# Patient Record
Sex: Female | Born: 1965 | Race: White | Hispanic: No | Marital: Married | Smoking: Former smoker
Health system: Southern US, Community
[De-identification: ages and names within clinical notes are randomized; demographics above are authoritative.]

## PROBLEM LIST (undated history)

## (undated) DIAGNOSIS — M1990 Unspecified osteoarthritis, unspecified site: Secondary | ICD-10-CM

## (undated) DIAGNOSIS — Z8679 Personal history of other diseases of the circulatory system: Secondary | ICD-10-CM

## (undated) DIAGNOSIS — Z803 Family history of malignant neoplasm of breast: Secondary | ICD-10-CM

## (undated) DIAGNOSIS — Z9889 Other specified postprocedural states: Secondary | ICD-10-CM

## (undated) DIAGNOSIS — F32A Depression, unspecified: Secondary | ICD-10-CM

## (undated) DIAGNOSIS — F329 Major depressive disorder, single episode, unspecified: Secondary | ICD-10-CM

## (undated) DIAGNOSIS — J45909 Unspecified asthma, uncomplicated: Secondary | ICD-10-CM

## (undated) DIAGNOSIS — A630 Anogenital (venereal) warts: Secondary | ICD-10-CM

## (undated) DIAGNOSIS — Z87891 Personal history of nicotine dependence: Secondary | ICD-10-CM

## (undated) DIAGNOSIS — N879 Dysplasia of cervix uteri, unspecified: Secondary | ICD-10-CM

## (undated) DIAGNOSIS — I10 Essential (primary) hypertension: Secondary | ICD-10-CM

## (undated) DIAGNOSIS — F419 Anxiety disorder, unspecified: Secondary | ICD-10-CM

## (undated) DIAGNOSIS — K219 Gastro-esophageal reflux disease without esophagitis: Secondary | ICD-10-CM

## (undated) DIAGNOSIS — Z923 Personal history of irradiation: Secondary | ICD-10-CM

## (undated) DIAGNOSIS — R112 Nausea with vomiting, unspecified: Secondary | ICD-10-CM

## (undated) HISTORY — DX: Depression, unspecified: F32.A

## (undated) HISTORY — DX: Dysplasia of cervix uteri, unspecified: N87.9

## (undated) HISTORY — DX: Family history of malignant neoplasm of breast: Z80.3

## (undated) HISTORY — DX: Gastro-esophageal reflux disease without esophagitis: K21.9

## (undated) HISTORY — PX: KNEE SURGERY: SHX244

## (undated) HISTORY — DX: Anogenital (venereal) warts: A63.0

## (undated) HISTORY — DX: Major depressive disorder, single episode, unspecified: F32.9

## (undated) HISTORY — PX: TUBAL LIGATION: SHX77

## (undated) HISTORY — DX: Personal history of nicotine dependence: Z87.891

## (undated) HISTORY — PX: JOINT REPLACEMENT: SHX530

---

## 1988-06-18 DIAGNOSIS — N879 Dysplasia of cervix uteri, unspecified: Secondary | ICD-10-CM

## 1988-06-18 HISTORY — DX: Dysplasia of cervix uteri, unspecified: N87.9

## 1988-06-18 HISTORY — PX: CERVIX LESION DESTRUCTION: SHX591

## 1998-10-19 ENCOUNTER — Encounter: Payer: Self-pay | Admitting: Obstetrics and Gynecology

## 1998-10-19 ENCOUNTER — Ambulatory Visit (HOSPITAL_COMMUNITY): Admission: RE | Admit: 1998-10-19 | Discharge: 1998-10-19 | Payer: Self-pay | Admitting: *Deleted

## 1999-02-23 ENCOUNTER — Encounter (HOSPITAL_COMMUNITY): Admission: RE | Admit: 1999-02-23 | Discharge: 1999-03-31 | Payer: Self-pay | Admitting: Obstetrics and Gynecology

## 1999-03-07 ENCOUNTER — Encounter: Payer: Self-pay | Admitting: Obstetrics and Gynecology

## 1999-03-07 ENCOUNTER — Ambulatory Visit (HOSPITAL_COMMUNITY): Admission: RE | Admit: 1999-03-07 | Discharge: 1999-03-07 | Payer: Self-pay | Admitting: Obstetrics and Gynecology

## 1999-03-30 ENCOUNTER — Inpatient Hospital Stay (HOSPITAL_COMMUNITY): Admission: AD | Admit: 1999-03-30 | Discharge: 1999-04-01 | Payer: Self-pay | Admitting: Obstetrics and Gynecology

## 1999-03-30 ENCOUNTER — Encounter (INDEPENDENT_AMBULATORY_CARE_PROVIDER_SITE_OTHER): Payer: Self-pay

## 2000-01-04 ENCOUNTER — Other Ambulatory Visit: Admission: RE | Admit: 2000-01-04 | Discharge: 2000-01-04 | Payer: Self-pay | Admitting: Obstetrics and Gynecology

## 2007-07-07 ENCOUNTER — Ambulatory Visit: Payer: Self-pay | Admitting: Cardiology

## 2007-07-11 ENCOUNTER — Ambulatory Visit: Payer: Self-pay | Admitting: Cardiology

## 2008-06-26 LAB — HM PAP SMEAR: HM Pap smear: NEGATIVE

## 2009-11-18 ENCOUNTER — Encounter: Admission: RE | Admit: 2009-11-18 | Discharge: 2009-11-18 | Payer: Self-pay | Admitting: Family Medicine

## 2010-10-10 DIAGNOSIS — F411 Generalized anxiety disorder: Secondary | ICD-10-CM | POA: Insufficient documentation

## 2010-10-11 DIAGNOSIS — N946 Dysmenorrhea, unspecified: Secondary | ICD-10-CM | POA: Insufficient documentation

## 2010-10-23 DIAGNOSIS — F339 Major depressive disorder, recurrent, unspecified: Secondary | ICD-10-CM | POA: Insufficient documentation

## 2010-10-31 NOTE — Letter (Signed)
July 07, 2007    Tammy R. Collins Scotland, M.D.  [address], [place] [address]   RE:  RUDIE, RIKARD  MRN:  [id number]  /  DOB:  12-19-1965   Dear Dr. Collins Scotland:   Thank you for your referral of Ms. Monia Pouch.  As you know she is a pleasant  45 year old woman with a history of anxiety and gastroesophageal reflux  disease.  She has an intermittent history of palpitations.  She reports  that back in October of 2007 she began to experience episodes of racing  heart beat, as if her heart was jumping out of her chest.  This would  be associated often with a flushed tingling sensation, anxiety and  sometimes dizziness, although she has never had any syncope associated  with it.  Sometimes she will feel a sharp chest pain and burning  feeling in her chest as well.  She was treated for anxiety initially  back in 2007 and continues on Xanax and paroxetine.  She has had  recurrence of symptoms beginning back in October 2008 of similar  character and quality.  Her electrocardiogram today shows sinus rhythm  with nonspecific T-wave changes.  She has an incomplete right bundle  branch block pattern in V1 and V2.  PR interval is normal at 136  milliseconds.  There is no family history of sudden cardiac death or  dysrhythmia.   ALLERGIES:  ASPIRIN AND CODEINE.   PRESENT MEDICATIONS:  1. Xanax 0.25 mg p.o. t.i.d.  2. Paxil 20 mg p.o. daily.  3,  Zegerid 40 mg p.o. daily.   REVIEW OF SYSTEMS:  Discussed above.  Otherwise systems are negative.   Ms. Lasser states that her father developed heart disease in his 47s as  well as diabetes mellitus.  The patient's mother recently underwent  stent placement presumably due to cardiovascular disease at age 65.  She has a brother and a sister, both described as healthy.   SOCIAL HISTORY:  Finds that Ms. Salahuddin is married, has 2 children.  She  smokes up to a half pack per day tobacco and has done so since age 33.  She denies any illicit substance use.   Does drink alcohol occasionally.  She has no regular exercise regimen.  She works in Engineering geologist.   EXAMINATION:  Ms. Terada is comfortable in no acute distress.  She weighs  170 pounds, blood pressure 133/85, heart rate 67.  HEENT:  Conjunctiva, lids normal.  oropharynx is clear.  NECK:  Supple.  No elevated jugular venous pressure, no loud bruits, no  thyromegaly or thyroid tenderness.  LUNGS:  Clear without labored breathing.  CARDIAC:  Exam reveals a regular rate and rhythm, No S3 or S4 gallop, no  loud murmur, no midsystolic click.  ABDOMEN:  Soft, nontender, normoactive bowel sounds.  EXTREMITIES:  Exhibit no pitting edema, distal pulses are 2+.  SKIN:  Warm and dry.  MUSCULOSKELETAL:  No kyphosis noted.  NEUROPSYCHIATRIC:  Patient is alert x3.  Affect is normal.   IMPRESSION AND RECOMMENDATIONS:  Ms. Vandemark is a pleasant 45 year old  woman with intermittent palpitations as outlined above.  The possibility  of paroxysmal supraventricular tachycardia is considered, although I  have no objective evidence of this at the present time.  Her  electrocardiogram shows normal intervals and no obvious evidence of pre-  excitation.  There is an incomplete right bundle branch block pattern  which may be of no major clinical significance.  We discussed using a 30-  day event recorder to try to document if she is in fact having any  rhythm change or not with her associated symptoms.  She reports having  spells approximately once every 1-2 weeks at this time.  I have not  added any new medications at this point and will plan to see her back in  1 month's time to review the monitor.  We can then proceed from there.    Sincerely,      Jonelle Sidle, MD  Electronically Signed    SGM/MedQ  DD: 07/07/2007  DT: 07/07/2007  Job #: [id number]

## 2011-11-08 ENCOUNTER — Ambulatory Visit (INDEPENDENT_AMBULATORY_CARE_PROVIDER_SITE_OTHER): Payer: Private Health Insurance - Indemnity | Admitting: Family Medicine

## 2011-11-08 ENCOUNTER — Ambulatory Visit: Payer: Private Health Insurance - Indemnity

## 2011-11-08 VITALS — BP 118/72 | HR 96 | Temp 98.2°F | Resp 18 | Ht 68.5 in | Wt 201.0 lb

## 2011-11-08 DIAGNOSIS — M25569 Pain in unspecified knee: Secondary | ICD-10-CM

## 2011-11-08 MED ORDER — TRAMADOL HCL 50 MG PO TABS: 50.0000 mg | ORAL_TABLET | Freq: Three times a day (TID) | ORAL | Status: AC | PRN

## 2011-11-08 MED ORDER — MELOXICAM 7.5 MG PO TABS: 7.5000 mg | ORAL_TABLET | Freq: Two times a day (BID) | ORAL | Status: DC

## 2011-11-08 NOTE — Progress Notes (Signed)
  Subjective:    Patient ID: Teresa Dunn, female    DOB: June 15, 1966, 45 y.o.   MRN: [id number]  HPI  Patient presents complaining of (L) knee pain that started early this morning. Very painful to weight bear. No known injury though was dancing last night.  Review of Systems     Objective:   Physical Exam  Constitutional: She appears well-developed.  Cardiovascular: Normal rate, regular rhythm and normal heart sounds.   Pulmonary/Chest: Effort normal and breath sounds normal.  Musculoskeletal:       Left knee: She exhibits no MCL laxity. tenderness found. Medial joint line and MCL tenderness noted. No lateral joint line, no LCL and no patellar tendon tenderness noted.        Assessment & Plan:  (L) knee MCL sprain; question meniscal involvement  See medications on AVS Patient states she has crutches at home; encouraged her to bring them in so we can adjust. Follow up in 10-14 days Work note provided

## 2012-06-26 ENCOUNTER — Encounter: Payer: Self-pay | Admitting: Family Medicine

## 2012-06-26 ENCOUNTER — Ambulatory Visit (INDEPENDENT_AMBULATORY_CARE_PROVIDER_SITE_OTHER): Payer: BC Managed Care – PPO | Admitting: Family Medicine

## 2012-06-26 VITALS — BP 140/90 | HR 72 | Temp 98.4°F | Resp 12 | Wt 209.0 lb

## 2012-06-26 DIAGNOSIS — R519 Headache, unspecified: Secondary | ICD-10-CM

## 2012-06-26 DIAGNOSIS — K219 Gastro-esophageal reflux disease without esophagitis: Secondary | ICD-10-CM | POA: Insufficient documentation

## 2012-06-26 DIAGNOSIS — Z8659 Personal history of other mental and behavioral disorders: Secondary | ICD-10-CM

## 2012-06-26 DIAGNOSIS — R51 Headache: Secondary | ICD-10-CM

## 2012-06-26 MED ORDER — NORTRIPTYLINE HCL 10 MG PO CAPS
10.0000 mg | ORAL_CAPSULE | Freq: Every day | ORAL | Status: DC
Start: 2012-06-26 — End: 2012-08-07

## 2012-06-26 NOTE — Progress Notes (Signed)
  Subjective:    Patient ID: Teresa Dunn, female    DOB: Dec 11, 1965, 47 y.o.   MRN: [id number]  HPI  New patient to establish care. Past medical history reviewed. Past history of GERD, depression, reported genital warts. Major issue is that she's had chronic headaches. She has headaches which appear to be a more of a mixed type. She has called these "migraines" but these are frequently bifrontal and spread occipitally. Occasional left parietal pains. Saw another provider and had MRI scan reportedly 2 months ago normal. Generally has chronic headaches 28 days out of the month. These are relatively constant. Quality is sharp to throbbing at times. Occasional nausea without vomiting. Occasional light sensitivity. Ibuprofen helps. Taking almost daily ibuprofen. Minimal caffeine use.  No clear triggers. Generally poor sleep quality. No other alleviating factors. No other clear exacerbating factors. No associated fever. No night sweats. No focal neurologic symptoms. No visual changes.  Past Medical History  Diagnosis Date  . Cancer 1988    cervical  . Depression   . Chronic headaches   . GERD (gastroesophageal reflux disease)   . Genital warts    Past Surgical History  Procedure Date  . Tubal ligation     reports that she quit smoking about 4 months ago. Her smoking use included Cigarettes. She has a 30 pack-year smoking history. She does not have any smokeless tobacco history on file. Her alcohol and drug histories not on file. family history includes Cancer in her maternal grandmother; Diabetes in her father and mother; Heart disease in her father and mother; Hypertension in her father and mother; Mental illness in her mother; and Stroke in her maternal grandmother. Allergies  Allergen Reactions  . Codeine Nausea And Vomiting  . Asa (Aspirin) Other (See Comments)    wheezing      Review of Systems  Constitutional: Negative for fever, chills, diaphoresis, activity change and unexpected  weight change.  HENT: Negative for congestion and sinus pressure.   Respiratory: Negative for cough and shortness of breath.   Cardiovascular: Negative for chest pain.  Gastrointestinal: Negative for abdominal pain.  Genitourinary: Negative for dysuria.  Neurological: Positive for headaches.  Psychiatric/Behavioral: Negative for confusion.       Objective:   Physical Exam  Constitutional: She is oriented to person, place, and time. She appears well-developed and well-nourished.  HENT:  Right Ear: External ear normal.  Left Ear: External ear normal.  Mouth/Throat: Oropharynx is clear and moist.  Eyes: Pupils are equal, round, and reactive to light.  Neck: Neck supple. No thyromegaly present.  Cardiovascular: Normal rate and regular rhythm.   Pulmonary/Chest: Effort normal and breath sounds normal. No respiratory distress. She has no wheezes. She has no rales.  Neurological: She is alert and oriented to person, place, and time. No cranial nerve deficit.          Assessment & Plan:  Chronic headaches. These appear to be a mixed type. Avoid regular use of over-the-counter analgesics. Continue low caffeine intake. Trial of nortriptyline 10 mg each bedtime and consider increasing to 20 mg after one week if no improvement. Reassess in one month.

## 2012-06-26 NOTE — Patient Instructions (Signed)
Migraine Headache A migraine headache is an intense, throbbing pain on one or both sides of your head. A migraine can last for 30 minutes to several hours. CAUSES  The exact cause of a migraine headache is not always known. However, a migraine may be caused when nerves in the brain become irritated and release chemicals that cause inflammation. This causes pain. SYMPTOMS  Pain on one or both sides of your head.  Pulsating or throbbing pain.  Severe pain that prevents daily activities.  Pain that is aggravated by any physical activity.  Nausea, vomiting, or both.  Dizziness.  Pain with exposure to bright lights, loud noises, or activity.  General sensitivity to bright lights, loud noises, or smells. Before you get a migraine, you may get warning signs that a migraine is coming (aura). An aura may include:  Seeing flashing lights.  Seeing bright spots, halos, or zig-zag lines.  Having tunnel vision or blurred vision.  Having feelings of numbness or tingling.  Having trouble talking.  Having muscle weakness. MIGRAINE TRIGGERS  Alcohol.  Smoking.  Stress.  Menstruation.  Aged cheeses.  Foods or drinks that contain nitrates, glutamate, aspartame, or tyramine.  Lack of sleep.  Chocolate.  Caffeine.  Hunger.  Physical exertion.  Fatigue.  Medicines used to treat chest pain (nitroglycerine), birth control pills, estrogen, and some blood pressure medicines. DIAGNOSIS  A migraine headache is often diagnosed based on:  Symptoms.  Physical examination.  A CT scan or MRI of your head. TREATMENT Medicines may be given for pain and nausea. Medicines can also be given to help prevent recurrent migraines.  HOME CARE INSTRUCTIONS  Only take over-the-counter or prescription medicines for pain or discomfort as directed by your caregiver. The use of long-term narcotics is not recommended.  Lie down in a dark, quiet room when you have a migraine.  Keep a journal  to find out what may trigger your migraine headaches. For example, write down:  What you eat and drink.  How much sleep you get.  Any change to your diet or medicines.  Limit alcohol consumption.  Quit smoking if you smoke.  Get 7 to 9 hours of sleep, or as recommended by your caregiver.  Limit stress.  Keep lights dim if bright lights bother you and make your migraines worse. SEEK IMMEDIATE MEDICAL CARE IF:   Your migraine becomes severe.  You have a fever.  You have a stiff neck.  You have vision loss.  You have muscular weakness or loss of muscle control.  You start losing your balance or have trouble walking.  You feel faint or pass out.  You have severe symptoms that are different from your first symptoms. MAKE SURE YOU:   Understand these instructions.  Will watch your condition.  Will get help right away if you are not doing well or get worse. Document Released: 06/04/2005 Document Revised: 08/27/2011 Document Reviewed: 05/25/2011 [facility] Patient Information 2013 [place], [place].  Try to avoid regular use of Ibuprofen Consider titrate nortriptyline up to 2 capsules at night in 4-5 days if not seeing improvement.

## 2012-07-31 ENCOUNTER — Ambulatory Visit: Payer: BC Managed Care – PPO | Admitting: Family Medicine

## 2012-08-07 ENCOUNTER — Ambulatory Visit (INDEPENDENT_AMBULATORY_CARE_PROVIDER_SITE_OTHER): Payer: BC Managed Care – PPO | Admitting: Family Medicine

## 2012-08-07 ENCOUNTER — Encounter: Payer: Self-pay | Admitting: Family Medicine

## 2012-08-07 VITALS — BP 158/84 | Temp 97.9°F | Wt 209.0 lb

## 2012-08-07 DIAGNOSIS — I10 Essential (primary) hypertension: Secondary | ICD-10-CM

## 2012-08-07 DIAGNOSIS — J309 Allergic rhinitis, unspecified: Secondary | ICD-10-CM

## 2012-08-07 DIAGNOSIS — G4489 Other headache syndrome: Secondary | ICD-10-CM | POA: Insufficient documentation

## 2012-08-07 DIAGNOSIS — J3089 Other allergic rhinitis: Secondary | ICD-10-CM | POA: Insufficient documentation

## 2012-08-07 MED ORDER — NORTRIPTYLINE HCL 10 MG PO CAPS: ORAL_CAPSULE | ORAL | Status: DC

## 2012-08-07 MED ORDER — HYDROCHLOROTHIAZIDE 25 MG PO TABS: ORAL_TABLET | ORAL | Status: DC

## 2012-08-07 MED ORDER — MONTELUKAST SODIUM 10 MG PO TABS: 10.0000 mg | ORAL_TABLET | Freq: Every day | ORAL | Status: DC

## 2012-08-07 NOTE — Progress Notes (Signed)
  Subjective:    Patient ID: Teresa Dunn, female    DOB: January 24, 1966, 47 y.o.   MRN: [id number]  HPI Patient is seen for followup regarding mixed type headache. We started low dose Pamelor 20 mg at night and her headaches have completely resolved with this. She is extremely happy with results. She does have some mild dry mouth otherwise no side effects. Her sleep has improved.  She requests refills of Singulair which she takes for allergies. Allergies have been well controlled  Blood pressure consistently elevated over the past couple months. 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 Usually around 170/90. No chest pains. No dizziness. Never treated for hypertension. No regular nonsteroidal use. No regular alcohol use. Strong family history of hypertension both parents  Past Medical History  Diagnosis Date  . Cancer 1988    cervical  . Depression   . Chronic headaches   . GERD (gastroesophageal reflux disease)   . Genital warts    Past Surgical History  Procedure Laterality Date  . Tubal ligation      reports that she quit smoking about 5 months ago. Her smoking use included Cigarettes. She has a 30 pack-year smoking history. She does not have any smokeless tobacco history on file. Her alcohol and drug histories are not on file. family history includes Cancer in her maternal grandmother; Diabetes in her father and mother; Heart disease in her father and mother; Hypertension in her father and mother; Mental illness in her mother; and Stroke in her maternal grandmother. Allergies  Allergen Reactions  . Codeine Nausea And Vomiting  . Asa (Aspirin) Other (See Comments)    wheezing      Review of Systems  Constitutional: Negative for appetite change, fatigue and unexpected weight change.  Eyes: Negative for visual disturbance.  Respiratory: Negative for cough, chest tightness, shortness of breath and wheezing.   Cardiovascular: Negative for  chest pain, palpitations and leg swelling.  Neurological: Negative for dizziness, seizures, syncope, weakness, light-headedness and headaches.       Objective:   Physical Exam  Constitutional: She appears well-developed and well-nourished.  Neck: Neck supple. No thyromegaly present.  Cardiovascular: Normal rate and regular rhythm.   Pulmonary/Chest: Effort normal and breath sounds normal. No respiratory distress. She has no wheezes. She has no rales.  Musculoskeletal: She exhibits no edema.          Assessment & Plan:  #1 chronic headaches. Mixed type. Greatly improved with Pamelor.  We discussed continuing this for another couple months then consider slow taper #2 hypertension. Currently untreated. Discussed lifestyle management. Start low-dose HCTZ 10/12.5 mg daily and reassess blood pressure one month. #3 perennial allergies. Controlled. Refill Singulair for one year

## 2012-08-07 NOTE — Patient Instructions (Addendum)

## 2012-09-04 ENCOUNTER — Ambulatory Visit: Payer: BC Managed Care – PPO | Admitting: Family Medicine

## 2012-09-09 ENCOUNTER — Encounter: Payer: Self-pay | Admitting: Family Medicine

## 2012-09-09 ENCOUNTER — Ambulatory Visit (INDEPENDENT_AMBULATORY_CARE_PROVIDER_SITE_OTHER): Payer: BC Managed Care – PPO | Admitting: Family Medicine

## 2012-09-09 VITALS — BP 122/78 | Temp 98.0°F | Wt 208.0 lb

## 2012-09-09 DIAGNOSIS — I10 Essential (primary) hypertension: Secondary | ICD-10-CM

## 2012-09-09 DIAGNOSIS — Z8659 Personal history of other mental and behavioral disorders: Secondary | ICD-10-CM

## 2012-09-09 LAB — BASIC METABOLIC PANEL
Calcium: 9.4 mg/dL (ref 8.4–10.5)
Creatinine, Ser: 0.6 mg/dL (ref 0.4–1.2)
GFR: 106.45 mL/min (ref >60.00)
Sodium: 137 mEq/L (ref 135–145)

## 2012-09-09 MED ORDER — DULOXETINE HCL 60 MG PO CPEP: 60.0000 mg | ORAL_CAPSULE | Freq: Every day | ORAL | Status: DC

## 2012-09-09 MED ORDER — DULOXETINE HCL 30 MG PO CPEP: 30.0000 mg | ORAL_CAPSULE | Freq: Every day | ORAL | Status: DC

## 2012-09-09 MED ORDER — HYDROCHLOROTHIAZIDE 25 MG PO TABS: ORAL_TABLET | ORAL | Status: DC

## 2012-09-09 NOTE — Progress Notes (Signed)
  Subjective:    Patient ID: Teresa Dunn, female    DOB: 05-23-1966, 47 y.o.   MRN: [id number]  HPI Followup hypertension. Added HCTZ 12.5 mg daily. Blood pressure improved but still some elevations and she titrated this to 25 mg daily Blood pressures have been very well controlled over the past couple of weeks. No side effects. No further headaches.  Prior history of anxiety and depression. She's had some mild recurrent symptoms recently. Responded very well to Cymbalta previously and is requesting going back on this.  Past Medical History  Diagnosis Date  . Cancer 1988    cervical  . Depression   . Chronic headaches   . GERD (gastroesophageal reflux disease)   . Genital warts    Past Surgical History  Procedure Laterality Date  . Tubal ligation      reports that she quit smoking about 6 months ago. Her smoking use included Cigarettes. She has a 30 pack-year smoking history. She does not have any smokeless tobacco history on file. Her alcohol and drug histories are not on file. family history includes Cancer in her maternal grandmother; Diabetes in her father and mother; Heart disease in her father and mother; Hypertension in her father and mother; Mental illness in her mother; and Stroke in her maternal grandmother. Allergies  Allergen Reactions  . Codeine Nausea And Vomiting  . Asa (Aspirin) Other (See Comments)    wheezing      Review of Systems  Constitutional: Negative for fatigue.  Eyes: Negative for visual disturbance.  Respiratory: Negative for cough, chest tightness, shortness of breath and wheezing.   Cardiovascular: Negative for chest pain, palpitations and leg swelling.  Neurological: Negative for dizziness, seizures, syncope, weakness, light-headedness and headaches.       Objective:   Physical Exam  Constitutional: She is oriented to person, place, and time. She appears well-developed and well-nourished.  Neck: Neck supple. No thyromegaly present.   Cardiovascular: Normal rate and regular rhythm.   Pulmonary/Chest: Effort normal and breath sounds normal. No respiratory distress. She has no wheezes. She has no rales.  Musculoskeletal: She exhibits no edema.  Neurological: She is alert and oriented to person, place, and time.  Psychiatric: She has a normal mood and affect. Her behavior is normal. Judgment normal.          Assessment & Plan:  #1 hypertension. Improved. Check basic metabolic panel. High potassium diet. #2 history of recurrent depression. Patient requests going back on Cymbalta. Start 30 mg daily for 2 weeks then titrate to 60 mg daily.

## 2012-09-09 NOTE — Patient Instructions (Signed)
Foods Rich in Potassium Food / Potassium (mg)  Apricots, dried,  cup / 378 mg   Apricots, raw, 1 cup halves / 401 mg   Avocado,  / 487 mg   Banana, 1 large / 487 mg   Beef, lean, round, 3 oz / 202 mg   Cantaloupe, 1 cup cubes / 427 mg   Dates, medjool, 5 whole / 835 mg   Ham, cured, 3 oz / 212 mg   Lentils, dried,  cup / 458 mg   Lima beans, frozen,  cup / 258 mg   Orange, 1 large / 333 mg   Orange juice, 1 cup / 443 mg   Peaches, dried,  cup / 398 mg   Peas, split, cooked,  cup / 355 mg   Potato, boiled, 1 medium / 515 mg   Prunes, dried, uncooked,  cup / 318 mg   Raisins,  cup / 309 mg   Salmon, pink, raw, 3 oz / 275 mg   Sardines, canned , 3 oz / 338 mg   Tomato, raw, 1 medium / 292 mg   Tomato juice, 6 oz / 417 mg   Turkey, 3 oz / 349 mg  Document Released: 06/04/2005 Document Revised: 02/14/2011 Document Reviewed: 10/18/2008 [employer] Patient Information 2012 [employer]. 

## 2012-09-10 NOTE — Progress Notes (Signed)
Quick Note:  Pt informed on personally identified VM ______ 

## 2013-02-15 ENCOUNTER — Other Ambulatory Visit: Payer: Self-pay | Admitting: Family Medicine

## 2014-01-28 ENCOUNTER — Other Ambulatory Visit: Payer: Self-pay | Admitting: Family Medicine

## 2014-01-28 DIAGNOSIS — R109 Unspecified abdominal pain: Secondary | ICD-10-CM

## 2014-01-29 ENCOUNTER — Ambulatory Visit
Admission: RE | Admit: 2014-01-29 | Discharge: 2014-01-29 | Disposition: A | Discharge status: Home / Self Care | Payer: 59 | Source: Ambulatory Visit | Attending: Family Medicine | Admitting: Family Medicine

## 2014-01-29 DIAGNOSIS — R109 Unspecified abdominal pain: Secondary | ICD-10-CM

## 2014-09-24 ENCOUNTER — Ambulatory Visit: Payer: Self-pay | Admitting: Orthopedic Surgery

## 2014-11-14 ENCOUNTER — Ambulatory Visit: Payer: Self-pay | Admitting: Orthopedic Surgery

## 2014-11-14 NOTE — H&P (Signed)
Teresa Dunn is an 49 y.o. female.   Chief Complaint: L shoulder pain HPI: The patient is a 49 year old female who presents today for follow up of their shoulder. The patient is being followed for their left shoulder pain. They are 7 year(s) out from when symptoms began. Symptoms reported today include: pain and popping. and report their pain level to be moderate to severe. Current treatment includes: activity modification and NSAIDs (Ibuprofen). The following medication has been used for pain control: Ultram (prn). The patient presents today following MRI. Note for "Shoulder complaints": Taylormarie follows up for her L shoulder to review MRI. She reports ongoing pain left shoulder, no better since her last visit. She experiences the most pain with overhead activity, abduction, and internal rotation. She does report some pain in the [facility] joint as well as subacromial and into the deltoid (worst in the deltoid region). She is taking ultram as needed for pain. She had a steroid injection done by her PCP at the beginning of March with zero relief. Last steroid injection in late March at our office with no significant long term relief.   Past Medical History  Diagnosis Date  . Cancer 1988    cervical  . Depression   . Chronic headaches   . GERD (gastroesophageal reflux disease)   . Genital warts     Past Surgical History  Procedure Laterality Date  . Tubal ligation      Family History  Problem Relation Age of Onset  . Heart disease Mother   . Hypertension Mother   . Mental illness Mother   . Diabetes Mother   . Heart disease Father   . Hypertension Father   . Diabetes Father   . Cancer Maternal Grandmother   . Stroke Maternal Grandmother    Social History:  reports that she quit smoking about 2 years ago. Her smoking use included Cigarettes. She has a 30 pack-year smoking history. She does not have any smokeless tobacco history on file. Her alcohol and drug histories are not on  file.  Allergies:  Allergies  Allergen Reactions  . Codeine Nausea And Vomiting  . Asa [Aspirin] Other (See Comments)    wheezing     (Not in a hospital admission)  No results found for this or any previous visit (from the past 48 hour(s)). No results found.  Review of Systems  Constitutional: Negative.   HENT: Negative.   Eyes: Negative.   Respiratory: Negative.   Cardiovascular: Negative.   Gastrointestinal: Negative.   Genitourinary: Negative.   Musculoskeletal: Positive for joint pain.  Skin: Negative.   Neurological: Negative.     There were no vitals taken for this visit. Physical Exam  Constitutional: She is oriented to person, place, and time. She appears well-developed and well-nourished.  HENT:  Head: Normocephalic and atraumatic.  Eyes: Pupils are equal, round, and reactive to light.  Neck: Normal range of motion. Neck supple.  Cardiovascular: Normal rate and regular rhythm.   Respiratory: Effort normal.  GI: Soft.  Musculoskeletal:  Left Upper Extremity: Left Shoulder: Inspection and Palpation - Tenderness - AC joint tender to palpation(mild) and subacromial space tender to palpation, no tenderness to palpation of the Loda joint, no tenderness to palpation of the clavicle, no tenderness to palpation of the trapezius. Swelling - none. Tissue tension/texture is - soft. Sensation - intact to light touch. Skin - Color - no ecchymosis, no erythema. Left Shoulder - ROM - Full shoulder ROM in all planes of  motion. Strength and Tone - Abduction - painful. Internal Rotation - 5/5. External Rotation - 4+/5 and painful. Left Shoulder - Instability - sulcus sign negative. Impingement - impingement sign positive and secondary impingement sign positive. Deformities/Malalignments/Discrepancies - no deformities noted. Special Testing - AC crossover adduction test negative, Speed's test negative.   Neurological: She is alert and oriented to person, place, and time. She has normal  reflexes.    Prior xrays reviewed, type 1 acromion, AC arthrosis.  MRI images and report reviewed today by Dr. Tonita Cong with rotator cuff tendinosis without tear. Moderate AC arthrosis  Assessment/Plan L shoulder impingement, AC arthrosis  Pt with ongoing L shoulder pain refractory to injections, medications, activity modifications, relative rest, ongoing impingement symptoms interfering with activity. Discussed other options, have decided to proceed with L shoulder arthroscopy, SAD, labral debridement, mini-open DCR. Dr. Tonita Cong discussed the procedure itself with pt as well as risks, complications and alternatives including but not limited to DVT, PE, infx, bleeding, failure of procedure, need for secondary procedure, anesthesia risk, even death. Discussed post-op protocols and recovery.  Plan L shoulder arthroscopy, SAD, labral debridement, mini-open DCR  BISSELL, JACLYN M. PA-C for Dr. Tonita Cong 11/14/2014, 11:29 AM

## 2014-11-16 ENCOUNTER — Encounter (HOSPITAL_COMMUNITY): Payer: Self-pay

## 2014-11-16 ENCOUNTER — Encounter (HOSPITAL_COMMUNITY)
Admission: RE | Admit: 2014-11-16 | Discharge: 2014-11-16 | Disposition: A | Discharge status: Home / Self Care | Payer: Managed Care, Other (non HMO) | Source: Ambulatory Visit | Attending: Specialist | Admitting: Specialist

## 2014-11-16 DIAGNOSIS — S43402A Unspecified sprain of left shoulder joint, initial encounter: Secondary | ICD-10-CM | POA: Diagnosis not present

## 2014-11-16 DIAGNOSIS — F329 Major depressive disorder, single episode, unspecified: Secondary | ICD-10-CM | POA: Diagnosis not present

## 2014-11-16 DIAGNOSIS — K219 Gastro-esophageal reflux disease without esophagitis: Secondary | ICD-10-CM | POA: Diagnosis not present

## 2014-11-16 DIAGNOSIS — Z87891 Personal history of nicotine dependence: Secondary | ICD-10-CM | POA: Diagnosis not present

## 2014-11-16 DIAGNOSIS — M19012 Primary osteoarthritis, left shoulder: Secondary | ICD-10-CM | POA: Diagnosis not present

## 2014-11-16 DIAGNOSIS — M7542 Impingement syndrome of left shoulder: Secondary | ICD-10-CM | POA: Diagnosis present

## 2014-11-16 DIAGNOSIS — Z9851 Tubal ligation status: Secondary | ICD-10-CM | POA: Diagnosis not present

## 2014-11-16 DIAGNOSIS — Z886 Allergy status to analgesic agent status: Secondary | ICD-10-CM | POA: Diagnosis not present

## 2014-11-16 HISTORY — DX: Essential (primary) hypertension: I10

## 2014-11-16 HISTORY — DX: Unspecified asthma, uncomplicated: J45.909

## 2014-11-16 HISTORY — DX: Unspecified osteoarthritis, unspecified site: M19.90

## 2014-11-16 HISTORY — DX: Anxiety disorder, unspecified: F41.9

## 2014-11-16 LAB — BASIC METABOLIC PANEL
ANION GAP: 9 (ref 5–15)
BUN: 23 mg/dL — ABNORMAL HIGH (ref 6–20)
CALCIUM: 9.3 mg/dL (ref 8.9–10.3)
CO2: 24 mmol/L (ref 22–32)
Chloride: 106 mmol/L (ref 101–111)
Creatinine, Ser: 0.73 mg/dL (ref 0.44–1.00)
GFR calc Af Amer: >60 mL/min (ref >60)
GFR calc non Af Amer: >60 mL/min (ref >60)
Glucose, Bld: 103 mg/dL — ABNORMAL HIGH (ref 65–99)
Potassium: 4.1 mmol/L (ref 3.5–5.1)
SODIUM: 139 mmol/L (ref 135–145)

## 2014-11-16 LAB — CBC
HCT: 38 % (ref 36.0–46.0)
Hemoglobin: 12.5 g/dL (ref 12.0–15.0)
MCH: 30.7 pg (ref 26.0–34.0)
MCHC: 32.9 g/dL (ref 30.0–36.0)
MCV: 93.4 fL (ref 78.0–100.0)
PLATELETS: 304 10*3/uL (ref 150–400)
RBC: 4.07 MIL/uL (ref 3.87–5.11)
RDW: 12.9 % (ref 11.5–15.5)
WBC: 6.1 10*3/uL (ref 4.0–10.5)

## 2014-11-16 LAB — HCG, SERUM, QUALITATIVE: Preg, Serum: NEGATIVE

## 2014-11-16 NOTE — Patient Instructions (Addendum)
Teresa Dunn  11/16/2014   Your procedure is scheduled on:  Thursday November 18, 2014  Report to [facility] Main  Entrance and follow signs to               [place] arrive at [place] AM.  Call this number if you have problems the morning of surgery [phone]   Remember: ONLY 1 PERSON MAY GO WITH YOU TO SHORT STAY TO GET  READY MORNING OF [place].  Do not eat food or drink liquids :After Midnight.     Take these medicines the morning of surgery with A SIP OF WATER: Dexlansoprazole (Dexilant); Nasacort if needed (bring with you day of surgery); Symbicort Inhaler (bring with you day of surgery); Albuterol Inhaler (Ventolin) if needed (bring with you day of surgery)                                You may not have any metal on your body including hair pins and              piercings  Do not wear jewelry, make-up, lotions, powders or perfumes, deodorant             Do not wear nail polish.  Do not shave  48 hours prior to surgery.              Do not bring valuables to the hospital. [place].  Contacts, dentures or bridgework may not be worn into surgery.  Leave suitcase in the car. After surgery it may be brought to your room.     _____________________________________________________________________             [facility] - Preparing for Surgery Before surgery, you can play an important role.  Because skin is not sterile, your skin needs to be as free of germs as possible.  You can reduce the number of germs on your skin by washing with CHG (chlorahexidine gluconate) soap before surgery.  CHG is an antiseptic cleaner which kills germs and bonds with the skin to continue killing germs even after washing. Please DO NOT use if you have an allergy to CHG or antibacterial soaps.  If your skin becomes reddened/irritated stop using the CHG and inform your nurse when you arrive at Short Stay. Do not shave  (including legs and underarms) for at least 48 hours prior to the first CHG shower.  You may shave your face/neck. Please follow these instructions carefully:  1.  Shower with CHG Soap the night before surgery and the  morning of Surgery.  2.  If you choose to wash your hair, wash your hair first as usual with your  normal  shampoo.  3.  After you shampoo, rinse your hair and body thoroughly to remove the  shampoo.                           4.  Use CHG as you would any other liquid soap.  You can apply chg directly  to the skin and wash                       Gently with a scrungie  or clean washcloth.  5.  Apply the CHG Soap to your body ONLY FROM THE NECK DOWN.   Do not use on face/ open                           Wound or open sores. Avoid contact with eyes, ears mouth and genitals (private parts).                       Wash face,  Genitals (private parts) with your normal soap.             6.  Wash thoroughly, paying special attention to the area where your surgery  will be performed.  7.  Thoroughly rinse your body with warm water from the neck down.  8.  DO NOT shower/wash with your normal soap after using and rinsing off  the CHG Soap.                9.  Pat yourself dry with a clean towel.            10.  Wear clean pajamas.            11.  Place clean sheets on your bed the night of your first shower and do not  sleep with pets. Day of Surgery : Do not apply any lotions/deodorants the morning of surgery.  Please wear clean clothes to the hospital/surgery center.  FAILURE TO FOLLOW THESE INSTRUCTIONS MAY RESULT IN THE CANCELLATION OF YOUR SURGERY PATIENT SIGNATURE_________________________________  NURSE SIGNATURE__________________________________  ________________________________________________________________________   Adam Phenix  An incentive spirometer is a tool that can help keep your lungs clear and active. This tool measures how well you are filling your lungs with  each breath. Taking long deep breaths may help reverse or decrease the chance of developing breathing (pulmonary) problems (especially infection) following:  A long period of time when you are unable to move or be active. BEFORE THE PROCEDURE   If the spirometer includes an indicator to show your best effort, your nurse or respiratory therapist will set it to a desired goal.  If possible, sit up straight or lean slightly forward. Try not to slouch.  Hold the incentive spirometer in an upright position. INSTRUCTIONS FOR USE  1. Sit on the edge of your bed if possible, or sit up as far as you can in bed or on a chair. 2. Hold the incentive spirometer in an upright position. 3. Breathe out normally. 4. Place the mouthpiece in your mouth and seal your lips tightly around it. 5. Breathe in slowly and as deeply as possible, raising the piston or the ball toward the top of the column. 6. Hold your breath for 3-5 seconds or for as long as possible. Allow the piston or ball to fall to the bottom of the column. 7. Remove the mouthpiece from your mouth and breathe out normally. 8. Rest for a few seconds and repeat Steps 1 through 7 at least 10 times every 1-2 hours when you are awake. Take your time and take a few normal breaths between deep breaths. 9. The spirometer may include an indicator to show your best effort. Use the indicator as a goal to work toward during each repetition. 10. After each set of 10 deep breaths, practice coughing to be sure your lungs are clear. If you have an incision (the cut made at the time of surgery), support your incision when  coughing by placing a pillow or rolled up towels firmly against it. Once you are able to get out of bed, walk around indoors and cough well. You may stop using the incentive spirometer when instructed by your caregiver.  RISKS AND COMPLICATIONS  Take your time so you do not get dizzy or light-headed.  If you are in pain, you may need to take or  ask for pain medication before doing incentive spirometry. It is harder to take a deep breath if you are having pain. AFTER USE  Rest and breathe slowly and easily.  It can be helpful to keep track of a log of your progress. Your caregiver can provide you with a simple table to help with this. If you are using the spirometer at home, follow these instructions: [place] IF:   You are having difficultly using the spirometer.  You have trouble using the spirometer as often as instructed.  Your pain medication is not giving enough relief while using the spirometer.  You develop fever of 100.5 F (38.1 C) or higher. SEEK IMMEDIATE MEDICAL CARE IF:   You cough up bloody sputum that had not been present before.  You develop fever of 102 F (38.9 C) or greater.  You develop worsening pain at or near the incision site. MAKE SURE YOU:   Understand these instructions.  Will watch your condition.  Will get help right away if you are not doing well or get worse. Document Released: 10/15/2006 Document Revised: 08/27/2011 Document Reviewed: 12/16/2006 [facility] Patient Information 2014 [place], [place].   ________________________________________________________________________

## 2014-11-17 ENCOUNTER — Encounter (HOSPITAL_COMMUNITY): Payer: Self-pay | Admitting: Anesthesiology

## 2014-11-17 NOTE — Progress Notes (Signed)
BMP results per PAT visit 11/16/2014 in epic sent to Dr Tonita Cong

## 2014-11-17 NOTE — Anesthesia Preprocedure Evaluation (Signed)
Anesthesia Evaluation  Patient identified by MRN, date of birth, ID band Patient awake    Reviewed: Allergy & Precautions, NPO status , Patient's Chart, lab work & pertinent test results  Airway Mallampati: II  TM Distance: >3 FB Neck ROM: Full    Dental no notable dental hx.    Pulmonary shortness of breath, asthma , pneumonia -, resolved, former smoker,  breath sounds clear to auscultation  Pulmonary exam normal       Cardiovascular hypertension, Pt. on medications + angina Normal cardiovascular exam+ dysrhythmias Rhythm:Regular Rate:Normal     Neuro/Psych  Headaches, PSYCHIATRIC DISORDERS Anxiety Depression    GI/Hepatic Neg liver ROS, GERD-  Medicated,  Endo/Other  negative endocrine ROS  Renal/GU negative Renal ROS  negative genitourinary   Musculoskeletal  (+) Arthritis -,   Abdominal (+) + obese,   Peds negative pediatric ROS (+)  Hematology negative hematology ROS (+)   Anesthesia Other Findings   Reproductive/Obstetrics negative OB ROS                             Anesthesia Physical Anesthesia Plan  ASA: II  Anesthesia Plan: General   Post-op Pain Management:    Induction: Intravenous  Airway Management Planned: Oral ETT  Additional Equipment:   Intra-op Plan:   Post-operative Plan: Extubation in OR  Informed Consent: I have reviewed the patients History and Physical, chart, labs and discussed the procedure including the risks, benefits and alternatives for the proposed anesthesia with the patient or authorized representative who has indicated his/her understanding and acceptance.   Dental advisory given  Plan Discussed with: CRNA  Anesthesia Plan Comments:         Anesthesia Quick Evaluation

## 2014-11-18 ENCOUNTER — Inpatient Hospital Stay (HOSPITAL_COMMUNITY): Payer: Managed Care, Other (non HMO) | Admitting: Anesthesiology

## 2014-11-18 ENCOUNTER — Ambulatory Visit (HOSPITAL_COMMUNITY)
Admission: RE | Admit: 2014-11-18 | Discharge: 2014-11-18 | Disposition: A | Discharge status: Home / Self Care | Payer: Managed Care, Other (non HMO) | Source: Ambulatory Visit | Attending: Specialist | Admitting: Specialist

## 2014-11-18 ENCOUNTER — Encounter (HOSPITAL_COMMUNITY)
Admission: RE | Disposition: A | Discharge status: Home / Self Care | Payer: Self-pay | Source: Ambulatory Visit | Attending: Specialist

## 2014-11-18 ENCOUNTER — Encounter (HOSPITAL_COMMUNITY): Payer: Self-pay | Admitting: *Deleted

## 2014-11-18 DIAGNOSIS — Z87891 Personal history of nicotine dependence: Secondary | ICD-10-CM | POA: Insufficient documentation

## 2014-11-18 DIAGNOSIS — M19012 Primary osteoarthritis, left shoulder: Secondary | ICD-10-CM | POA: Insufficient documentation

## 2014-11-18 DIAGNOSIS — M7542 Impingement syndrome of left shoulder: Secondary | ICD-10-CM | POA: Insufficient documentation

## 2014-11-18 DIAGNOSIS — Z886 Allergy status to analgesic agent status: Secondary | ICD-10-CM | POA: Insufficient documentation

## 2014-11-18 DIAGNOSIS — K219 Gastro-esophageal reflux disease without esophagitis: Secondary | ICD-10-CM | POA: Insufficient documentation

## 2014-11-18 DIAGNOSIS — Z9851 Tubal ligation status: Secondary | ICD-10-CM | POA: Insufficient documentation

## 2014-11-18 DIAGNOSIS — F329 Major depressive disorder, single episode, unspecified: Secondary | ICD-10-CM | POA: Insufficient documentation

## 2014-11-18 DIAGNOSIS — S43402A Unspecified sprain of left shoulder joint, initial encounter: Secondary | ICD-10-CM | POA: Insufficient documentation

## 2014-11-18 HISTORY — PX: SHOULDER ARTHROSCOPY WITH SUBACROMIAL DECOMPRESSION: SHX5684

## 2014-11-18 SURGERY — SHOULDER ARTHROSCOPY WITH SUBACROMIAL DECOMPRESSION
Anesthesia: General | Site: Shoulder | Laterality: Left

## 2014-11-18 MED ORDER — GLYCOPYRROLATE 0.2 MG/ML IJ SOLN
INTRAMUSCULAR | Status: AC
  Filled 2014-11-18: qty 3

## 2014-11-18 MED ORDER — PHENYLEPHRINE 40 MCG/ML (10ML) SYRINGE FOR IV PUSH (FOR BLOOD PRESSURE SUPPORT)
PREFILLED_SYRINGE | INTRAVENOUS | Status: AC
  Filled 2014-11-18: qty 10

## 2014-11-18 MED ORDER — SODIUM CHLORIDE 0.9 % IJ SOLN
INTRAMUSCULAR | Status: AC
  Filled 2014-11-18: qty 10

## 2014-11-18 MED ORDER — CEPHALEXIN 500 MG PO CAPS: 500.0000 mg | ORAL_CAPSULE | Freq: Four times a day (QID) | ORAL | Status: DC

## 2014-11-18 MED ORDER — SUFENTANIL CITRATE 50 MCG/ML IV SOLN
INTRAVENOUS | Status: DC | PRN
  Administered 2014-11-18: 10 ug via INTRAVENOUS
  Administered 2014-11-18: 20 ug via INTRAVENOUS
  Administered 2014-11-18: 10 ug via INTRAVENOUS

## 2014-11-18 MED ORDER — PROPOFOL 10 MG/ML IV BOLUS
INTRAVENOUS | Status: AC
  Filled 2014-11-18: qty 20

## 2014-11-18 MED ORDER — HYDROMORPHONE HCL 1 MG/ML IJ SOLN
INTRAMUSCULAR | Status: AC
  Filled 2014-11-18: qty 1

## 2014-11-18 MED ORDER — ACETAMINOPHEN 10 MG/ML IV SOLN
1000.0000 mg | Freq: Once | INTRAVENOUS | Status: AC
  Administered 2014-11-18: 1000 mg via INTRAVENOUS
  Filled 2014-11-18: qty 100

## 2014-11-18 MED ORDER — HYDROMORPHONE HCL 1 MG/ML IJ SOLN
0.2500 mg | INTRAMUSCULAR | Status: DC | PRN
  Administered 2014-11-18 (×2): 0.5 mg via INTRAVENOUS

## 2014-11-18 MED ORDER — GLYCOPYRROLATE 0.2 MG/ML IJ SOLN
INTRAMUSCULAR | Status: DC | PRN
Start: 2014-11-18 — End: 2014-11-18
  Administered 2014-11-18: .6 mg via INTRAVENOUS

## 2014-11-18 MED ORDER — HYDROMORPHONE HCL 1 MG/ML IJ SOLN
INTRAMUSCULAR | Status: DC | PRN
  Administered 2014-11-18 (×5): .4 mg via INTRAVENOUS

## 2014-11-18 MED ORDER — LACTATED RINGERS IR SOLN
Status: DC | PRN
  Administered 2014-11-18: 3000 mL

## 2014-11-18 MED ORDER — ONDANSETRON HCL 4 MG/2ML IJ SOLN
INTRAMUSCULAR | Status: AC
  Filled 2014-11-18: qty 2

## 2014-11-18 MED ORDER — CEFAZOLIN SODIUM-DEXTROSE 2-3 GM-% IV SOLR
INTRAVENOUS | Status: AC
  Filled 2014-11-18: qty 50

## 2014-11-18 MED ORDER — OXYCODONE HCL 5 MG PO TABS
5.0000 mg | ORAL_TABLET | ORAL | Status: DC | PRN
  Administered 2014-11-18: 5 mg via ORAL
  Filled 2014-11-18: qty 1

## 2014-11-18 MED ORDER — METHOCARBAMOL 500 MG PO TABS: 500.0000 mg | ORAL_TABLET | Freq: Three times a day (TID) | ORAL | Status: DC | PRN

## 2014-11-18 MED ORDER — OXYCODONE-ACETAMINOPHEN 5-325 MG PO TABS: 1.0000 | ORAL_TABLET | ORAL | Status: DC | PRN

## 2014-11-18 MED ORDER — DEXAMETHASONE SODIUM PHOSPHATE 10 MG/ML IJ SOLN
INTRAMUSCULAR | Status: DC | PRN
  Administered 2014-11-18: 10 mg via INTRAVENOUS

## 2014-11-18 MED ORDER — EPHEDRINE SULFATE 50 MG/ML IJ SOLN
INTRAMUSCULAR | Status: AC
  Filled 2014-11-18: qty 1

## 2014-11-18 MED ORDER — DOCUSATE SODIUM 100 MG PO CAPS: 100.0000 mg | ORAL_CAPSULE | Freq: Two times a day (BID) | ORAL | Status: DC | PRN

## 2014-11-18 MED ORDER — SUCCINYLCHOLINE CHLORIDE 20 MG/ML IJ SOLN
INTRAMUSCULAR | Status: DC | PRN
  Administered 2014-11-18: 100 mg via INTRAVENOUS

## 2014-11-18 MED ORDER — HYDROMORPHONE HCL 2 MG/ML IJ SOLN
INTRAMUSCULAR | Status: AC
  Filled 2014-11-18: qty 1

## 2014-11-18 MED ORDER — PROPOFOL 10 MG/ML IV BOLUS
INTRAVENOUS | Status: DC | PRN
  Administered 2014-11-18: 200 mg via INTRAVENOUS

## 2014-11-18 MED ORDER — LIDOCAINE HCL (CARDIAC) 20 MG/ML IV SOLN
INTRAVENOUS | Status: DC | PRN
  Administered 2014-11-18: 100 mg via INTRAVENOUS

## 2014-11-18 MED ORDER — SODIUM CHLORIDE 0.9 % IR SOLN
Status: DC | PRN
  Administered 2014-11-18: 500 mL

## 2014-11-18 MED ORDER — DEXAMETHASONE SODIUM PHOSPHATE 10 MG/ML IJ SOLN
INTRAMUSCULAR | Status: AC
  Filled 2014-11-18: qty 1

## 2014-11-18 MED ORDER — BUPIVACAINE-EPINEPHRINE (PF) 0.5% -1:200000 IJ SOLN
INTRAMUSCULAR | Status: AC
  Filled 2014-11-18: qty 30

## 2014-11-18 MED ORDER — METHOCARBAMOL 1000 MG/10ML IJ SOLN
500.0000 mg | Freq: Once | INTRAVENOUS | Status: AC
  Administered 2014-11-18: 500 mg via INTRAVENOUS
  Filled 2014-11-18: qty 5

## 2014-11-18 MED ORDER — NEOSTIGMINE METHYLSULFATE 10 MG/10ML IV SOLN
INTRAVENOUS | Status: DC | PRN
  Administered 2014-11-18: 4 mg via INTRAVENOUS

## 2014-11-18 MED ORDER — SODIUM CHLORIDE 0.9 % IR SOLN
Status: AC
  Filled 2014-11-18: qty 1

## 2014-11-18 MED ORDER — CISATRACURIUM BESYLATE 20 MG/10ML IV SOLN
INTRAVENOUS | Status: AC
  Filled 2014-11-18: qty 10

## 2014-11-18 MED ORDER — NEOSTIGMINE METHYLSULFATE 10 MG/10ML IV SOLN
INTRAVENOUS | Status: AC
  Filled 2014-11-18: qty 1

## 2014-11-18 MED ORDER — LACTATED RINGERS IV SOLN
INTRAVENOUS | Status: DC
  Administered 2014-11-18 (×2): via INTRAVENOUS

## 2014-11-18 MED ORDER — PHENYLEPHRINE HCL 10 MG/ML IJ SOLN
INTRAMUSCULAR | Status: DC | PRN
  Administered 2014-11-18 (×3): 80 ug via INTRAVENOUS
  Administered 2014-11-18: 40 ug via INTRAVENOUS

## 2014-11-18 MED ORDER — MIDAZOLAM HCL 2 MG/2ML IJ SOLN
INTRAMUSCULAR | Status: AC
  Filled 2014-11-18: qty 2

## 2014-11-18 MED ORDER — EPINEPHRINE HCL 1 MG/ML IJ SOLN
INTRAMUSCULAR | Status: AC
  Filled 2014-11-18: qty 4

## 2014-11-18 MED ORDER — EPINEPHRINE HCL 1 MG/ML IJ SOLN
INTRAMUSCULAR | Status: DC | PRN
  Administered 2014-11-18: 1 mg

## 2014-11-18 MED ORDER — CEFAZOLIN SODIUM-DEXTROSE 2-3 GM-% IV SOLR
2.0000 g | INTRAVENOUS | Status: AC
  Administered 2014-11-18: 2 g via INTRAVENOUS

## 2014-11-18 MED ORDER — LIDOCAINE HCL (CARDIAC) 20 MG/ML IV SOLN
INTRAVENOUS | Status: AC
  Filled 2014-11-18: qty 5

## 2014-11-18 MED ORDER — SUFENTANIL CITRATE 50 MCG/ML IV SOLN
INTRAVENOUS | Status: AC
  Filled 2014-11-18: qty 1

## 2014-11-18 MED ORDER — BUPIVACAINE-EPINEPHRINE 0.5% -1:200000 IJ SOLN
INTRAMUSCULAR | Status: DC | PRN
  Administered 2014-11-18: 5 mL
  Administered 2014-11-18: 14 mL

## 2014-11-18 MED ORDER — EPHEDRINE SULFATE 50 MG/ML IJ SOLN
INTRAMUSCULAR | Status: DC | PRN
  Administered 2014-11-18 (×3): 10 mg via INTRAVENOUS

## 2014-11-18 MED ORDER — CISATRACURIUM BESYLATE (PF) 10 MG/5ML IV SOLN
INTRAVENOUS | Status: DC | PRN
  Administered 2014-11-18: 6 mg via INTRAVENOUS

## 2014-11-18 MED ORDER — MIDAZOLAM HCL 5 MG/5ML IJ SOLN
INTRAMUSCULAR | Status: DC | PRN
  Administered 2014-11-18 (×2): 1 mg via INTRAVENOUS

## 2014-11-18 MED ORDER — ONDANSETRON HCL 4 MG/2ML IJ SOLN
INTRAMUSCULAR | Status: DC | PRN
  Administered 2014-11-18: 4 mg via INTRAVENOUS

## 2014-11-18 SURGICAL SUPPLY — 36 items
BLADE 4.2CUDA (BLADE) ×2 IMPLANT
BLADE SURG SZ11 CARB STEEL (BLADE) ×2 IMPLANT
BOOTIES KNEE HIGH SLOAN (MISCELLANEOUS) ×4 IMPLANT
BUR OVAL 4.0 (BURR) IMPLANT
CANNULA ACUFO 5X76 (CANNULA) ×2 IMPLANT
CLOTH 2% CHLOROHEXIDINE 3PK (PERSONAL CARE ITEMS) ×2 IMPLANT
DRAPE STERI 35X30 U-POUCH (DRAPES) ×2 IMPLANT
DRAPE U-SHAPE 47X51 STRL (DRAPES) ×2 IMPLANT
DRSG AQUACEL AG ADV 3.5X 4 (GAUZE/BANDAGES/DRESSINGS) ×2 IMPLANT
DRSG AQUACEL AG ADV 3.5X 6 (GAUZE/BANDAGES/DRESSINGS) IMPLANT
DRSG PAD ABDOMINAL 8X10 ST (GAUZE/BANDAGES/DRESSINGS) IMPLANT
DURAPREP 26ML APPLICATOR (WOUND CARE) ×2 IMPLANT
GAUZE SPONGE 4X4 12PLY STRL (GAUZE/BANDAGES/DRESSINGS) IMPLANT
GLOVE BIOGEL PI IND STRL 7.5 (GLOVE) ×1 IMPLANT
GLOVE BIOGEL PI IND STRL 8 (GLOVE) IMPLANT
GLOVE BIOGEL PI INDICATOR 7.5 (GLOVE) ×1
GLOVE BIOGEL PI INDICATOR 8 (GLOVE)
GLOVE SURG SS PI 7.5 STRL IVOR (GLOVE) ×2 IMPLANT
GLOVE SURG SS PI 8.0 STRL IVOR (GLOVE) ×2 IMPLANT
GOWN STRL REUS W/TWL XL LVL3 (GOWN DISPOSABLE) ×4 IMPLANT
KIT BASIN OR (CUSTOM PROCEDURE TRAY) ×2 IMPLANT
KIT POSITION SHOULDER SCHLEI (MISCELLANEOUS) ×2 IMPLANT
MANIFOLD NEPTUNE II (INSTRUMENTS) ×2 IMPLANT
NEEDLE SPNL 18GX3.5 QUINCKE PK (NEEDLE) ×2 IMPLANT
PACK SHOULDER (CUSTOM PROCEDURE TRAY) ×2 IMPLANT
POSITIONER SURGICAL ARM (MISCELLANEOUS) ×2 IMPLANT
SET ARTHROSCOPY TUBING (MISCELLANEOUS) ×1
SET ARTHROSCOPY TUBING LN (MISCELLANEOUS) ×1 IMPLANT
SLING ARM IMMOBILIZER LRG (SOFTGOODS) ×2 IMPLANT
SLING ARM LRG ADULT FOAM STRAP (SOFTGOODS) ×2 IMPLANT
SLING ARM MED ADULT FOAM STRAP (SOFTGOODS) IMPLANT
STRIP CLOSURE SKIN 1/2X4 (GAUZE/BANDAGES/DRESSINGS) ×2 IMPLANT
SUT ETHILON 4 0 PS 2 18 (SUTURE) ×2 IMPLANT
TOWEL OR 17X26 10 PK STRL BLUE (TOWEL DISPOSABLE) ×2 IMPLANT
TUBING CONNECTING 10 (TUBING) ×2 IMPLANT
WAND 90 DEG TURBOVAC W/CORD (SURGICAL WAND) IMPLANT

## 2014-11-18 NOTE — Interval H&P Note (Signed)
History and Physical Interval Note:  11/18/2014 7:25 AM  Teresa Dunn [name]  has presented today for surgery, with the diagnosis of IMPINGEMENT SYNDROME ON LEFT  The various methods of treatment have been discussed with the patient and family. After consideration of risks, benefits and other options for treatment, the patient has consented to  Procedure(s): LEFT SHOULDER ARTHROSCOPY WITH SUBACROMIAL DECOMPRESSION/DEBRIDEMENT LABREAL/MINI DISTAL CLAVICLE RESECTION (Left) as a surgical intervention .  The patient's history has been reviewed, patient examined, no change in status, stable for surgery.  I have reviewed the patient's chart and labs.  Questions were answered to the patient's satisfaction.     Shail Urbas C

## 2014-11-18 NOTE — Transfer of Care (Signed)
Immediate Anesthesia Transfer of Care Note  Patient: Teresa Dunn  Procedure(s) Performed: Procedure(s): LEFT SHOULDER ARTHROSCOPY WITH SUBACROMIAL DECOMPRESSION/DEBRIDEMENT LABREAL/MINI DISTAL CLAVICLE RESECTION (Left)  Patient Location: PACU  Anesthesia Type:General  Level of Consciousness: awake, alert  and oriented  Airway & Oxygen Therapy: Patient Spontanous Breathing and Patient connected to nasal cannula oxygen  Post-op Assessment: Report given to RN and Post -op Vital signs reviewed and stable  Post vital signs: Reviewed and stable  Last Vitals:  Filed Vitals:   11/18/14 0528  BP: 128/81  Pulse: 66  Temp: 36.6 C  Resp: 18    Complications: No apparent anesthesia complications

## 2014-11-18 NOTE — Discharge Instructions (Signed)
Aquacel dressing may remain in place until follow up. May shower with aquacel dressing in place. If the dressing becomes saturated or peels off, you may remove it and place a new dressing with gauze and tape which should be kept clean and dry and changed daily. Use sling at times except when exercising or showering No driving while wearing sling No lifting Pendulum and motion exercises as instructed. Ok to move wrist, elbow, and hand. See Dr. Tonita Cong in 10-14 days.  Take one aspirin per day with a meal if not on a blood thinner or allergic to aspirin.         Sling Use After Injury or Surgery You have been put in a sling today because of an injury or following surgery. If you have a tendon or bone injury it may take up to 6 weeks to heal. Use the sling as directed until your caregiver says it is no longer needed. The sling protects and keeps you from using the injured part. Hanging your arm in a sling will give rest and support to the injured part. This also helps with comfort and healing. Slings are used for injuries made worse or more painful by movement. Examples include:  Broken arms.  Broken collarbones.  Shoulder injuries.  Following surgery. The sling should fit comfortably, with your elbow at one end of the sling and your hand at the other end. Your elbow is bent 90 degrees lying across your waist and rests in the sling with your thumb pointing up. Make sure that the hand of the injured arm does not droop down. That could stretch some nerves in the wrist. Your hand should be slightly higher than your elbow. You may also pad the sling behind your neck with some cloth or foam rubber.  A swathe may also be used if it is necessary to keep you from lifting your injured arm. A swathe is a wrap or ace bandage that goes around your chest over your injured arm.  To take the weight off your neck, some slings have a strap that goes around your neck and down your back. One strap is connected to  the closed elbow side of the sling with the other end of the strap attached to the wrist side. With a sling like this, your injured shoulder, arm, wrist, or hand is in the sling, the weight is more on your shoulder and back. This is different from the illustration where the sling is supported only by the neck.  In an emergency, a sling can be as simple as a belt or towel tied around your neck to hold your forearm.  HOME CARE INSTRUCTIONS   Do not use your shoulder until instructed to by your caregiver.  If you have been prescribed physical therapy, keep appointments as directed.  For the first couple days following your injury and during times when you are sore, you may use ice on the injured area for 15-20 minutes 03-04 times per day while awake. Put the ice in a plastic bag and place a towel between the bag of ice and your skin. This will help keep the swelling down.  If there is numbness in the fifth finger and ring fingers you may need to pad the elbow to relieve pressure on the ulnar nerve (the crazy bone).  Keep your arm on your chest when lying down.  If a plaster splint was applied, wear the splint until you are seen for a follow-up examination. Rest it on  nothing harder than a pillow the first 24 hours. Do not get it wet. You may take it off to take a shower or bath unless instructed otherwise by your caregiver.  You may have been given an elastic bandage to use with the plaster splint or alone. The splint is too tight if you have numbness, tingling, or if your hand becomes cold and blue. Adjust or reapply the bandage to make it comfortable.  Only take over-the-counter or prescription medicines for pain, discomfort, or fever as directed by your caregiver.  If range of motion exercises are permitted by your caregiver, do not go over the limits suggested. If you have increased pain from doing gentle exercises, stop the exercises until you see your caregiver again.  The length of time  needed for healing depends on what your injury or surgery was. SEEK IMMEDIATE MEDICAL CARE IF:   You have an increase in bruising, swelling or pain in the area of your injury or surgery.  You notice a blue color of or coldness in your fingers.  Pain relief is not obtained with medications or any of your problems are getting worse. Document Released: 01/17/2004 Document Revised: 05/21/2012 Document Reviewed: 04/19/2007 [facility] Patient Information 2015 [place], [place]. This information is not intended to replace advice given to you by your health care provider. Make sure you discuss any questions you have with your health care provider.       General Anesthesia, Care After Refer to this sheet in the next few weeks. These instructions provide you with information on caring for yourself after your procedure. Your health care provider may also give you more specific instructions. Your treatment has been planned according to current medical practices, but problems sometimes occur. Call your health care provider if you have any problems or questions after your procedure. WHAT TO EXPECT AFTER THE PROCEDURE After the procedure, it is typical to experience:  Sleepiness.  Nausea and vomiting. HOME CARE INSTRUCTIONS  For the first 24 hours after general anesthesia:  Have a responsible person with you.  Do not drive a car. If you are alone, do not take public transportation.  Do not drink alcohol.  Do not take medicine that has not been prescribed by your health care provider.  Do not sign important papers or make important decisions.  You may resume a normal diet and activities as directed by your health care provider.  Change bandages (dressings) as directed.  If you have questions or problems that seem related to general anesthesia, call the hospital and ask for the anesthetist or anesthesiologist on call. SEEK MEDICAL CARE IF:  You have nausea and vomiting that continue the day  after anesthesia.  You develop a rash. SEEK IMMEDIATE MEDICAL CARE IF:   You have difficulty breathing.  You have chest pain.  You have any allergic problems. Document Released: 09/10/2000 Document Revised: 06/09/2013 Document Reviewed: 12/18/2012 [facility] Patient Information 2015 [place], [place]. This information is not intended to replace advice given to you by your health care provider. Make sure you discuss any questions you have with your health care provider.

## 2014-11-18 NOTE — Anesthesia Postprocedure Evaluation (Signed)
  Anesthesia Post-op Note  Patient: Teresa Dunn  Procedure(s) Performed: Procedure(s) (LRB): LEFT SHOULDER ARTHROSCOPY WITH SUBACROMIAL DECOMPRESSION/DEBRIDEMENT LABREAL/MINI DISTAL CLAVICLE RESECTION (Left)  Patient Location: PACU  Anesthesia Type: General  Level of Consciousness: awake and alert   Airway and Oxygen Therapy: Patient Spontanous Breathing  Post-op Pain: mild  Post-op Assessment: Post-op Vital signs reviewed, Patient's Cardiovascular Status Stable, Respiratory Function Stable, Patent Airway and No signs of Nausea or vomiting  Last Vitals:  Filed Vitals:   11/18/14 1155  BP: 129/65  Pulse: 66  Temp: 36.9 C  Resp: 16    Post-op Vital Signs: stable   Complications: No apparent anesthesia complications

## 2014-11-18 NOTE — Anesthesia Procedure Notes (Signed)
Procedure Name: Intubation Date/Time: 11/18/2014 7:40 AM Performed by: Danley Danker L Patient Re-evaluated:Patient Re-evaluated prior to inductionOxygen Delivery Method: Circle system utilized Preoxygenation: Pre-oxygenation with 100% oxygen Intubation Type: IV induction Ventilation: Mask ventilation without difficulty and Oral airway inserted - appropriate to patient size Laryngoscope Size: Sabra Heck and 2 Grade View: Grade I Tube type: Oral Tube size: 7.5 mm Number of attempts: 1 Airway Equipment and Method: Stylet Placement Confirmation: ETT inserted through vocal cords under direct vision,  breath sounds checked- equal and bilateral and positive ETCO2 Secured at: 21 cm Tube secured with: Tape Dental Injury: Teeth and Oropharynx as per pre-operative assessment

## 2014-11-18 NOTE — Brief Op Note (Signed)
11/18/2014  9:07 AM  PATIENT:  Teresa Dunn  49 y.o. female  PRE-OPERATIVE DIAGNOSIS:  IMPINGEMENT SYNDROME ON LEFT  POST-OPERATIVE DIAGNOSIS:  IMPINGEMENT SYNDROME ON LEFT  PROCEDURE:  Procedure(s): LEFT SHOULDER ARTHROSCOPY WITH SUBACROMIAL DECOMPRESSION/DEBRIDEMENT LABREAL/MINI DISTAL CLAVICLE RESECTION (Left)  SURGEON:  Surgeon(s) and Role:    * Susa Day, MD - Primary  PHYSICIAN ASSISTANT:   ASSISTANTS: [place]   ANESTHESIA:   general  EBL:  Total I/O In: 1000 [I.V.:1000] Out: -   BLOOD ADMINISTERED:none  DRAINS: none   LOCAL MEDICATIONS USED:  MARCAINE     SPECIMEN:  No Specimen  DISPOSITION OF SPECIMEN:  N/A  COUNTS:  YES  TOURNIQUET:  * No tourniquets in log *  DICTATION: .Other Dictation: Dictation Number [id number]  PLAN OF CARE: dc to home  PATIENT DISPOSITION:  PACU - hemodynamically stable.   Delay start of Pharmacological VTE agent (>24hrs) due to surgical blood loss or risk of bleeding: no

## 2014-11-18 NOTE — H&P (View-Only) (Signed)
Teresa Dunn is an 49 y.o. female.   Chief Complaint: L shoulder pain HPI: The patient is a 49 year old female who presents today for follow up of their shoulder. The patient is being followed for their left shoulder pain. They are 7 year(s) out from when symptoms began. Symptoms reported today include: pain and popping. and report their pain level to be moderate to severe. Current treatment includes: activity modification and NSAIDs (Ibuprofen). The following medication has been used for pain control: Ultram (prn). The patient presents today following MRI. Note for "Shoulder complaints": Teresa Dunn follows up for her L shoulder to review MRI. She reports ongoing pain left shoulder, no better since her last visit. She experiences the most pain with overhead activity, abduction, and internal rotation. She does report some pain in the [facility] joint as well as subacromial and into the deltoid (worst in the deltoid region). She is taking ultram as needed for pain. She had a steroid injection done by her PCP at the beginning of March with zero relief. Last steroid injection in late March at our office with no significant long term relief.   Past Medical History  Diagnosis Date  . Cancer 1988    cervical  . Depression   . Chronic headaches   . GERD (gastroesophageal reflux disease)   . Genital warts     Past Surgical History  Procedure Laterality Date  . Tubal ligation      Family History  Problem Relation Age of Onset  . Heart disease Mother   . Hypertension Mother   . Mental illness Mother   . Diabetes Mother   . Heart disease Father   . Hypertension Father   . Diabetes Father   . Cancer Maternal Grandmother   . Stroke Maternal Grandmother    Social History:  reports that she quit smoking about 2 years ago. Her smoking use included Cigarettes. She has a 30 pack-year smoking history. She does not have any smokeless tobacco history on file. Her alcohol and drug histories are not on  file.  Allergies:  Allergies  Allergen Reactions  . Codeine Nausea And Vomiting  . Asa [Aspirin] Other (See Comments)    wheezing     (Not in a hospital admission)  No results found for this or any previous visit (from the past 48 hour(s)). No results found.  Review of Systems  Constitutional: Negative.   HENT: Negative.   Eyes: Negative.   Respiratory: Negative.   Cardiovascular: Negative.   Gastrointestinal: Negative.   Genitourinary: Negative.   Musculoskeletal: Positive for joint pain.  Skin: Negative.   Neurological: Negative.     There were no vitals taken for this visit. Physical Exam  Constitutional: She is oriented to person, place, and time. She appears well-developed and well-nourished.  HENT:  Head: Normocephalic and atraumatic.  Eyes: Pupils are equal, round, and reactive to light.  Neck: Normal range of motion. Neck supple.  Cardiovascular: Normal rate and regular rhythm.   Respiratory: Effort normal.  GI: Soft.  Musculoskeletal:  Left Upper Extremity: Left Shoulder: Inspection and Palpation - Tenderness - AC joint tender to palpation(mild) and subacromial space tender to palpation, no tenderness to palpation of the [facility] joint, no tenderness to palpation of the clavicle, no tenderness to palpation of the trapezius. Swelling - none. Tissue tension/texture is - soft. Sensation - intact to light touch. Skin - Color - no ecchymosis, no erythema. Left Shoulder - ROM - Full shoulder ROM in all planes of  motion. Strength and Tone - Abduction - painful. Internal Rotation - 5/5. External Rotation - 4+/5 and painful. Left Shoulder - Instability - sulcus sign negative. Impingement - impingement sign positive and secondary impingement sign positive. Deformities/Malalignments/Discrepancies - no deformities noted. Special Testing - AC crossover adduction test negative, Speed's test negative.   Neurological: She is alert and oriented to person, place, and time. She has normal  reflexes.    Prior xrays reviewed, type 1 acromion, AC arthrosis.  MRI images and report reviewed today by Dr. Tonita Cong with rotator cuff tendinosis without tear. Moderate AC arthrosis  Assessment/Plan L shoulder impingement, AC arthrosis  Pt with ongoing L shoulder pain refractory to injections, medications, activity modifications, relative rest, ongoing impingement symptoms interfering with activity. Discussed other options, have decided to proceed with L shoulder arthroscopy, SAD, labral debridement, mini-open DCR. Dr. Tonita Cong discussed the procedure itself with pt as well as risks, complications and alternatives including but not limited to DVT, PE, infx, bleeding, failure of procedure, need for secondary procedure, anesthesia risk, even death. Discussed post-op protocols and recovery.  Plan L shoulder arthroscopy, SAD, labral debridement, mini-open DCR  Katalena Malveaux M. PA-C for Dr. Tonita Cong 11/14/2014, 11:29 AM

## 2014-11-19 ENCOUNTER — Encounter (HOSPITAL_COMMUNITY): Payer: Self-pay | Admitting: Specialist

## 2014-11-19 NOTE — Op Note (Signed)
NAMECHARDONNAY, HOLZMANN                 ACCOUNT NO.:  [id number]  MEDICAL RECORD NO.:  [id number]  LOCATION:  [facility]                         FACILITY:  [facility]  PHYSICIAN:  Susa Day, M.D.    DATE OF BIRTH:  1966-05-31  DATE OF PROCEDURE:  11/18/2014 DATE OF DISCHARGE:  11/18/2014                              OPERATIVE REPORT   PREOPERATIVE DIAGNOSIS:  Impingement syndrome, labral tear, AC arthrosis, posttraumatic left shoulder.  POSTOPERATIVE DIAGNOSIS:  Impingement syndrome, labral tear, AC arthrosis, posttraumatic left shoulder.  PROCEDURE PERFORMED: 1. Exam under anesthesia. 2. Left shoulder arthroscopy with debridement of superior labrum. 3. Subacromial decompression and bursectomy. 4. Mini-open distal clavicle resection.  ANESTHESIA:  General.  ASSISTANT:  Cleophas Dunker, PA.  HISTORY:  A 49 year old with persistent pain despite injections, activity modification, therapy.  MRI indicating AC arthrosis, labral tear, but no rotator cuff tear.  She was indicated for shoulder arthroscopy, debridement of the labrum, decompression of subacromial area.  She had partial relief from temporary injections in the subacromial and the [facility] joint.  Discussed bursectomy, acromioplasty, and mini-distal clavicle resection.  She had an elevated BMI with an ample subcutaneous adipose tissue.  Risks and benefits discussed including bleeding, infection, suboptimal range of motion, DVT, PE, anesthetic complications, persistent symptoms, etc.  TECHNIQUE:  The patient in supine beach-chair position after induction of adequate anesthesia, 2 g Kefzol, the shoulder was examined under anesthesia.  She had full forward flexion, abduction, internal and external rotation.  Surgical marker was then utilized delineate the acromion, AC joint, coracoid.  Again, it was prepped and draped in usual sterile fashion.  Arthroscopic portion was performed first.  A standard posterolateral portal was utilized  through the skin only, with the arm in the 70-30 position.  We then advanced the arthroscopic camera in the glenohumeral joint penetrating atraumatically.  Irrigant was utilized in the affected joint, this was 65 mmHg.  Under direct visualization, anterior portal was fashioned with #11 blade after localization with 18- gauge needle.  Just beneath the biceps tendon, we advanced the cannula. Inspection of the glenohumeral joint revealed normal glenoid, humeral head, biceps tendon insertion.  She had a Buford-type complex anteriorly near its insertion with a small flap, approximately at the 11 o'clock position.  This was shaved and debrided to a stable base with a 3.5 shaver.  Gweneth Dimitri was otherwise unremarkable.  No rotator cuff tear. Therefore, redirected the arthroscopic camera in the subacromial space triangulating with a lateral portal fashion with #11 blade anterolaterally.  Exuberant hypertrophic bursa was noted in the subacromial space.  I introduced a 3.5 shaver and performed a thorough bursectomy.  The rotator cuff was without tear.  There was a hypertrophic CA ligament anteriorly with anterior impingement. Introduced an ArthroWand and morselized the CA ligament attachment. This freed up the subacromial space which was somewhat tight.  Shaver performed a small abrasion arthroplasty of the anterolateral aspect of the acromion.  Copiously lavaged the joint.  Again, inspection revealed no rotator cuff tear.  Full bursectomy performed.  Next, we removed the arthroscopic equipment.  We did localize the [facility] joint medially.  With an 18-gauge needle, we removed the arthroscopic camera, closed the  portals with 4-0 nylon simple sutures, then converted to a mini-open.  We made a small incision in Langer's lines over the [facility] joint from anterior to posterior.  Subcutaneous tissue was dissected.  Electrocautery was utilized to achieve hemostasis.  We divided deltotrapezial fascia and the capsule  over the joint, skeletonized the distal clavicle with an AO elevator, protected it anteriorly and posteriorly and the rotator cuff with Baby Homans.  Severe arthrosis of the [facility] joint was noted with hypertrophic spurring.  We used an oscillating saw to remove the distal 1.5 cm of the clavicle protecting the rotator cuff.  We then removed osteophytic spurs projecting inferiorly with a 3-mm Kerrison protecting again the rotator cuff tendon.  The clavicle was stable.  We did not violate the acromioclavicular joint or the coracoclavicular ligaments for this.  Bone wax after irrigation was placed on the distal clavicle, then inspected, no rotator cuff tear.  I closed the capsule with 0 Vicryl, deltotrapezial fascia with 2-0, the subcu with 2-0, and skin with subcuticular Prolene.  Sterile dressing applied.  Placed in a sling, extubated without difficulty, and transported to the recovery room in satisfactory condition.  A 0.25% Marcaine with epinephrine was infiltrated into subacromial space.  Transported to the recovery in satisfactory condition.  The patient tolerated the procedure well.  Minimal blood loss.  Assistant, Cleophas Dunker, PA, was used throughout the case to provide traction to the upper extremity, to gain access to the glenohumeral joint, monitoring the inflow and the outflow of the arthroscopic equipment, closure, and positioning.     Susa Day, M.D.     Geralynn Rile  D:  11/18/2014  T:  11/18/2014  Job:  [id number]

## 2016-05-25 ENCOUNTER — Emergency Department (HOSPITAL_COMMUNITY): Payer: Managed Care, Other (non HMO)

## 2016-05-25 ENCOUNTER — Encounter (HOSPITAL_COMMUNITY): Payer: Self-pay | Admitting: Emergency Medicine

## 2016-05-25 ENCOUNTER — Emergency Department (HOSPITAL_COMMUNITY)
Admission: EM | Admit: 2016-05-25 | Discharge: 2016-05-25 | Disposition: A | Discharge status: Home / Self Care | Payer: Managed Care, Other (non HMO) | Attending: Emergency Medicine | Admitting: Emergency Medicine

## 2016-05-25 DIAGNOSIS — Z79899 Other long term (current) drug therapy: Secondary | ICD-10-CM | POA: Diagnosis not present

## 2016-05-25 DIAGNOSIS — R42 Dizziness and giddiness: Secondary | ICD-10-CM

## 2016-05-25 DIAGNOSIS — R079 Chest pain, unspecified: Secondary | ICD-10-CM | POA: Diagnosis not present

## 2016-05-25 DIAGNOSIS — J45909 Unspecified asthma, uncomplicated: Secondary | ICD-10-CM | POA: Insufficient documentation

## 2016-05-25 DIAGNOSIS — R911 Solitary pulmonary nodule: Secondary | ICD-10-CM | POA: Diagnosis not present

## 2016-05-25 DIAGNOSIS — Z8541 Personal history of malignant neoplasm of cervix uteri: Secondary | ICD-10-CM | POA: Diagnosis not present

## 2016-05-25 DIAGNOSIS — I10 Essential (primary) hypertension: Secondary | ICD-10-CM | POA: Insufficient documentation

## 2016-05-25 DIAGNOSIS — Z87891 Personal history of nicotine dependence: Secondary | ICD-10-CM | POA: Diagnosis not present

## 2016-05-25 LAB — URINALYSIS, ROUTINE W REFLEX MICROSCOPIC
BILIRUBIN URINE: NEGATIVE
GLUCOSE, UA: NEGATIVE mg/dL
Hgb urine dipstick: NEGATIVE
KETONES UR: NEGATIVE mg/dL
NITRITE: NEGATIVE
PH: 6 (ref 5.0–8.0)
Protein, ur: NEGATIVE mg/dL
Specific Gravity, Urine: 1.006 (ref 1.005–1.030)

## 2016-05-25 LAB — BASIC METABOLIC PANEL
Anion gap: 9 (ref 5–15)
BUN: 13 mg/dL (ref 6–20)
CHLORIDE: 104 mmol/L (ref 101–111)
CO2: 26 mmol/L (ref 22–32)
Calcium: 9.7 mg/dL (ref 8.9–10.3)
Creatinine, Ser: 0.67 mg/dL (ref 0.44–1.00)
GFR calc non Af Amer: >60 mL/min (ref >60)
GLUCOSE: 118 mg/dL — AB (ref 65–99)
Potassium: 4.2 mmol/L (ref 3.5–5.1)
Sodium: 139 mmol/L (ref 135–145)

## 2016-05-25 LAB — CBC WITH DIFFERENTIAL/PLATELET
BASOS PCT: 0 %
Basophils Absolute: 0 10*3/uL (ref 0.0–0.1)
Eosinophils Absolute: 0.1 10*3/uL (ref 0.0–0.7)
Eosinophils Relative: 1 %
HCT: 38.3 % (ref 36.0–46.0)
HEMOGLOBIN: 12.8 g/dL (ref 12.0–15.0)
LYMPHS ABS: 1.4 10*3/uL (ref 0.7–4.0)
LYMPHS PCT: 28 %
MCH: 29.8 pg (ref 26.0–34.0)
MCHC: 33.4 g/dL (ref 30.0–36.0)
MCV: 89.3 fL (ref 78.0–100.0)
Monocytes Absolute: 0.3 10*3/uL (ref 0.1–1.0)
Monocytes Relative: 6 %
NEUTROS ABS: 3.2 10*3/uL (ref 1.7–7.7)
NEUTROS PCT: 65 %
Platelets: 341 10*3/uL (ref 150–400)
RBC: 4.29 MIL/uL (ref 3.87–5.11)
RDW: 13 % (ref 11.5–15.5)
WBC: 4.9 10*3/uL (ref 4.0–10.5)

## 2016-05-25 LAB — I-STAT TROPONIN, ED: TROPONIN I, POC: 0 ng/mL (ref 0.00–0.08)

## 2016-05-25 LAB — D-DIMER, QUANTITATIVE: D-Dimer, Quant: 0.67 ug/mL-FEU — ABNORMAL HIGH (ref 0.00–0.50)

## 2016-05-25 LAB — I-STAT BETA HCG BLOOD, ED (MC, WL, AP ONLY)

## 2016-05-25 MED ORDER — IOPAMIDOL (ISOVUE-370) INJECTION 76%
INTRAVENOUS | Status: AC
  Administered 2016-05-25: 100 mL
  Filled 2016-05-25: qty 100

## 2016-05-25 MED ORDER — SODIUM CHLORIDE 0.9 % IV BOLUS (SEPSIS)
1000.0000 mL | Freq: Once | INTRAVENOUS | Status: AC
  Administered 2016-05-25: 1000 mL via INTRAVENOUS

## 2016-05-25 NOTE — ED Notes (Signed)
Called X-ray to have them come get patient. They stated that she is next in line

## 2016-05-25 NOTE — ED Triage Notes (Signed)
Dizzy bp up having back pain and cp and nausea x 4 days

## 2016-05-25 NOTE — ED Provider Notes (Signed)
[facility] DEPT Provider Note   CSN: [id number] Arrival date & time: 05/25/16  1014     History   Chief Complaint Chief Complaint  Patient presents with  . Chest Pain  . Dizziness    HPI Teresa Dunn is a 50 y.o. female.  Patient with past medical history of asthma and anxiety presents to the emergency department with chief complaint of intermittent chest pain times past 4 days. She also reports associated shortness breath, and lightheadedness. She states the symptoms have been intermittent. She states that the symptoms are worse when she stands up.  She denies any history of heart problems, PE, or DVT. She denies any fevers chills. He denies any recent travel, recent surgery, or immobilization.   The history is provided by the patient. No language interpreter was used.    Past Medical History:  Diagnosis Date  . Anginal pain ([place])    occas CPs pt relates to anxiety   . Anxiety   . Arthritis   . Asthma   . Bronchitis    hx of   . Cancer ([place]) 1988   cervical  . Chronic headaches   . Depression   . Dysrhythmia    occas flutters pt relates to anxiety   . Genital warts   . GERD (gastroesophageal reflux disease)   . Hypertension   . Pneumonia    hx of   . Shortness of breath dyspnea    getting too hot; taking aspirin;perfumes    Patient Active Problem List   Diagnosis Date Noted  . Impingement syndrome of left shoulder 11/18/2014  . Perennial allergic rhinitis 08/07/2012  . 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 Unspecified essential hypertension 08/07/2012  . GERD (gastroesophageal reflux disease) 06/26/2012  . History of depression 06/26/2012    Past Surgical History:  Procedure Laterality Date  . SHOULDER ARTHROSCOPY WITH SUBACROMIAL DECOMPRESSION Left 11/18/2014   Procedure: LEFT SHOULDER ARTHROSCOPY WITH SUBACROMIAL DECOMPRESSION/DEBRIDEMENT LABREAL/MINI DISTAL CLAVICLE RESECTION;  Surgeon: Susa Day, MD;  Location: [facility];  Service:  Orthopedics;  Laterality: Left;  . TUBAL LIGATION      OB History    No data available       Home Medications    Prior to Admission medications   Medication Sig Start Date End Date Taking? Authorizing Provider  albuterol (PROVENTIL HFA;VENTOLIN HFA) 108 (90 BASE) MCG/ACT inhaler Inhale 1-2 puffs into the lungs every 4 (four) hours as needed for wheezing or shortness of breath.    Historical Provider, MD  budesonide-formoterol (SYMBICORT) 160-4.5 MCG/ACT inhaler Inhale 2 puffs into the lungs every morning.    Historical Provider, MD  cephALEXin (KEFLEX) 500 MG capsule Take 1 capsule (500 mg total) by mouth 4 (four) times daily. 11/18/14   Cecilie Kicks, PA-C  dexlansoprazole (DEXILANT) 60 MG capsule Take 60 mg by mouth every morning.    Historical Provider, MD  docusate sodium (COLACE) 100 MG capsule Take 1 capsule (100 mg total) by mouth 2 (two) times daily as needed for mild constipation. 11/18/14   Susa Day, MD  lisinopril (PRINIVIL,ZESTRIL) 30 MG tablet Take 30 mg by mouth at bedtime.    Historical Provider, MD  loratadine (CLARITIN) 10 MG tablet Take 10 mg by mouth at bedtime.    Historical Provider, MD  methocarbamol (ROBAXIN) 500 MG tablet Take 1 tablet (500 mg total) by mouth every 8 (eight) hours as needed for muscle spasms. 11/18/14   Susa Day, MD  montelukast (SINGULAIR) 10 MG tablet Take 1  tablet (10 mg total) by mouth at bedtime. 08/07/12   Eulas Post, MD  oxyCODONE-acetaminophen (PERCOCET) 5-325 MG per tablet Take 1 tablet by mouth every 4 (four) hours as needed. 11/18/14   Susa Day, MD  Triamcinolone Acetonide (NASACORT ALLERGY 24HR NA) Place 1 spray into the nose daily as needed (congestion/allergies.).    Historical Provider, MD    Family History Family History  Problem Relation Age of Onset  . Heart disease Mother   . Hypertension Mother   . Mental illness Mother   . Diabetes Mother   . Heart disease Father   . Hypertension Father   . Diabetes  Father   . Cancer Maternal Grandmother   . Stroke Maternal Grandmother     Social History Social History  Substance Use Topics  . Smoking status: Former Smoker    Packs/day: 1.00    Years: 30.00    Types: Cigarettes    Quit date: 04/16/2014  . Smokeless tobacco: Never Used  . Alcohol use Yes     Comment: socially      Allergies   Codeine and Asa [aspirin]   Review of Systems Review of Systems  Respiratory: Positive for shortness of breath.   Cardiovascular: Positive for chest pain.  Neurological: Positive for light-headedness.  All other systems reviewed and are negative.    Physical Exam Updated Vital Signs BP 177/76 (BP Location: Left Arm)   Pulse (!) 59   Temp 98.6 F (37 C) (Oral)   Ht 5\' 7"  (1.702 m)   Wt 99.8 kg   SpO2 100%   BMI 34.46 kg/m   Physical Exam  Constitutional: She is oriented to person, place, and time. She appears well-developed and well-nourished.  HENT:  Head: Normocephalic and atraumatic.  Eyes: Conjunctivae and EOM are normal. Pupils are equal, round, and reactive to light.  Neck: Normal range of motion. Neck supple.  Cardiovascular: Normal rate and regular rhythm.  Exam reveals no gallop and no friction rub.   No murmur heard. Pulmonary/Chest: Effort normal and breath sounds normal. No respiratory distress. She has no wheezes. She has no rales. She exhibits no tenderness.  CTAB  Abdominal: Soft. Bowel sounds are normal. She exhibits no distension and no mass. There is no tenderness. There is no rebound and no guarding.  Musculoskeletal: Normal range of motion. She exhibits no edema or tenderness.  Neurological: She is alert and oriented to person, place, and time.  Skin: Skin is warm and dry.  Psychiatric: She has a normal mood and affect. Her behavior is normal. Judgment and thought content normal.  Nursing note and vitals reviewed.    ED Treatments / Results  Labs (all labs ordered are listed, but only abnormal results are  displayed) Labs Reviewed  BASIC METABOLIC PANEL - Abnormal; Notable for the following:       Result Value   Glucose, Bld 118 (*)    All other components within normal limits  D-DIMER, QUANTITATIVE (NOT AT [facility]) - Abnormal; Notable for the following:    D-Dimer, Quant 0.67 (*)    All other components within normal limits  URINALYSIS, ROUTINE W REFLEX MICROSCOPIC - Abnormal; Notable for the following:    Color, Urine STRAW (*)    Leukocytes, UA SMALL (*)    Bacteria, UA RARE (*)    Squamous Epithelial / LPF 0-5 (*)    All other components within normal limits  CBC WITH DIFFERENTIAL/PLATELET  I-STAT TROPOININ, ED  I-STAT BETA HCG BLOOD, ED ([facility],  [facility], [facility] ONLY)    EKG  EKG Interpretation None       Radiology No results found.  Procedures Procedures (including critical care time)  Medications Ordered in ED Medications  sodium chloride 0.9 % bolus 1,000 mL (not administered)     Initial Impression / Assessment and Plan / ED Course  I have reviewed the triage vital signs and the nursing notes.  Pertinent labs & imaging results that were available during my care of the patient were reviewed by me and considered in my medical decision making (see chart for details).  Clinical Course     Patient with chest pain, shortness breath, and dizziness tends past 4 days. Symptoms of intermittent. Labs, chest x-ray, orthostatic vital signs, and reassess.  D-dimer was elevated, CT scan of the chest was negative for pulmonary embolus, there was an incidental pulmonary nodule found. I discussed this finding with the patient, and have encouraged patient follow-up with her primary care doctor. She understands and agrees this plan.  Troponin is negative. Symptoms have been ongoing for the past 4 days, doubt ACS.  Will recommend primary care follow-up. Patient's symptoms may be menopause related.  Final Clinical Impressions(s) / ED Diagnoses   Final diagnoses:  Lightheadedness  Chest  pain, unspecified type  Pulmonary nodule    New Prescriptions New Prescriptions   No medications on file     Montine Circle, PA-C 05/25/16 [place], MD 05/31/16 1610

## 2016-05-25 NOTE — Discharge Instructions (Signed)
There was no emergent cause of your symptoms found today.  It is recommended that you follow-up with your primary care doctor.  It is possible that your symptoms could be related to starting menopause, please discuss this with your doctor.  Additionally, on the CT scan today there was a small pulmonary nodule that was identified.  This will need to be reassessed in the future.  Your doctor can help you schedule this test.

## 2016-11-30 DIAGNOSIS — S83242A Other tear of medial meniscus, current injury, left knee, initial encounter: Secondary | ICD-10-CM

## 2016-11-30 DIAGNOSIS — M722 Plantar fascial fibromatosis: Secondary | ICD-10-CM | POA: Insufficient documentation

## 2016-11-30 HISTORY — DX: Other tear of medial meniscus, current injury, left knee, initial encounter: S83.242A

## 2017-04-18 DIAGNOSIS — M94262 Chondromalacia, left knee: Secondary | ICD-10-CM

## 2017-04-18 HISTORY — DX: Chondromalacia, left knee: M94.262

## 2017-10-28 ENCOUNTER — Ambulatory Visit: Payer: 59 | Admitting: Family Medicine

## 2017-11-05 ENCOUNTER — Encounter: Payer: Self-pay | Admitting: *Deleted

## 2018-02-25 ENCOUNTER — Encounter: Payer: Self-pay | Admitting: Family Medicine

## 2018-02-25 ENCOUNTER — Other Ambulatory Visit: Payer: Self-pay

## 2018-02-25 ENCOUNTER — Ambulatory Visit: Payer: BC Managed Care – PPO | Admitting: Family Medicine

## 2018-02-25 VITALS — BP 132/82 | HR 69 | Temp 97.9°F | Ht 67.0 in | Wt 236.2 lb

## 2018-02-25 DIAGNOSIS — D171 Benign lipomatous neoplasm of skin and subcutaneous tissue of trunk: Secondary | ICD-10-CM | POA: Insufficient documentation

## 2018-02-25 DIAGNOSIS — Z1212 Encounter for screening for malignant neoplasm of rectum: Secondary | ICD-10-CM

## 2018-02-25 DIAGNOSIS — J4540 Moderate persistent asthma, uncomplicated: Secondary | ICD-10-CM | POA: Diagnosis not present

## 2018-02-25 DIAGNOSIS — I10 Essential (primary) hypertension: Secondary | ICD-10-CM | POA: Insufficient documentation

## 2018-02-25 DIAGNOSIS — F411 Generalized anxiety disorder: Secondary | ICD-10-CM

## 2018-02-25 DIAGNOSIS — Z1211 Encounter for screening for malignant neoplasm of colon: Secondary | ICD-10-CM | POA: Diagnosis not present

## 2018-02-25 DIAGNOSIS — Z1231 Encounter for screening mammogram for malignant neoplasm of breast: Secondary | ICD-10-CM | POA: Diagnosis not present

## 2018-02-25 DIAGNOSIS — Z23 Encounter for immunization: Secondary | ICD-10-CM | POA: Diagnosis not present

## 2018-02-25 DIAGNOSIS — Z87891 Personal history of nicotine dependence: Secondary | ICD-10-CM | POA: Insufficient documentation

## 2018-02-25 DIAGNOSIS — Z1239 Encounter for other screening for malignant neoplasm of breast: Secondary | ICD-10-CM

## 2018-02-25 HISTORY — DX: Personal history of nicotine dependence: Z87.891

## 2018-02-25 MED ORDER — OMEPRAZOLE 40 MG PO CPDR: 40.0000 mg | DELAYED_RELEASE_CAPSULE | Freq: Two times a day (BID) | ORAL | 3 refills | Status: DC

## 2018-02-25 MED ORDER — DULOXETINE HCL 30 MG PO CPEP: 30.0000 mg | ORAL_CAPSULE | Freq: Every day | ORAL | 3 refills | Status: DC

## 2018-02-25 NOTE — Progress Notes (Signed)
Subjective  CC:  Chief Complaint  Patient presents with  . Establish Care    Transfer from Dr. Ernie Hew, last Physical July 2018, requests flu vaccine   . Abdominal Pain    patient states that she has some knots in her feeling in her stomach     HPI: Teresa Dunn is a 52 y.o. female who presents to [facility] today to establish care with me as a new patient.   She has the following concerns or needs:  52 year old new patient to me, former patient of Dr. Ernie Hew, here to establish care.  She is behind on annual physicals and cancer screenings.  She is now ready to have things done as recommended.  However, here today because she has some tenderness on her mid abdomen.  She feels several knots that at times are tender.  No abdominal pain internally, change in bowel habits, melena, nausea, vomiting.  Reviewed her past medical history in detail.  Well-controlled hypertension, well-controlled asthma on Symbicort.  Chronic anxiety disorder taking Cymbalta for the last 10 years.  Was on high-dose, up to 120 mg daily.  Now weaned down to 60 mg daily.  Over the last 10 years she has gained a significant amount of late.  She does admit to being hot all of the time.  Still having menstrual cycles although they are becoming slightly irregular.  Her mood is well controlled.  She has been seeing orthopedics and sports medicine for chronic knee pain.  I reviewed those notes  GERD on chronic PPI  Health maintenance: Due for Pap smear, mammogram, colorectal cancer screening, lab work, influenza vaccination.  We discussed obesity and she would like to work on losing weight in the near future.  She reports her diet is good, eats most of her meals at home.  Exercise is limited  Assessment  1. Essential hypertension   2. Moderate persistent asthma without complication   3. Breast cancer screening   4. Screening for colorectal cancer   5. Lipoma of abdominal wall   6.  Generalized anxiety disorder   7. Need for immunization against influenza      Plan   Hypertension follow-up: This medical condition is well controlled. There are no signs of complications, medication side effects, or red flags. Patient is instructed to continue the current treatment plan without change in therapies or medications.  Asthma: This medical condition is well controlled. There are no signs of complications, medication side effects, or red flags. Patient is instructed to continue the current treatment plan without change in therapies or medications.  Health maintenance: Patient will return for physical exam with fasting lab work.  Order mammogram and colonoscopy referral today.  Educated on screening test.  Updated flu shot today discussed working on weight loss  General anxiety disorder is well controlled on Cymbalta, however weight gain and heat intolerance may be side effects.  Will wean down to 30 mg daily, recheck in 3 to 4 weeks at her physical, and work on changing to a different antianxiety medicine.  Patient understands and agrees with care plan  Follow up:  Return in about 4 weeks (around 03/25/2018) for well child check up. Orders Placed This Encounter  Procedures  . MM DIGITAL SCREENING BILATERAL  . Flu Vaccine QUAD 36+ mos IM  . Ambulatory referral to Gastroenterology   Meds ordered this encounter  Medications  . DULoxetine (CYMBALTA) 30 MG capsule    Sig: Take 1 capsule (30  mg total) by mouth daily.    Dispense:  30 capsule    Refill:  3  . omeprazole (PRILOSEC) 40 MG capsule    Sig: Take 1 capsule (40 mg total) by mouth 2 (two) times daily.    Dispense:  180 capsule    Refill:  3     No flowsheet data found.  We updated and reviewed the patient's past history in detail and it is documented below.  Patient Active Problem List   Diagnosis Date Noted  . Essential hypertension 02/25/2018  . Moderate persistent asthma 02/25/2018  . Lipoma of abdominal wall  02/25/2018  . Former heavy tobacco smoker 02/25/2018    1.5 ppd x 30 years. Quit 2016   . 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 Plantar fasciitis of left foot 11/30/2016  . Tear of medial meniscus of left knee 11/30/2016  . Perennial allergic rhinitis 08/07/2012  . 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 GERD (gastroesophageal reflux disease) 06/26/2012  . Major depressive disorder, recurrent episode ([place]) 10/23/2010    Overview:  Chronic Major Depression  10/1 IMO update   . Generalized anxiety disorder 10/10/2010    Overview:  Generalized Anxiety Disorder    Health Maintenance  Topic Date Due  . HIV Screening  04/05/1982  . PAP SMEAR  06/27/2011  . MAMMOGRAM  04/05/2017  . COLONOSCOPY  04/05/2017  . TETANUS/TDAP  06/26/2021  . INFLUENZA VACCINE  Completed   Immunization History  Administered Date(s) Administered  . Influenza Split 03/26/2012  . Influenza,inj,Quad PF,6+ Mos 02/25/2018  . Influenza,trivalent, recombinat, inj, PF 03/29/2011  . Pneumococcal Conjugate-13 06/27/2007  . Tdap 06/27/2011   Current Meds  Medication Sig  . albuterol (PROVENTIL HFA;VENTOLIN HFA) 108 (90 BASE) MCG/ACT inhaler Inhale 1-2 puffs into the lungs every 4 (four) hours as needed for wheezing or shortness of breath.  . budesonide-formoterol (SYMBICORT) 160-4.5 MCG/ACT inhaler Inhale 2 puffs into the lungs every morning.  . clobetasol cream (TEMOVATE) 9.83 % Apply 1 application topically every morning.  . docusate sodium (COLACE) 100 MG capsule Take 1 capsule (100 mg total) by mouth 2 (two) times daily as needed for mild constipation.  Marland Kitchen lisinopril (PRINIVIL,ZESTRIL) 30 MG tablet Take 30 mg by mouth at bedtime.  Marland Kitchen loratadine (CLARITIN) 10 MG tablet Take 10 mg by mouth daily as needed for allergies.   . montelukast (SINGULAIR) 10 MG tablet Take 1 tablet (10 mg total) by mouth at bedtime.  Marland Kitchen omeprazole (PRILOSEC) 40 MG capsule Take 1 capsule (40 mg total) by mouth 2 (two) times daily.   . Triamcinolone Acetonide (NASACORT ALLERGY 24HR NA) Place 1 spray into the nose daily as needed (congestion/allergies.).  . [DISCONTINUED] cephALEXin (KEFLEX) 500 MG capsule Take 1 capsule (500 mg total) by mouth 4 (four) times daily.  . [DISCONTINUED] DULoxetine (CYMBALTA) 60 MG capsule Take 60 mg by mouth daily.  . [DISCONTINUED] methocarbamol (ROBAXIN) 500 MG tablet Take 1 tablet (500 mg total) by mouth every 8 (eight) hours as needed for muscle spasms.  . [DISCONTINUED] omeprazole (PRILOSEC) 40 MG capsule Take 40 mg by mouth 2 (two) times daily.  . [DISCONTINUED] oxyCODONE-acetaminophen (PERCOCET) 5-325 MG per tablet Take 1 tablet by mouth every 4 (four) hours as needed.    Allergies: Patient is allergic to codeine and asa [aspirin]. Past Medical History Patient  has a past medical history of Anxiety, Arthritis, Asthma, Depression, Former heavy tobacco smoker (02/25/2018), Genital warts, GERD (gastroesophageal reflux disease), and Hypertension. Past Surgical History Patient  has  a past surgical history that includes Tubal ligation; Shoulder arthroscopy with subacromial decompression (Left, 11/18/2014); and Knee surgery. Family History: Patient family history includes ADD / ADHD in her son; Alcohol abuse in her mother; Asthma in her father; Bipolar disorder in her mother; Breast cancer in her maternal grandmother and paternal grandmother; Diabetes in her father and mother; Healthy in her brother and son; Heart disease in her father and mother; Hypertension in her father and mother; Lung cancer in her mother; Stroke in her maternal grandmother. Social History:  Patient  reports that she quit smoking about 3 years ago. Her smoking use included cigarettes. She has a 45.00 pack-year smoking history. She has never used smokeless tobacco. She reports that she drinks alcohol. She reports that she does not use drugs.  Review of Systems: Constitutional: negative for fever or malaise Ophthalmic:  negative for photophobia, double vision or loss of vision Cardiovascular: negative for chest pain, dyspnea on exertion, or new LE swelling Respiratory: negative for SOB or persistent cough Gastrointestinal: negative for abdominal pain, change in bowel habits or melena Genitourinary: negative for dysuria or gross hematuria Musculoskeletal: negative for new gait disturbance or muscular weakness Integumentary: negative for new or persistent rashes Neurological: negative for TIA or stroke symptoms Psychiatric: negative for SI or delusions Allergic/Immunologic: negative for hives  Patient Care Team    Relationship Specialty Notifications Start End  Leamon Arnt, MD PCP - General Family Medicine  02/25/18     Objective  Vitals: BP 132/82   Pulse 69   Temp 97.9 F (36.6 C)   Ht 5\' 7"  (1.702 m)   Wt 236 lb 3.2 oz (107.1 kg)   LMP 12/16/2017   SpO2 98%   BMI 36.99 kg/m  General:  Well developed, well nourished, no acute distress  Psych:  Alert and oriented,normal mood and affect HEENT:  Normocephalic, atraumatic, non-icteric sclera, PERRL, oropharynx is without mass or exudate, supple neck without adenopathy, mass or thyromegaly Cardiovascular:  RRR without gallop, rub or murmur, nondisplaced PMI Respiratory:  Good breath sounds bilaterally, CTAB with normal respiratory effort Gastrointestinal: normal bowel sounds, soft, non-tender, no noted internal masses. No HSM, possible small palpable lipoma superior to umbilicus MSK: no deformities, contusions. Joints are without erythema or swelling Skin:  Warm, no rashes or suspicious lesions noted Neurologic:    Mental status is normal.  Normal gait   Commons side effects, risks, benefits, and alternatives for medications and treatment plan prescribed today were discussed, and the patient expressed understanding of the given instructions. Patient is instructed to call or message via MyChart if he/she has any questions or concerns regarding our  treatment plan. No barriers to understanding were identified. We discussed Red Flag symptoms and signs in detail. Patient expressed understanding regarding what to do in case of urgent or emergency type symptoms.   Medication list was reconciled, printed and provided to the patient in AVS. Patient instructions and summary information was reviewed with the patient as documented in the AVS. This note was prepared with assistance of Dragon voice recognition software. Occasional wrong-word or sound-a-like substitutions may have occurred due to the inherent limitations of voice recognition software

## 2018-02-25 NOTE — Patient Instructions (Signed)
Please return in 4 weeks for your annual complete physical; please come fasting.  We will call you with information regarding your referral appointment. Mammogram and [facility] for colonoscopy.  If you do not hear from [place] within the next 2 weeks, please let me know. It can take 1-2 weeks to get appointments set up with the specialists.   We are decreasing your cymbalta dose and will try to change it to help with side effects.   It was a pleasure meeting you today! Thank you for choosing [place] to meet your healthcare needs! I truly look forward to working with you. If you have any questions or concerns, please send me a message via Mychart or call the office at [phone].

## 2018-03-10 ENCOUNTER — Telehealth: Payer: Self-pay | Admitting: Family Medicine

## 2018-03-10 NOTE — Telephone Encounter (Signed)
Refill of albuterol by historical provider  LOV 02/25/18 Dr[facility] [phone] - , [place] - [address] AT [address]        [phone] (Phone)

## 2018-03-10 NOTE — Telephone Encounter (Signed)
Copied from [place] [id number]. Topic: General - Other >> Mar 10, 2018  4:16 PM Janace Aris A wrote: Medication: albuterol (PROVENTIL HFA;VENTOLIN HFA) 108 (90 BASE) MCG/ACT inhaler   Has the patient contacted their pharmacy?Yes, Order has expired. need a new RX sent to Pharmacy.   Preferred Pharmacy (with phone number or street name): [facility] [place], [place] AT [address]  [phone] (Phone) [phone] (Fax)

## 2018-03-11 MED ORDER — ALBUTEROL SULFATE HFA 108 (90 BASE) MCG/ACT IN AERS: 1.0000 | INHALATION_SPRAY | RESPIRATORY_TRACT | 2 refills | Status: DC | PRN

## 2018-03-11 NOTE — Telephone Encounter (Signed)
Received and reviewed medication refill request.  Request is appropriate and was approved.  Please see medication orders for details.  

## 2018-03-26 ENCOUNTER — Ambulatory Visit: Payer: BC Managed Care – PPO

## 2018-03-27 ENCOUNTER — Other Ambulatory Visit (HOSPITAL_COMMUNITY)
Admission: RE | Admit: 2018-03-27 | Discharge: 2018-03-27 | Disposition: A | Discharge status: Home / Self Care | Payer: BC Managed Care – PPO | Source: Ambulatory Visit | Attending: Family Medicine | Admitting: Family Medicine

## 2018-03-27 ENCOUNTER — Encounter: Payer: Self-pay | Admitting: Family Medicine

## 2018-03-27 ENCOUNTER — Other Ambulatory Visit: Payer: Self-pay

## 2018-03-27 ENCOUNTER — Ambulatory Visit (INDEPENDENT_AMBULATORY_CARE_PROVIDER_SITE_OTHER): Payer: BC Managed Care – PPO | Admitting: Family Medicine

## 2018-03-27 VITALS — BP 130/76 | HR 68 | Temp 97.9°F | Ht 67.0 in | Wt 230.8 lb

## 2018-03-27 DIAGNOSIS — Z0000 Encounter for general adult medical examination without abnormal findings: Secondary | ICD-10-CM | POA: Diagnosis not present

## 2018-03-27 DIAGNOSIS — E661 Drug-induced obesity: Secondary | ICD-10-CM | POA: Diagnosis not present

## 2018-03-27 DIAGNOSIS — I10 Essential (primary) hypertension: Secondary | ICD-10-CM

## 2018-03-27 DIAGNOSIS — F411 Generalized anxiety disorder: Secondary | ICD-10-CM | POA: Diagnosis not present

## 2018-03-27 DIAGNOSIS — Z124 Encounter for screening for malignant neoplasm of cervix: Secondary | ICD-10-CM

## 2018-03-27 DIAGNOSIS — E669 Obesity, unspecified: Secondary | ICD-10-CM | POA: Insufficient documentation

## 2018-03-27 DIAGNOSIS — Z6835 Body mass index (BMI) 35.0-35.9, adult: Secondary | ICD-10-CM

## 2018-03-27 LAB — CBC WITH DIFFERENTIAL/PLATELET
BASOS ABS: 0 10*3/uL (ref 0.0–0.1)
Basophils Relative: 0.7 % (ref 0.0–3.0)
EOS ABS: 0.1 10*3/uL (ref 0.0–0.7)
Eosinophils Relative: 2.6 % (ref 0.0–5.0)
HEMATOCRIT: 38.1 % (ref 36.0–46.0)
Hemoglobin: 12.6 g/dL (ref 12.0–15.0)
LYMPHS ABS: 1.2 10*3/uL (ref 0.7–4.0)
LYMPHS PCT: 31.5 % (ref 12.0–46.0)
MCHC: 33 g/dL (ref 30.0–36.0)
MCV: 90.2 fl (ref 78.0–100.0)
Monocytes Absolute: 0.3 10*3/uL (ref 0.1–1.0)
Monocytes Relative: 8.5 % (ref 3.0–12.0)
NEUTROS PCT: 56.7 % (ref 43.0–77.0)
Neutro Abs: 2.1 10*3/uL (ref 1.4–7.7)
Platelets: 284 10*3/uL (ref 150.0–400.0)
RBC: 4.22 Mil/uL (ref 3.87–5.11)
RDW: 14.1 % (ref 11.5–15.5)
WBC: 3.7 10*3/uL — ABNORMAL LOW (ref 4.0–10.5)

## 2018-03-27 LAB — COMPREHENSIVE METABOLIC PANEL
ALT: 18 U/L (ref 0–35)
AST: 17 U/L (ref 0–37)
Albumin: 4.3 g/dL (ref 3.5–5.2)
Alkaline Phosphatase: 71 U/L (ref 39–117)
BUN: 12 mg/dL (ref 6–23)
CO2: 30 mEq/L (ref 19–32)
CREATININE: 0.57 mg/dL (ref 0.40–1.20)
Calcium: 9.6 mg/dL (ref 8.4–10.5)
Chloride: 101 mEq/L (ref 96–112)
GFR: 118.86 mL/min (ref >60.00)
GLUCOSE: 110 mg/dL — AB (ref 70–99)
Potassium: 4.6 mEq/L (ref 3.5–5.1)
Sodium: 137 mEq/L (ref 135–145)
Total Bilirubin: 0.7 mg/dL (ref 0.2–1.2)
Total Protein: 6.9 g/dL (ref 6.0–8.3)

## 2018-03-27 LAB — LIPID PANEL
CHOL/HDL RATIO: 2
Cholesterol: 178 mg/dL (ref 0–200)
HDL: 79.9 mg/dL (ref >39.00)
LDL Cholesterol: 87 mg/dL (ref 0–99)
NONHDL: 98.28
Triglycerides: 57 mg/dL (ref 0.0–149.0)
VLDL: 11.4 mg/dL (ref 0.0–40.0)

## 2018-03-27 NOTE — Progress Notes (Signed)
Please add on a1c, dx: hyperglycemia Thanks, Dr. Birtie Fellman '

## 2018-03-27 NOTE — Progress Notes (Signed)
Subjective  Chief Complaint  Patient presents with  . Annual Exam    doing well, no complaints. Pap today. Mammogram is scheduled for November     HPI: Teresa Dunn is a 52 y.o. female who presents to [place] [facility] today for a Female Wellness Visit. She also has the concerns and/or needs as listed above in the chief complaint. These will be addressed in addition to the Health Maintenance Visit.   Wellness Visit: annual visit with health maintenance review and exam with Pap   Health maintenance: Schedule mammogram and has colonoscopy referral appointment scheduled.  Due today for Pap smear.  Complete lab work.  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  Very stressed but a positive stress overall.  Has had some GERD related symptoms due to stress and food and mild weight loss, trying to eat better.  Immunizations are up-to-date. Chronic disease f/u and/or acute problem visit: (deemed necessary to be done in addition to the wellness visit):  Chronic medical problems: Recent appointment showed good control.  Hypertension: Due for lab testing today.  No chest pain  General anxiety disorder: Decrease Cymbalta from 60 to 30 mg daily.  Had some slight increase in anxiety at first but now managing well.  Ultimately, would like to change to different medication to avoid side effects including hot flashes and weight gain.  Had history of occasional panic attack, none recently.  Assessment  1. Annual physical exam   2. Generalized anxiety disorder   3. Class 2 drug-induced obesity with serious comorbidity and body mass index (BMI) of 35.0 to 35.9 in adult   4. Cervical cancer screening   5. Essential hypertension      Plan  Female Wellness Visit:  Age appropriate Health Maintenance and Prevention measures were discussed with patient. Included topics are cancer screening recommendations, ways to keep healthy (see AVS) including  dietary and exercise recommendations, regular eye and dental care, use of seat belts, and avoidance of moderate alcohol use and tobacco use.  Pap smear and high-risk HPV typing done today.  Mammogram and colonoscopy to be done in the near future.  BMI: discussed patient's BMI and encouraged positive lifestyle modifications to help get to or maintain a target BMI.  Continue to work on improving diet for weight loss  HM needs and immunizations were addressed and ordered. See below for orders. See HM and immunization section for updates.  Routine labs and screening tests ordered including cmp, cbc and lipids where appropriate.  Discussed recommendations regarding Vit D and calcium supplementation (see AVS)  Chronic disease management visit and/or acute problem visit:  Hypertension is well controlled check lab work today including lipids  General anxiety disorder: Continue Cymbalta 30 for the next 4 to 6 weeks.  Return for recheck.  At that time further wean and trial of Zoloft to be initiated.  Defer change at this point due to work-related stressors. Follow up: Return in about 6 weeks (around 05/08/2018) for mood follow up.  Orders Placed This Encounter  Procedures  . CBC with Differential/Platelet  . Comprehensive metabolic panel  . Lipid panel  . HIV Antibody (routine testing w rflx)   No orders of the defined types were placed in this encounter.     Lifestyle: Body mass index is 36.15 kg/m. Wt Readings from Last 3 Encounters:  03/27/18 230 lb 12.8 oz (104.7 kg)  02/25/18 236 lb 3.2 oz (107.1 kg)  05/25/16 220 lb (99.8 kg)   Diet: general Exercise: rarely,   Patient Active Problem List   Diagnosis Date Noted  . Class 2 drug-induced obesity with serious comorbidity and body mass index (BMI) of 35.0 to 35.9 in adult 03/27/2018  . Essential hypertension 02/25/2018  . Moderate persistent asthma 02/25/2018  . Lipoma of abdominal wall 02/25/2018  . Former heavy tobacco smoker  02/25/2018    1.5 ppd x 30 years. Quit 2016   . 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 Plantar fasciitis of left foot 11/30/2016  . Tear of medial meniscus of left knee 11/30/2016  . Perennial allergic rhinitis 08/07/2012  . 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 GERD (gastroesophageal reflux disease) 06/26/2012  . Major depressive disorder, recurrent episode ([place]) 10/23/2010    Overview:  Chronic Major Depression  10/1 IMO update   . Generalized anxiety disorder 10/10/2010    cymbalta - weight gain and hot flushes wellbutrin failed    Health Maintenance  Topic Date Due  . HIV Screening  04/05/1982  . MAMMOGRAM  11/19/2010  . PAP SMEAR  06/27/2011  . COLONOSCOPY  04/05/2017  . TETANUS/TDAP  06/26/2021  . INFLUENZA VACCINE  Completed   Immunization History  Administered Date(s) Administered  . Influenza Split 03/26/2012  . Influenza,inj,Quad PF,6+ Mos 02/25/2018  . Influenza,trivalent, recombinat, inj, PF 03/29/2011  . Pneumococcal Conjugate-13 06/27/2007  . Tdap 06/27/2011   We updated and reviewed the patient's past history in detail and it is documented below. Allergies: Patient is allergic to codeine and asa [aspirin]. Past Medical History Patient  has a past medical history of Anxiety, Arthritis, Asthma, Cervical dysplasia (1990), Depression, Former heavy tobacco smoker (02/25/2018), Genital warts, GERD (gastroesophageal reflux disease), and Hypertension. Past Surgical History Patient  has a past surgical history that includes Tubal ligation; Shoulder arthroscopy with subacromial decompression (Left, 11/18/2014); Knee surgery; and Cervix lesion destruction (1990). Family History: Patient family history includes ADD / ADHD in her son; Alcohol abuse in her mother; Asthma in her father; Bipolar disorder in her mother; Breast cancer in her maternal grandmother and paternal grandmother; Diabetes in her father and mother; Healthy in her brother and son; Heart disease  in her father and mother; Hypertension in her father and mother; Lung cancer in her mother; Stroke in her maternal grandmother. Social History:  Patient  reports that she quit smoking about 3 years ago. Her smoking use included cigarettes. She has a 45.00 pack-year smoking history. She has never used smokeless tobacco. She reports that she drinks alcohol. She reports that she does not use drugs.  Review of Systems: Constitutional: negative for fever or malaise Ophthalmic: negative for photophobia, double vision or loss of vision Cardiovascular: negative for chest pain, dyspnea on exertion, or new LE swelling Respiratory: negative for SOB or persistent cough Gastrointestinal: negative for abdominal pain, change in bowel habits or melena Genitourinary: negative for dysuria or gross hematuria, no abnormal uterine bleeding or disharge Musculoskeletal: negative for new gait disturbance or muscular weakness Integumentary: negative for new or persistent rashes, no breast lumps Neurological: negative for TIA or stroke symptoms Psychiatric: negative for SI or delusions Allergic/Immunologic: negative for hives  Patient Care Team    Relationship Specialty Notifications Start End  Leamon Arnt, MD PCP - General Family Medicine  02/25/18     Objective  Vitals: BP 130/76   Pulse 68   Temp 97.9 F (36.6 C)   Ht 5\' 7"  (1.702 m)   Wt 230 lb 12.8 oz (  104.7 kg)   SpO2 98%   BMI 36.15 kg/m  General:  Well developed, well nourished, no acute distress  Psych:  Alert and orientedx3,normal mood and affect HEENT:  Normocephalic, atraumatic, non-icteric sclera, PERRL, oropharynx is clear without mass or exudate, supple neck without adenopathy, mass or thyromegaly Cardiovascular:  Normal S1, S2, RRR without gallop, rub or murmur, nondisplaced PMI Respiratory:  Good breath sounds bilaterally, CTAB with normal respiratory effort Gastrointestinal: normal bowel sounds, soft, non-tender, no noted masses. No  HSM MSK: no deformities, contusions. Joints are without erythema or swelling. Spine and CVA region are nontender Skin:  Warm, no rashes or suspicious lesions noted Neurologic:    Mental status is normal. CN 2-11 are normal. Gross motor and sensory exams are normal. Normal gait. No tremor Breast Exam: No mass, skin retraction or nipple discharge is appreciated in either breast. No axillary adenopathy. Fibrocystic changes are not noted Pelvic Exam: Normal external genitalia, no vulvar or vaginal lesions present. Clear cervix w/o CMT. Bimanual exam reveals a nontender fundus w/o masses, nl size. No adnexal masses present. No inguinal adenopathy. A PAP smear was performed.    Commons side effects, risks, benefits, and alternatives for medications and treatment plan prescribed today were discussed, and the patient expressed understanding of the given instructions. Patient is instructed to call or message via MyChart if he/she has any questions or concerns regarding our treatment plan. No barriers to understanding were identified. We discussed Red Flag symptoms and signs in detail. Patient expressed understanding regarding what to do in case of urgent or emergency type symptoms.   Medication list was reconciled, printed and provided to the patient in AVS. Patient instructions and summary information was reviewed with the patient as documented in the AVS. This note was prepared with assistance of Dragon voice recognition software. Occasional wrong-word or sound-a-like substitutions may have occurred due to the inherent limitations of voice recognition software

## 2018-03-27 NOTE — Patient Instructions (Signed)
Please return in 6 weeks for mood recheck.  Sooner if anxiety is worsening on lower dose of cymbalta.  I will release your lab results to you on your MyChart account with further instructions. Please reply with any questions. Please sign up for Mychart.   If you have any questions or concerns, please don't hesitate to send me a message via MyChart or call the office at [phone]. Thank you for visiting with [place] today! It's our pleasure caring for you.  Please do these things to maintain good health!   Exercise at least 30-45 minutes a day,  4-5 days a week.   Eat a low-fat diet with lots of fruits and vegetables, up to 7-9 servings per day.  Drink plenty of water daily. Try to drink 8 8oz glasses per day.  Seatbelts can save your life. Always wear your seatbelt.  Place Smoke Detectors on every level of your home and check batteries every year.  Schedule an appointment with an eye doctor for an eye exam every 1-2 years  Safe sex - use condoms to protect yourself from STDs if you could be exposed to these types of infections. Use birth control if you do not want to become pregnant and are sexually active.  Avoid heavy alcohol use. If you drink, keep it to less than 2 drinks/day and not every day.  [place].  Choose someone you trust that could speak for you if you became unable to speak for yourself.  Depression is common in our stressful world.If you're feeling down or losing interest in things you normally enjoy, please come in for a visit.  If anyone is threatening or hurting you, please get help. Physical or Emotional Violence is never OK.

## 2018-03-28 ENCOUNTER — Other Ambulatory Visit (INDEPENDENT_AMBULATORY_CARE_PROVIDER_SITE_OTHER): Payer: BC Managed Care – PPO

## 2018-03-28 DIAGNOSIS — R7309 Other abnormal glucose: Secondary | ICD-10-CM | POA: Diagnosis not present

## 2018-03-28 LAB — CYTOLOGY - PAP
DIAGNOSIS: NEGATIVE
HPV: NOT DETECTED

## 2018-03-28 LAB — HIV ANTIBODY (ROUTINE TESTING W REFLEX): HIV: NONREACTIVE

## 2018-03-28 LAB — HEMOGLOBIN A1C: HEMOGLOBIN A1C: 5.6 % (ref 4.6–6.5)

## 2018-03-28 NOTE — Progress Notes (Signed)
Please call patient: I have reviewed his/her lab results. Lab tests are all normal. Pap is normal. Sugar test is just above normal so work on decreasing sugars and sweets in diet. Work on weight loss.

## 2018-04-10 ENCOUNTER — Encounter: Payer: Self-pay | Admitting: Emergency Medicine

## 2018-04-28 ENCOUNTER — Encounter: Payer: Self-pay | Admitting: Family Medicine

## 2018-05-08 ENCOUNTER — Ambulatory Visit: Payer: BC Managed Care – PPO | Admitting: Family Medicine

## 2018-05-14 ENCOUNTER — Other Ambulatory Visit: Payer: Self-pay | Admitting: Family Medicine

## 2018-05-14 NOTE — Telephone Encounter (Signed)
Copied from [place] [id number]. Topic: Quick Communication - Rx Refill/Question >> May 14, 2018 10:50 AM Burchel, Abbi R wrote: Medication:  lisinopril (PRINIVIL,ZESTRIL) 30 MG tablet   Preferred Pharmacy: [facility] [place], [place] [address] AT [address] [place] [address] Phone: [phone] Fax: [phone]  Pt is requesting 4-5 doses of this medication to get her through to her appt on 05/20/18 bc she is completely out, and cannot see Dr Jonni Sanger until the 3rd.  Pt was advised that RX refills may take up to 3 business days. We ask that you follow-up with your pharmacy.

## 2018-05-16 NOTE — Telephone Encounter (Signed)
Pt calling to check status. Please advise  °

## 2018-05-16 NOTE — Telephone Encounter (Signed)
Requested medication (s) are due for refill today: yes  Requested medication (s) are on the active medication list: yes  Last refill:  Last filled by historical provider  Future visit scheduled: yes  Notes to clinic:  Unable to refill per protocol due to medication being previously filled by historical provider    Requested Prescriptions  Pending Prescriptions Disp Refills   lisinopril (PRINIVIL,ZESTRIL) 30 MG tablet      Sig: Take 1 tablet (30 mg total) by mouth at bedtime.     Cardiovascular:  ACE Inhibitors Passed - 05/16/2018 10:42 AM      Passed - Cr in normal range and within 180 days    Creatinine, Ser  Date Value Ref Range Status  03/27/2018 0.57 0.40 - 1.20 mg/dL Final         Passed - K in normal range and within 180 days    Potassium  Date Value Ref Range Status  03/27/2018 4.6 3.5 - 5.1 mEq/L Final         Passed - Patient is not pregnant      Passed - Last BP in normal range    BP Readings from Last 1 Encounters:  03/27/18 130/76         Passed - Valid encounter within last 6 months    Recent Outpatient Visits          1 month ago Annual physical exam   [facility] [place], Karie Fetch, MD   2 months ago Essential hypertension   [facility] [place], MD   5 years ago Essential hypertension, benign   [facility] [place], MD   5 years ago Unspecified essential hypertension   [facility], Alinda Sierras, MD   5 years ago Mixed headache   Therapist, [facility] at [facility], Alinda Sierras, MD      Future Appointments            In 4 days Leamon Arnt, MD [facility] [place], [facility]

## 2018-05-19 ENCOUNTER — Ambulatory Visit: Payer: BC Managed Care – PPO | Admitting: Family Medicine

## 2018-05-19 ENCOUNTER — Other Ambulatory Visit: Payer: Self-pay

## 2018-05-19 ENCOUNTER — Encounter: Payer: Self-pay | Admitting: Family Medicine

## 2018-05-19 VITALS — BP 130/88 | HR 75 | Temp 97.8°F | Ht 67.0 in | Wt 229.4 lb

## 2018-05-19 DIAGNOSIS — I10 Essential (primary) hypertension: Secondary | ICD-10-CM

## 2018-05-19 DIAGNOSIS — R7301 Impaired fasting glucose: Secondary | ICD-10-CM | POA: Diagnosis not present

## 2018-05-19 DIAGNOSIS — F331 Major depressive disorder, recurrent, moderate: Secondary | ICD-10-CM

## 2018-05-19 DIAGNOSIS — F411 Generalized anxiety disorder: Secondary | ICD-10-CM | POA: Diagnosis not present

## 2018-05-19 MED ORDER — LISINOPRIL 30 MG PO TABS: 30.0000 mg | ORAL_TABLET | Freq: Every day | ORAL | 3 refills | Status: DC

## 2018-05-19 MED ORDER — MONTELUKAST SODIUM 10 MG PO TABS: 10.0000 mg | ORAL_TABLET | Freq: Every day | ORAL | 11 refills | Status: DC

## 2018-05-19 MED ORDER — DULOXETINE HCL 30 MG PO CPEP: 30.0000 mg | ORAL_CAPSULE | Freq: Every day | ORAL | 0 refills | Status: DC

## 2018-05-19 MED ORDER — SERTRALINE HCL 25 MG PO TABS: ORAL_TABLET | ORAL | 1 refills | Status: DC

## 2018-05-19 NOTE — Patient Instructions (Addendum)
Please return in 6-8 weeks for mood follow up.   Please call to set up a mammogram and a colonoscopy appointment.   If you have any questions or concerns, please don't hesitate to send me a message via MyChart or call the office at [phone]. Thank you for visiting with [place] today! It's our pleasure caring for you.  Wean cymbalta as follows: Take 30mg  every other day for a week, Then decrease to M,W,F for a week, Then start the zoloft daily along with cymbalta on T, and Sat. conitinue the daily zoloft and stop the cymbalta.

## 2018-05-19 NOTE — Progress Notes (Signed)
Subjective  CC:  Chief Complaint  Patient presents with  . Anxiety    doing okay, having some dizziness today but she has been out of her blood pressure medication for 2 days.     HPI: Teresa Dunn is a 52 y.o. female who presents to the office today to address the problems listed above in the chief complaint, mood problems. Here for f/u: wanting to wean off cymbalta and change to zoloft for better anxiety/depression control and less side effects. Feeling hot and flushed today. Mild increase in anxiety sxs since decreasing cymbalta dose but manageable.   Reviewed fasting glucose of 110, a1c 5.6. Reviewed diet.   HTN is controlled but out of medications.   Assessment  1. Generalized anxiety disorder   2. Moderate episode of recurrent major depressive disorder ([place])   3. Essential hypertension   4. IFG (impaired fasting glucose)      Plan   Depression/anxiety:  Continue wean; see avs. Change to zoloft. F/u 6 - 8 weeks.   HTN is controlled. Refilled meds  IFG: discussed decreasing white starches and processed foods. Increase veggies (she loves them), work on weight loss.   Follow up: Return in about 6 weeks (around 06/30/2018) for mood follow up.  No orders of the defined types were placed in this encounter.  Meds ordered this encounter  Medications  . lisinopril (PRINIVIL,ZESTRIL) 30 MG tablet    Sig: Take 1 tablet (30 mg total) by mouth at bedtime.    Dispense:  90 tablet    Refill:  3  . montelukast (SINGULAIR) 10 MG tablet    Sig: Take 1 tablet (10 mg total) by mouth at bedtime.    Dispense:  30 tablet    Refill:  11  . DULoxetine (CYMBALTA) 30 MG capsule    Sig: Take 1 capsule (30 mg total) by mouth daily.    Dispense:  30 capsule    Refill:  0  . sertraline (ZOLOFT) 25 MG tablet    Sig: Take 1 tablet (25 mg total) by mouth daily for 14 days, THEN 2 tablets (50 mg total) daily for 14 days.    Dispense:  42 tablet    Refill:  1      I reviewed the patients  updated PMH, FH, and SocHx.    Patient Active Problem List   Diagnosis Date Noted  . IFG (impaired fasting glucose) 05/19/2018  . Class 2 drug-induced obesity with serious comorbidity and body mass index (BMI) of 35.0 to 35.9 in adult 03/27/2018  . Essential hypertension 02/25/2018  . Moderate persistent asthma 02/25/2018  . Lipoma of abdominal wall 02/25/2018  . Former heavy tobacco smoker 02/25/2018  . 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 Plantar fasciitis of left foot 11/30/2016  . Tear of medial meniscus of left knee 11/30/2016  . Perennial allergic rhinitis 08/07/2012  . 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 GERD (gastroesophageal reflux disease) 06/26/2012  . Major depressive disorder, recurrent episode ([place]) 10/23/2010  . Generalized anxiety disorder 10/10/2010   Current Meds  Medication Sig  . albuterol (PROVENTIL HFA;VENTOLIN HFA) 108 (90 Base) MCG/ACT inhaler Inhale 1-2 puffs into the lungs every 4 (four) hours as needed for wheezing or shortness of breath.  . budesonide-formoterol (SYMBICORT) 160-4.5 MCG/ACT inhaler Inhale 2 puffs into the lungs every morning.  . clobetasol cream (TEMOVATE) 6.60 % Apply 1 application topically every morning.  . DULoxetine (CYMBALTA) 30 MG capsule Take 1 capsule (30 mg total) by mouth  daily.  . loratadine (CLARITIN) 10 MG tablet Take 10 mg by mouth daily as needed for allergies.   . montelukast (SINGULAIR) 10 MG tablet Take 1 tablet (10 mg total) by mouth at bedtime.  Marland Kitchen omeprazole (PRILOSEC) 40 MG capsule Take 1 capsule (40 mg total) by mouth 2 (two) times daily.  . Triamcinolone Acetonide (NASACORT ALLERGY 24HR NA) Place 1 spray into the nose daily as needed (congestion/allergies.).  . [DISCONTINUED] DULoxetine (CYMBALTA) 30 MG capsule Take 1 capsule (30 mg total) by mouth daily.  . [DISCONTINUED] lisinopril (PRINIVIL,ZESTRIL) 30 MG tablet Take 30 mg by mouth at bedtime.  . [DISCONTINUED] montelukast (SINGULAIR) 10 MG tablet Take  1 tablet (10 mg total) by mouth at bedtime.    Allergies: Patient is allergic to codeine and asa [aspirin]. Family history:  Patient family history includes ADD / ADHD in her son; Alcohol abuse in her mother; Asthma in her father; Bipolar disorder in her mother; Breast cancer in her maternal grandmother and paternal grandmother; Diabetes in her father and mother; Healthy in her brother and son; Heart disease in her father and mother; Hypertension in her father and mother; Lung cancer in her mother; Stroke in her maternal grandmother. Social History   Socioeconomic History  . Marital status: Married    Spouse name: Jeneen Rinks  . Number of children: 2  . Years of education: Not on file  . Highest education level: Not on file  Occupational History  . Occupation: Medical laboratory scientific officer HS    Employer: [employer]  . Occupation: Museum/gallery curator: [place]  Social Needs  . Financial resource strain: Not on file  . Food insecurity:    Worry: Not on file    Inability: Not on file  . Transportation needs:    Medical: Not on file    Non-medical: Not on file  Tobacco Use  . Smoking status: Former Smoker    Packs/day: 1.50    Years: 30.00    Pack years: 45.00    Types: Cigarettes    Last attempt to quit: 04/16/2014    Years since quitting: 4.0  . Smokeless tobacco: Never Used  Substance and Sexual Activity  . Alcohol use: Yes    Comment: socially   . Drug use: No    Comment: used marijuana in past as a teenager  . Sexual activity: Yes    Birth control/protection: Surgical  Lifestyle  . Physical activity:    Days per week: Not on file    Minutes per session: Not on file  . Stress: Not on file  Relationships  . Social connections:    Talks on phone: Not on file    Gets together: Not on file    Attends religious service: Not on file    Active member of club or organization: Not on file    Attends meetings of clubs or  organizations: Not on file    Relationship status: Not on file  Other Topics Concern  . Not on file  Social History Narrative  . Not on file     Review of Systems: Constitutional: Negative for fever malaise or anorexia Cardiovascular: negative for chest pain Respiratory: negative for SOB or persistent cough Gastrointestinal: negative for abdominal pain  Objective  Vitals: BP 130/88   Pulse 75   Temp 97.8 F (36.6 C)   Ht 5\' 7"  (1.702 m)   Wt 229 lb 6.4 oz (104.1 kg)   SpO2 98%  BMI 35.93 kg/m  General: no acute distress, well appearing, no apparent distress, well groomed Psych:  Alert and oriented x 3,anxious. normal affect Cardiovascular:  RRR without murmur or gallop. no peripheral edema Respiratory:  Good breath sounds bilaterally, CTAB with normal respiratory effort Skin:  Warm, no rashes    Commons side effects, risks, benefits, and alternatives for medications and treatment plan prescribed today were discussed, and the patient expressed understanding of the given instructions. Patient is instructed to call or message via MyChart if he/she has any questions or concerns regarding our treatment plan. No barriers to understanding were identified. We discussed Red Flag symptoms and signs in detail. Patient expressed understanding regarding what to do in case of urgent or emergency type symptoms.   Medication list was reconciled, printed and provided to the patient in AVS. Patient instructions and summary information was reviewed with the patient as documented in the AVS. This note was prepared with assistance of Dragon voice recognition software. Occasional wrong-word or sound-a-like substitutions may have occurred due to the inherent limitations of voice recognition software

## 2018-05-20 ENCOUNTER — Ambulatory Visit: Payer: BC Managed Care – PPO | Admitting: Family Medicine

## 2018-06-15 ENCOUNTER — Other Ambulatory Visit: Payer: Self-pay | Admitting: Family Medicine

## 2018-06-16 NOTE — Telephone Encounter (Signed)
Last OV: 05/19/18 Last Fill: 05/19/18 #30 no refills

## 2018-06-24 ENCOUNTER — Other Ambulatory Visit: Payer: Self-pay | Admitting: Family Medicine

## 2018-06-24 MED ORDER — BUDESONIDE-FORMOTEROL FUMARATE 160-4.5 MCG/ACT IN AERO: 2.0000 | INHALATION_SPRAY | Freq: Every morning | RESPIRATORY_TRACT | 1 refills | Status: DC

## 2018-06-24 NOTE — Telephone Encounter (Signed)
Requested medication (s) are due for refill today - historical medication  Requested medication (s) are on the active medication list - yes  Future visit scheduled -yes  Last refill: listed as historical  Notes to clinic: Patient is requesting a historical medication- listed as last filled 3 years ago- sent for provider review  Requested Prescriptions  Pending Prescriptions Disp Refills   budesonide-formoterol (SYMBICORT) 160-4.5 MCG/ACT inhaler 1 Inhaler     Sig: Inhale 2 puffs into the lungs every morning.     Pulmonology:  Combination Products Passed - 06/24/2018 10:50 AM      Passed - Valid encounter within last 12 months    Recent Outpatient Visits          1 month ago Generalized anxiety disorder   [facility] [place], Karie Fetch, MD   2 months ago Annual physical exam   [facility], Karie Fetch, MD   3 months ago Essential hypertension   [facility] [place], MD   5 years ago Essential hypertension, benign   Therapist, music at [facility], Alinda Sierras, MD   5 years ago Unspecified essential hypertension   Therapist, music at [facility], Alinda Sierras, MD      Future Appointments            In 1 week Leamon Arnt, MD [facility] [place], [facility]            Requested Prescriptions  Pending Prescriptions Disp Refills   budesonide-formoterol (SYMBICORT) 160-4.5 MCG/ACT inhaler 1 Inhaler     Sig: Inhale 2 puffs into the lungs every morning.     Pulmonology:  Combination Products Passed - 06/24/2018 10:50 AM      Passed - Valid encounter within last 12 months    Recent Outpatient Visits          1 month ago Generalized anxiety disorder   [facility] [place], Karie Fetch, MD   2 months ago Annual physical exam   [facility] [place], MD   3 months ago Essential hypertension   [facility] [place], MD   5 years ago Essential hypertension, benign   Therapist, music at [place], MD   5 years ago Unspecified essential hypertension   Therapist, music at [facility], Alinda Sierras, MD      Future Appointments            In 1 week Leamon Arnt, MD [facility] [place], [facility]

## 2018-06-24 NOTE — Telephone Encounter (Signed)
Copied from [place] [id number]. Topic: Quick Communication - Rx Refill/Question >> Jun 24, 2018  9:38 AM Rayann Heman wrote: Medication: budesonide-formoterol [facility]) 160-4.5 MCG/ACT inhaler [[id number]]   Has the patient contacted their pharmacy? yes Preferred Pharmacy (with phone number or street name): [facility] [place], [address] AT [address] [place] [phone] (Phone) [phone] (Fax)   Agent: Please be advised that RX refills may take up to 3 business days. We ask that you follow-up with your pharmacy.

## 2018-07-07 ENCOUNTER — Other Ambulatory Visit: Payer: Self-pay

## 2018-07-07 ENCOUNTER — Ambulatory Visit: Payer: BC Managed Care – PPO | Admitting: Family Medicine

## 2018-07-07 ENCOUNTER — Encounter: Payer: Self-pay | Admitting: Family Medicine

## 2018-07-07 VITALS — BP 126/62 | HR 71 | Temp 98.5°F | Resp 16 | Ht 67.0 in | Wt 230.0 lb

## 2018-07-07 DIAGNOSIS — F331 Major depressive disorder, recurrent, moderate: Secondary | ICD-10-CM

## 2018-07-07 DIAGNOSIS — F411 Generalized anxiety disorder: Secondary | ICD-10-CM | POA: Diagnosis not present

## 2018-07-07 MED ORDER — SERTRALINE HCL 50 MG PO TABS: 50.0000 mg | ORAL_TABLET | Freq: Every day | ORAL | 3 refills | Status: DC

## 2018-07-07 NOTE — Progress Notes (Signed)
Subjective  CC:  Chief Complaint  Patient presents with  . Anxiety    States that since switching meds, she has a headache. Medication seems to be helping    HPI: Teresa Dunn is a 53 y.o. female who presents to the office today to address the problems listed above in the chief complaint, mood problems.  Here for follow-up, now on Zoloft titrated up to 50 mg daily.  Her mood and anxiety levels are improved.  Less irritability.  No symptoms of depression.  No longer with hot flushes.  However, having increased headaches.  She has a long history of headaches.  The quality and nature of her headaches are similar to the when she is had in the past.  Usually starting at night.  No migrainous symptoms.  No neurologic deficits.  Improved with Tylenol and/or Advil.  She has seen a headache specialist in the past.  Normal CT of the brain in the past.. Depression screen [facility] 2/9 07/07/2018  Decreased Interest 0  Down, Depressed, Hopeless 0  PHQ - 2 Score 0   GAD 7 : Generalized Anxiety Score 07/07/2018  Nervous, Anxious, on Edge 0  Control/stop worrying 1  Worry too much - different things 1  Trouble relaxing 1  Restless 0  Easily annoyed or irritable 0  Afraid - awful might happen 0  Total GAD 7 Score 3  Anxiety Difficulty Not difficult at all    Assessment  1. Generalized anxiety disorder   2. Moderate episode of recurrent major depressive disorder ([facility])      Plan   Anxiety impression: Now well controlled.  Recommend changing Zoloft to nighttime.  Monitoring headaches.  Working on IT trainer.  Return if worsen.  Recheck 3 months.   Follow up: Return in about 3 months (around 10/06/2018) for mood follow up.  No orders of the defined types were placed in this encounter.  Meds ordered this encounter  Medications  . sertraline (ZOLOFT) 50 MG tablet    Sig: Take 1 tablet (50 mg total) by mouth at bedtime.    Dispense:  90 tablet    Refill:  3      I reviewed the  patients updated PMH, FH, and SocHx.    Patient Active Problem List   Diagnosis Date Noted  . IFG (impaired fasting glucose) 05/19/2018  . Class 2 drug-induced obesity with serious comorbidity and body mass index (BMI) of 35.0 to 35.9 in adult 03/27/2018  . Essential hypertension 02/25/2018  . Moderate persistent asthma 02/25/2018  . Lipoma of abdominal wall 02/25/2018  . Former heavy tobacco smoker 02/25/2018  . 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 Plantar fasciitis of left foot 11/30/2016  . Tear of medial meniscus of left knee 11/30/2016  . Perennial allergic rhinitis 08/07/2012  . 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 GERD (gastroesophageal reflux disease) 06/26/2012  . Major depressive disorder, recurrent episode ([place]) 10/23/2010  . Generalized anxiety disorder 10/10/2010   Current Meds  Medication Sig  . albuterol (PROVENTIL HFA;VENTOLIN HFA) 108 (90 Base) MCG/ACT inhaler Inhale 1-2 puffs into the lungs every 4 (four) hours as needed for wheezing or shortness of breath.  . budesonide-formoterol (SYMBICORT) 160-4.5 MCG/ACT inhaler Inhale 2 puffs into the lungs every morning.  . clobetasol cream (TEMOVATE) 7.49 % Apply 1 application topically every morning.  Marland Kitchen lisinopril (PRINIVIL,ZESTRIL) 30 MG tablet Take 1 tablet (30 mg total) by mouth at bedtime.  Marland Kitchen loratadine (CLARITIN) 10 MG tablet Take 10 mg by  mouth daily as needed for allergies.   . montelukast (SINGULAIR) 10 MG tablet Take 1 tablet (10 mg total) by mouth at bedtime.  Marland Kitchen omeprazole (PRILOSEC) 40 MG capsule Take 1 capsule (40 mg total) by mouth 2 (two) times daily.  . Triamcinolone Acetonide (NASACORT ALLERGY 24HR NA) Place 1 spray into the nose daily as needed (congestion/allergies.).    Allergies: Patient is allergic to codeine and asa [aspirin]. Family history:  Patient family history includes ADD / ADHD in her son; Alcohol abuse in her mother; Asthma in her father; Bipolar disorder in her mother; Breast  cancer in her maternal grandmother and paternal grandmother; Diabetes in her father and mother; Healthy in her brother and son; Heart disease in her father and mother; Hypertension in her father and mother; Lung cancer in her mother; Stroke in her maternal grandmother. Social History   Socioeconomic History  . Marital status: Married    Spouse name: Jeneen Rinks  . Number of children: 2  . Years of education: Not on file  . Highest education level: Not on file  Occupational History  . Occupation: Medical laboratory scientific officer HS    Employer: [employer]  . Occupation: Museum/gallery curator: [place]  Social Needs  . Financial resource strain: Not on file  . Food insecurity:    Worry: Not on file    Inability: Not on file  . Transportation needs:    Medical: Not on file    Non-medical: Not on file  Tobacco Use  . Smoking status: Former Smoker    Packs/day: 1.50    Years: 30.00    Pack years: 45.00    Types: Cigarettes    Last attempt to quit: 04/16/2014    Years since quitting: 4.2  . Smokeless tobacco: Never Used  Substance and Sexual Activity  . Alcohol use: Yes    Comment: socially   . Drug use: No    Comment: used marijuana in past as a teenager  . Sexual activity: Yes    Birth control/protection: Surgical  Lifestyle  . Physical activity:    Days per week: Not on file    Minutes per session: Not on file  . Stress: Not on file  Relationships  . Social connections:    Talks on phone: Not on file    Gets together: Not on file    Attends religious service: Not on file    Active member of club or organization: Not on file    Attends meetings of clubs or organizations: Not on file    Relationship status: Not on file  Other Topics Concern  . Not on file  Social History Narrative  . Not on file     Review of Systems: Constitutional: Negative for fever malaise or anorexia Cardiovascular: negative for chest pain Respiratory:  negative for SOB or persistent cough Gastrointestinal: negative for abdominal pain  Objective  Vitals: BP 126/62   Pulse 71   Temp 98.5 F (36.9 C) (Oral)   Resp 16   Ht 5\' 7"  (1.702 m)   Wt 230 lb (104.3 kg)   LMP 06/17/2018   SpO2 98%   BMI 36.02 kg/m  General: no acute distress, well appearing, no apparent distress, well groomed Psych:  Alert and oriented x 3,normal mood, behavior, speech, dress, and thought processes.    Commons side effects, risks, benefits, and alternatives for medications and treatment plan prescribed today were discussed, and the patient expressed understanding of the  given instructions. Patient is instructed to call or message via MyChart if he/she has any questions or concerns regarding our treatment plan. No barriers to understanding were identified. We discussed Red Flag symptoms and signs in detail. Patient expressed understanding regarding what to do in case of urgent or emergency type symptoms.   Medication list was reconciled, printed and provided to the patient in AVS. Patient instructions and summary information was reviewed with the patient as documented in the AVS. This note was prepared with assistance of Dragon voice recognition software. Occasional wrong-word or sound-a-like substitutions may have occurred due to the inherent limitations of voice recognition software

## 2018-07-07 NOTE — Patient Instructions (Signed)
Please return in 3 months for To recheck your mood.   Change the zoloft to bedtime. Take with a tylenol or advil if needed.  Start your day with 5-10 minutes of deep breathing exercises.  The Calm app on your phone can assist you with thi.   If you have any questions or concerns, please don't hesitate to send me a message via MyChart or call the office at [phone]. Thank you for visiting with [place] today! It's our pleasure caring for you.  Please call GI to set up your appointment to discuss colonoscopy. [facility]. You should have received a letter with their information on it.   Please set up and get your mammogram as well.

## 2018-08-20 ENCOUNTER — Other Ambulatory Visit: Payer: Self-pay | Admitting: *Deleted

## 2018-09-16 ENCOUNTER — Encounter: Payer: Self-pay | Admitting: Family Medicine

## 2018-09-16 ENCOUNTER — Other Ambulatory Visit: Payer: Self-pay

## 2018-09-16 ENCOUNTER — Ambulatory Visit (INDEPENDENT_AMBULATORY_CARE_PROVIDER_SITE_OTHER): Payer: BC Managed Care – PPO | Admitting: Family Medicine

## 2018-09-16 VITALS — BP 131/72

## 2018-09-16 DIAGNOSIS — R7301 Impaired fasting glucose: Secondary | ICD-10-CM

## 2018-09-16 DIAGNOSIS — F331 Major depressive disorder, recurrent, moderate: Secondary | ICD-10-CM

## 2018-09-16 DIAGNOSIS — F411 Generalized anxiety disorder: Secondary | ICD-10-CM

## 2018-09-16 DIAGNOSIS — J3089 Other allergic rhinitis: Secondary | ICD-10-CM | POA: Diagnosis not present

## 2018-09-16 DIAGNOSIS — J4540 Moderate persistent asthma, uncomplicated: Secondary | ICD-10-CM

## 2018-09-16 DIAGNOSIS — I10 Essential (primary) hypertension: Secondary | ICD-10-CM

## 2018-09-16 MED ORDER — TRIAMCINOLONE ACETONIDE 55 MCG/ACT NA AERO: 1.0000 | INHALATION_SPRAY | Freq: Every day | NASAL | 11 refills | Status: DC | PRN

## 2018-09-16 NOTE — Progress Notes (Signed)
I have discussed the procedure for the virtual visit with the patient who has given consent to proceed with assessment and treatment.   Tiara S Simmons, CMA     

## 2018-09-16 NOTE — Progress Notes (Signed)
Virtual Visit via Video Note  Subjective  CC:  Chief Complaint  Patient presents with  . Follow-up    She reports that other than the Pandemic she is doing well with no complaints. Zoloft seems to be working well.  . Anxiety    GAD 7 score today 3  . Depression    PHQ 9 score today 0    I connected with Kandis Nab on 09/16/18 at  1:00 PM EDT by a video enabled telemedicine application and verified that I am speaking with the correct person using two identifiers. Location patient: Home Location provider: [employer], Office Persons participating in the virtual visit: Jamiee, [place], MD Lilli Light, [place] discussed the limitations of evaluation and management by telemedicine and the availability of in person appointments. The patient expressed understanding and agreed to proceed.  HPI:   GAD/Depression. Current symptoms include none. She is feeling well. We had weaned off cymbalta which she had been on for years due to weight gain and hot flushes and switched to zoloft. On zoloft now for 3-4 months and feels like her normal self again. See below. At first, zoloft was giving mild headaches and fatigue. Now talking it in the evenings; no longer with SEs.  She denies current suicidal or homicidal plan or intent.  Depression screen [facility] 2/9 09/16/2018 07/07/2018  Decreased Interest 0 0  Down, Depressed, Hopeless 0 0  PHQ - 2 Score 0 0  Altered sleeping 0 -  Tired, decreased energy 0 -  Change in appetite 0 -  Feeling bad or failure about yourself  0 -  Trouble concentrating 0 -  Moving slowly or fidgety/restless 0 -  Suicidal thoughts 0 -  PHQ-9 Score 0 -  Difficult doing work/chores Not difficult at all -   GAD 7 : Generalized Anxiety Score 09/16/2018 07/07/2018  Nervous, Anxious, on Edge 1 0  Control/stop worrying 0 1  Worry too much - different things 1 1  Trouble relaxing 0 1  Restless 0 0  Easily annoyed or irritable 0 0  Afraid -  awful might happen 1 0  Total GAD 7 Score 3 3  Anxiety Difficulty Not difficult at all Not difficult at all    HTN: home readings remain normal on ace inhibitor. Labs from October reviewed.   IFG: eating well. Drinking plenty of water. No sugar drinks. a1c was 5.6  ashtma and allergy: mildly active now due to spring: needs to restart claritin, nasal spray and symbicort. Using albuterol every other day once. No sxs of infection. + allergy sxs.   Assessment  1. Generalized anxiety disorder   2. IFG (impaired fasting glucose)   3. Moderate episode of recurrent major depressive disorder ([place])   4. Perennial allergic rhinitis   5. Moderate persistent asthma without complication   6. Essential hypertension      Plan   Depression/GAD:  Well controlled. Continue zoloft.   IFG: will recheck in October. Discussed healthy diet changes.   HTN: well controlled.   Ashtma: mildly active due to allergies and non-compliance with maintenance inhaler. Education given. Restart symbicort bid. Add claritin and nasacort. Recheck in office if need for rescue inhaler does not decrease. Discussed goals of therapy.   HM: colonoscopy is schedule for July (summer time) and needs to reschedule mammogram.   I discussed the assessment and treatment plan with the patient. The patient was provided an opportunity to ask questions  and all were answered. The patient agreed with the plan and demonstrated an understanding of the instructions.   The patient was advised to call back or seek an in-person evaluation if the symptoms worsen or if the condition fails to improve as anticipated. Follow up: October for CPE  Visit date not found  Meds ordered this encounter  Medications  . triamcinolone (NASACORT ALLERGY 24HR) 55 MCG/ACT AERO nasal inhaler    Sig: Place 1 spray into the nose daily as needed (congestion/allergies.).    Dispense:  1 Inhaler    Refill:  11      I reviewed the patients updated PMH, FH, and  SocHx.    Patient Active Problem List   Diagnosis Date Noted  . IFG (impaired fasting glucose) 05/19/2018    Priority: High  . Class 2 drug-induced obesity with serious comorbidity and body mass index (BMI) of 35.0 to 35.9 in adult 03/27/2018    Priority: High  . Essential hypertension 02/25/2018    Priority: High  . Former heavy tobacco smoker 02/25/2018    Priority: High  . Generalized anxiety disorder 10/10/2010    Priority: High  . Moderate persistent asthma 02/25/2018    Priority: Medium  . Chondromalacia, left knee 04/18/2017    Priority: Medium  . Chronic mixed headache syndrome 08/07/2012    Priority: Medium  . GERD (gastroesophageal reflux disease) 06/26/2012    Priority: Medium  . Major depressive disorder, recurrent episode ([place]) 10/23/2010    Priority: Medium  . Lipoma of abdominal wall 02/25/2018    Priority: Low  . Plantar fasciitis of left foot 11/30/2016    Priority: Low  . Tear of medial meniscus of left knee 11/30/2016    Priority: Low  . Perennial allergic rhinitis 08/07/2012    Priority: Low   Current Meds  Medication Sig  . albuterol (PROVENTIL HFA;VENTOLIN HFA) 108 (90 Base) MCG/ACT inhaler Inhale 1-2 puffs into the lungs every 4 (four) hours as needed for wheezing or shortness of breath.  . budesonide-formoterol (SYMBICORT) 160-4.5 MCG/ACT inhaler Inhale 2 puffs into the lungs every morning.  . clobetasol cream (TEMOVATE) 1.61 % Apply 1 application topically every morning.  Marland Kitchen lisinopril (PRINIVIL,ZESTRIL) 30 MG tablet Take 1 tablet (30 mg total) by mouth at bedtime.  Marland Kitchen loratadine (CLARITIN) 10 MG tablet Take 10 mg by mouth daily as needed for allergies.   . montelukast (SINGULAIR) 10 MG tablet Take 1 tablet (10 mg total) by mouth at bedtime.  Marland Kitchen omeprazole (PRILOSEC) 40 MG capsule Take 1 capsule (40 mg total) by mouth 2 (two) times daily.  . sertraline (ZOLOFT) 50 MG tablet Take 1 tablet (50 mg total) by mouth at bedtime.  . [DISCONTINUED]  Triamcinolone Acetonide (NASACORT ALLERGY 24HR NA) Place 1 spray into the nose daily as needed (congestion/allergies.).    Allergies: Patient is allergic to codeine and asa [aspirin]. Family History: Patient family history includes ADD / ADHD in her son; Alcohol abuse in her mother; Asthma in her father; Bipolar disorder in her mother; Breast cancer in her maternal grandmother and paternal grandmother; Diabetes in her father and mother; Healthy in her brother and son; Heart disease in her father and mother; Hypertension in her father and mother; Lung cancer in her mother; Stroke in her maternal grandmother. Social History:  Patient  reports that she quit smoking about 4 years ago. Her smoking use included cigarettes. She has a 45.00 pack-year smoking history. She has never used smokeless tobacco. She reports current alcohol use. She  reports that she does not use drugs.  OBJECTIVE/OBSERVATIONS: General: no acute distress, well appearing, no apparent distress, well groomed Psych:  Alert and oriented x 3,normal mood, behavior, speech, dress, and thought processes.  Normal respiration.  Leamon Arnt, MD

## 2018-09-16 NOTE — Patient Instructions (Signed)
Please return in October 2020 for our complete physical.   Will need to schedule mammogram once covid-19 restrictions are lifted.   If you have any questions or concerns, please don't hesitate to send me a message via MyChart or call the office at [phone]. Thank you for visiting with [place] today! It's our pleasure caring for you.

## 2018-09-22 ENCOUNTER — Ambulatory Visit: Payer: Self-pay | Admitting: *Deleted

## 2018-09-22 NOTE — Telephone Encounter (Signed)
FYI

## 2018-09-22 NOTE — Telephone Encounter (Signed)
Pt reports cough, non-productive, SOB with exertion only. States afebrile. H/O asthma, seasonal allergies. States "Had windows open all day yesterday." Reports used inhaler at 0600 and 1015, minimal relief. Voice hoarse, "From coughing, tickle in throat makes me cough." Reports wheezing at times. Speech non-halting during call. No travel, no known exposure. Pt directed to [facility]. [place] called practice and spoke with Levada Dy to alert of disposition. Pt has mask available, will call ahead; states "They know me there, I go there when my asthma acts up."  CB if needed: [phone]  Email verified   Reason for Disposition . MILD difficulty breathing (e.g., minimal/no SOB at rest, SOB with walking, pulse <100)  Answer Assessment - Initial Assessment Questions 1. COVID-19 DIAGNOSIS: "Who made your Coronavirus (COVID-19) diagnosis?" "Was it confirmed by a positive lab test?" If not diagnosed by a HCP, ask "Are there lots of cases (community spread) where you live?" (See public health department website, if unsure)   * MAJOR community spread: high number of cases; numbers of cases are increasing; many people hospitalized.   * MINOR community spread: low number of cases; not increasing; few or no people hospitalized     N/A 2. ONSET: "When did the COVID-19 symptoms start?"      This am 3. WORST SYMPTOM: "What is your worst symptom?" (e.g., cough, fever, shortness of breath, muscle aches)    SOB with exertion only 4. COUGH: "How bad is the cough?"       Intermittent, spells, tickle in throat 5. FEVER: "Do you have a fever?" If so, ask: "What is your temperature, how was it measured, and when did it start?"     none 6. RESPIRATORY STATUS: "Describe your breathing?" (e.g., shortness of breath, wheezing, unable to speak)      Wheezing at times 7. BETTER-SAME-WORSE: "Are you getting better, staying the same or getting worse compared to yesterday?"  If getting worse, ask, "In what way?"     Onset this am 8. HIGH  RISK DISEASE: "Do you have any chronic medical problems?" (e.g., asthma, heart or lung disease, weak immune system, etc.)     asthma 10. OTHER SYMPTOMS: "Do you have any other symptoms?"  (e.g., runny nose, headache, sore throat, loss of smell)       Voice hoarse  Protocols used: CORONAVIRUS (COVID-19) DIAGNOSED OR SUSPECTED-A-AH

## 2018-09-29 ENCOUNTER — Ambulatory Visit: Payer: BC Managed Care – PPO | Admitting: Family Medicine

## 2018-10-07 ENCOUNTER — Other Ambulatory Visit: Payer: Self-pay | Admitting: Family Medicine

## 2018-10-23 ENCOUNTER — Other Ambulatory Visit: Payer: Self-pay

## 2018-10-23 ENCOUNTER — Ambulatory Visit: Payer: Self-pay

## 2018-10-23 ENCOUNTER — Other Ambulatory Visit: Payer: Self-pay | Admitting: Family Medicine

## 2018-10-23 DIAGNOSIS — M25562 Pain in left knee: Secondary | ICD-10-CM

## 2018-11-21 ENCOUNTER — Other Ambulatory Visit: Payer: Self-pay | Admitting: Family Medicine

## 2019-02-10 ENCOUNTER — Ambulatory Visit: Payer: Self-pay

## 2019-02-10 NOTE — Telephone Encounter (Signed)
Pt called stating that for about 3 weeks she has had sharp stabbing pain to her rt lower quadrant of her abdomin.  She states that it is sharp when she stands up and then will start to burn. She has no other symptom.  She denies diarrhea vomiting,constipation and urinary symptoms. She does lift heavy boxes at work approximately 50lbs. Care advice read to patient she verbalized understanding. Call transferred to office for scheduling.  Reason for Disposition . [1] MODERATE pain (e.g., interferes with normal activities) AND [2] pain comes and goes (cramps) AND [3] present > 24 hours  (Exception: pain with Vomiting or Diarrhea - see that Guideline)  Answer Assessment - Initial Assessment Questions 1. LOCATION: "Where does it hurt?"      Lower in the abdomine 2. RADIATION: "Does the pain shoot anywhere else?" (e.g., chest, back)    no 3. ONSET: "When did the pain begin?" (e.g., minutes, hours or days ago)      3weeks 4. SUDDEN: "Gradual or sudden onset?"    gradual 5. PATTERN "Does the pain come and go, or is it constant?"    - If constant: "Is it getting better, staying the same, or worsening?"      (Note: Constant means the pain never goes away completely; most serious pain is constant and it progresses)     - If intermittent: "How long does it last?" "Do you have pain now?"     (Note: Intermittent means the pain goes away completely between bouts)    Comes and goes 6. SEVERITY: "How bad is the pain?"  (e.g., Scale 1-10; mild, moderate, or severe)   - MILD (1-3): doesn't interfere with normal activities, abdomen soft and not tender to touch    - MODERATE (4-7): interferes with normal activities or awakens from sleep, tender to touch    - SEVERE (8-10): excruciating pain, doubled over, unable to do any normal activities     5 7. RECURRENT SYMPTOM: "Have you ever had this type of abdominal pain before?" If so, ask: "When was the last time?" and "What happened that time?"     no 8. CAUSE: "What  do you think is causing the abdominal pain?"    Unsure but does lift a lot 9. RELIEVING/AGGRAVATING FACTORS: "What makes it better or worse?" (e.g., movement, antacids, bowel movement)   Standing makes it hurt 10. OTHER SYMPTOMS: "Has there been any vomiting, diarrhea, constipation, or urine problems?"       no 11. PREGNANCY: "Is there any chance you are pregnant?" "When was your last menstrual period?"       No intermittent July 1  Protocols used: ABDOMINAL PAIN - [facility]

## 2019-02-10 NOTE — Telephone Encounter (Signed)
Noted  

## 2019-02-11 ENCOUNTER — Encounter: Payer: Self-pay | Admitting: Family Medicine

## 2019-02-11 ENCOUNTER — Ambulatory Visit: Payer: BC Managed Care – PPO | Admitting: Family Medicine

## 2019-02-11 ENCOUNTER — Other Ambulatory Visit: Payer: Self-pay

## 2019-02-11 VITALS — BP 134/66 | HR 80 | Temp 97.9°F | Resp 16 | Ht 67.0 in | Wt 245.2 lb

## 2019-02-11 DIAGNOSIS — Z23 Encounter for immunization: Secondary | ICD-10-CM

## 2019-02-11 DIAGNOSIS — R0681 Apnea, not elsewhere classified: Secondary | ICD-10-CM | POA: Diagnosis not present

## 2019-02-11 DIAGNOSIS — R0683 Snoring: Secondary | ICD-10-CM | POA: Diagnosis not present

## 2019-02-11 DIAGNOSIS — R1031 Right lower quadrant pain: Secondary | ICD-10-CM | POA: Diagnosis not present

## 2019-02-11 LAB — COMPREHENSIVE METABOLIC PANEL WITH GFR
ALT: 16 U/L (ref 0–35)
AST: 17 U/L (ref 0–37)
Albumin: 4.5 g/dL (ref 3.5–5.2)
Alkaline Phosphatase: 83 U/L (ref 39–117)
BUN: 16 mg/dL (ref 6–23)
CO2: 25 meq/L (ref 19–32)
Calcium: 9.6 mg/dL (ref 8.4–10.5)
Chloride: 103 meq/L (ref 96–112)
Creatinine, Ser: 0.67 mg/dL (ref 0.40–1.20)
GFR: 92.48 mL/min (ref >60.00)
Glucose, Bld: 117 mg/dL — ABNORMAL HIGH (ref 70–99)
Potassium: 4.1 meq/L (ref 3.5–5.1)
Sodium: 137 meq/L (ref 135–145)
Total Bilirubin: 0.5 mg/dL (ref 0.2–1.2)
Total Protein: 7 g/dL (ref 6.0–8.3)

## 2019-02-11 LAB — CBC WITH DIFFERENTIAL/PLATELET
Basophils Absolute: 0 K/uL (ref 0.0–0.1)
Basophils Relative: 0.5 % (ref 0.0–3.0)
Eosinophils Absolute: 0.1 K/uL (ref 0.0–0.7)
Eosinophils Relative: 1.9 % (ref 0.0–5.0)
HCT: 36.4 % (ref 36.0–46.0)
Hemoglobin: 12 g/dL (ref 12.0–15.0)
Lymphocytes Relative: 21.6 % (ref 12.0–46.0)
Lymphs Abs: 1.4 K/uL (ref 0.7–4.0)
MCHC: 32.8 g/dL (ref 30.0–36.0)
MCV: 88.9 fl (ref 78.0–100.0)
Monocytes Absolute: 0.5 K/uL (ref 0.1–1.0)
Monocytes Relative: 8.6 % (ref 3.0–12.0)
Neutro Abs: 4.2 K/uL (ref 1.4–7.7)
Neutrophils Relative %: 67.4 % (ref 43.0–77.0)
Platelets: 251 K/uL (ref 150.0–400.0)
RBC: 4.09 Mil/uL (ref 3.87–5.11)
RDW: 13.9 % (ref 11.5–15.5)
WBC: 6.3 K/uL (ref 4.0–10.5)

## 2019-02-11 LAB — POCT URINALYSIS DIPSTICK
Bilirubin, UA: NEGATIVE
Blood, UA: NEGATIVE
Glucose, UA: NEGATIVE
Ketones, UA: NEGATIVE
Leukocytes, UA: NEGATIVE
Nitrite, UA: NEGATIVE
Protein, UA: NEGATIVE
Spec Grav, UA: 1.025 (ref 1.010–1.025)
Urobilinogen, UA: 0.2 E.U./dL
pH, UA: 6 (ref 5.0–8.0)

## 2019-02-11 LAB — SEDIMENTATION RATE: Sed Rate: 18 mm/h (ref 0–30)

## 2019-02-11 MED ORDER — DICLOFENAC SODIUM 75 MG PO TBEC: 75.0000 mg | DELAYED_RELEASE_TABLET | Freq: Two times a day (BID) | ORAL | 0 refills | Status: DC

## 2019-02-11 NOTE — Progress Notes (Signed)
Subjective  CC:  Chief Complaint  Patient presents with  . Abdominal Pain    Started 3 weeks ago, got worse over the week.. Denies nausea, vomiting, diarrhea, and constipation.. Pain located at belly button and lower right abdomen  . Gasping for air    Reports that she snores bad, tired, and wakes up gasping for air. Been going on 1 month    HPI: Teresa Dunn is a 53 y.o. female who presents to the office today to address the problems listed above in the chief complaint.  53 yo female c/o RLQ pain; started as throbbing and mild but over last week has worsened in severity. Worse at work: stands at [employer]; lots of twisting. No pain in evening. Sleeping ok. No change in appetite, f/c/s, n/v/d, urinary sxs or trauma. S/o BTL but no other abdominal surgeries. No h/o diverticulitis. She is perimenopausal with intermittent cycles at this point. No h/o fibroids. Married and monogamous.   C/o awakens gasping for air, husband says she stops breathing. + snoring and daytime somnolence.  ? Sleep apnea. She is obese. No cp or sob or DOE. No palpitations.  Assessment  1. Right lower quadrant abdominal pain   2. Snoring   3. Apnea      Plan   RLQ abdominal pain:  Broad differential but most c/w abdominal wall pain/strain. Nsaids. Check labs. Monitor. Further eval for GI or pelvic etiology if persists.   Refer for sleep study.   Follow up: oct for cpe.   Visit date not found  Orders Placed This Encounter  Procedures  . CBC with Differential/Platelet  . Comprehensive metabolic panel  . Sedimentation rate  . Ambulatory referral to Pulmonology  . POCT urinalysis dipstick   Meds ordered this encounter  Medications  . diclofenac (VOLTAREN) 75 MG EC tablet    Sig: Take 1 tablet (75 mg total) by mouth 2 (two) times daily.    Dispense:  30 tablet    Refill:  0      I reviewed the patients updated PMH, FH, and SocHx.    Patient Active Problem List   Diagnosis Date Noted   . IFG (impaired fasting glucose) 05/19/2018    Priority: High  . Class 2 drug-induced obesity with serious comorbidity and body mass index (BMI) of 35.0 to 35.9 in adult 03/27/2018    Priority: High  . Essential hypertension 02/25/2018    Priority: High  . Former heavy tobacco smoker 02/25/2018    Priority: High  . Generalized anxiety disorder 10/10/2010    Priority: High  . Moderate persistent asthma 02/25/2018    Priority: Medium  . Chondromalacia, left knee 04/18/2017    Priority: Medium  . Chronic mixed headache syndrome 08/07/2012    Priority: Medium  . GERD (gastroesophageal reflux disease) 06/26/2012    Priority: Medium  . Major depression, recurrent, chronic ([place]) 10/23/2010    Priority: Medium  . Lipoma of abdominal wall 02/25/2018    Priority: Low  . Plantar fasciitis of left foot 11/30/2016    Priority: Low  . Tear of medial meniscus of left knee 11/30/2016    Priority: Low  . Perennial allergic rhinitis 08/07/2012    Priority: Low   Current Meds  Medication Sig  . albuterol (PROVENTIL HFA;VENTOLIN HFA) 108 (90 Base) MCG/ACT inhaler Inhale 1-2 puffs into the lungs every 4 (four) hours as needed for wheezing or shortness of breath.  . clobetasol cream (TEMOVATE) [id number] % Apply 1 application topically  every morning.  Marland Kitchen lisinopril (PRINIVIL,ZESTRIL) 30 MG tablet Take 1 tablet (30 mg total) by mouth at bedtime.  Marland Kitchen loratadine (CLARITIN) 10 MG tablet Take 10 mg by mouth daily as needed for allergies.   . montelukast (SINGULAIR) 10 MG tablet Take 1 tablet (10 mg total) by mouth at bedtime.  Marland Kitchen omeprazole (PRILOSEC) 40 MG capsule Take 1 capsule (40 mg total) by mouth 2 (two) times daily.  . sertraline (ZOLOFT) 50 MG tablet Take 1 tablet (50 mg total) by mouth at bedtime.  . SYMBICORT 160-4.5 MCG/ACT inhaler INHALE 2 PUFFS INTO THE LUNGS EVERY MORNING  . triamcinolone (NASACORT ALLERGY 24HR) 55 MCG/ACT AERO nasal inhaler Place 1 spray into the nose daily as needed  (congestion/allergies.).    Allergies: Patient is allergic to codeine and asa [aspirin]. Family History: Patient family history includes ADD / ADHD in her son; Alcohol abuse in her mother; Asthma in her father; Bipolar disorder in her mother; Breast cancer in her maternal grandmother and paternal grandmother; Diabetes in her father and mother; Healthy in her brother and son; Heart disease in her father and mother; Hypertension in her father and mother; Lung cancer in her mother; Stroke in her maternal grandmother. Social History:  Patient  reports that she quit smoking about 4 years ago. Her smoking use included cigarettes. She has a 45.00 pack-year smoking history. She has never used smokeless tobacco. She reports current alcohol use. She reports that she does not use drugs.  Review of Systems: Constitutional: Negative for fever malaise or anorexia Cardiovascular: negative for chest pain Respiratory: negative for SOB or persistent cough Gastrointestinal: positive for abdominal pain  Objective  Vitals: BP 134/66   Pulse 80   Temp 97.9 F (36.6 C) (Tympanic)   Resp 16   Ht 5\' 7"  (1.702 m)   Wt 245 lb 3.2 oz (111.2 kg)   LMP 12/20/2018   SpO2 98%   BMI 38.40 kg/m  General: no acute distress , A&Ox3, moves easily. Appears well HEENT: PEERL, conjunctiva normal, Oropharynx moist,neck is supple Cardiovascular:  RRR without murmur or gallop.  Respiratory:  Good breath sounds bilaterally, CTAB with normal respiratory effort Gastrointestinal: soft, flat abdomen, normal active bowel sounds, no palpable masses, no hepatosplenomegaly, no appreciated hernias, localized ttp w/o rebound or guarding in two spots in RLQ. Skin:  Warm, no rashes     Commons side effects, risks, benefits, and alternatives for medications and treatment plan prescribed today were discussed, and the patient expressed understanding of the given instructions. Patient is instructed to call or message via MyChart if  he/she has any questions or concerns regarding our treatment plan. No barriers to understanding were identified. We discussed Red Flag symptoms and signs in detail. Patient expressed understanding regarding what to do in case of urgent or emergency type symptoms.   Medication list was reconciled, printed and provided to the patient in AVS. Patient instructions and summary information was reviewed with the patient as documented in the AVS. This note was prepared with assistance of Dragon voice recognition software. Occasional wrong-word or sound-a-like substitutions may have occurred due to the inherent limitations of voice recognition software

## 2019-02-11 NOTE — Patient Instructions (Addendum)
Please return in October 2020 for your annual complete physical; please come fasting.  Your pain is likely coming from a muscle strain in the abdominal wall and diclofenac should help that. However, many other things could be the cause as well. I will give you the results of your blood work. IF things do not improve, return or let me know.   We will call you with information regarding your referral appointment. Pulmonology for a sleep study.  If you do not hear from [place] within the next 2 weeks, please let me know. It can take 1-2 weeks to get appointments set up with the specialists.   If you have any questions or concerns, please don't hesitate to send me a message via MyChart or call the office at [phone]. Thank you for visiting with [place] today! It's our pleasure caring for you.   Abdominal Pain, Adult  Many things can cause belly (abdominal) pain. Most times, belly pain is not dangerous. Many cases of belly pain can be watched and treated at home. Sometimes belly pain is serious, though. Your doctor will try to find the cause of your belly pain. Follow these instructions at home:  Take over-the-counter and prescription medicines only as told by your doctor. Do not take medicines that help you poop (laxatives) unless told to by your doctor.  Drink enough fluid to keep your pee (urine) clear or pale yellow.  Watch your belly pain for any changes.  Keep all follow-up visits as told by your doctor. This is important. Contact a doctor if:  Your belly pain changes or gets worse.  You are not hungry, or you lose weight without trying.  You are having trouble pooping (constipated) or have watery poop (diarrhea) for more than 2-3 days.  You have pain when you pee or poop.  Your belly pain wakes you up at night.  Your pain gets worse with meals, after eating, or with certain foods.  You are throwing up and cannot keep anything down.  You have a fever. Get help right away if:  Your  pain does not go away as soon as your doctor says it should.  You cannot stop throwing up.  Your pain is only in areas of your belly, such as the right side or the left lower part of the belly.  You have bloody or black poop, or poop that looks like tar.  You have very bad pain, cramping, or bloating in your belly.  You have signs of not having enough fluid or water in your body (dehydration), such as: ? Dark pee, very little pee, or no pee. ? Cracked lips. ? Dry mouth. ? Sunken eyes. ? Sleepiness. ? Weakness. This information is not intended to replace advice given to you by your health care provider. Make sure you discuss any questions you have with your health care provider. Document Released: 11/21/2007 Document Revised: 12/23/2015 Document Reviewed: 11/16/2015 Elsevier Interactive Patient Education  [employer].

## 2019-02-18 ENCOUNTER — Other Ambulatory Visit: Payer: Self-pay | Admitting: Family Medicine

## 2019-02-26 ENCOUNTER — Other Ambulatory Visit: Payer: Self-pay | Admitting: Family Medicine

## 2019-02-26 ENCOUNTER — Telehealth: Payer: Self-pay | Admitting: Family Medicine

## 2019-02-26 NOTE — Telephone Encounter (Signed)
Copied from [place] [id number]. Topic: General - Other >> Feb 25, 2019  4:57 PM Pauline Good wrote: Reason for CRM: pt haven't head from referring office  to sched  her sleep study. Pt want to go to [facility]

## 2019-02-26 NOTE — Telephone Encounter (Signed)
Please put in a new referral for sleep studies.

## 2019-02-27 ENCOUNTER — Other Ambulatory Visit: Payer: Self-pay | Admitting: *Deleted

## 2019-02-27 DIAGNOSIS — R0683 Snoring: Secondary | ICD-10-CM

## 2019-02-27 DIAGNOSIS — R0681 Apnea, not elsewhere classified: Secondary | ICD-10-CM

## 2019-03-11 ENCOUNTER — Institutional Professional Consult (permissible substitution): Payer: Private Health Insurance - Indemnity | Admitting: Neurology

## 2019-03-12 ENCOUNTER — Encounter: Payer: Self-pay | Admitting: Neurology

## 2019-04-02 ENCOUNTER — Ambulatory Visit (INDEPENDENT_AMBULATORY_CARE_PROVIDER_SITE_OTHER): Payer: BC Managed Care – PPO | Admitting: Family Medicine

## 2019-04-02 ENCOUNTER — Encounter: Payer: BC Managed Care – PPO | Admitting: Family Medicine

## 2019-04-02 ENCOUNTER — Encounter: Payer: Self-pay | Admitting: Family Medicine

## 2019-04-02 DIAGNOSIS — I10 Essential (primary) hypertension: Secondary | ICD-10-CM

## 2019-04-02 DIAGNOSIS — F411 Generalized anxiety disorder: Secondary | ICD-10-CM | POA: Diagnosis not present

## 2019-04-02 DIAGNOSIS — K219 Gastro-esophageal reflux disease without esophagitis: Secondary | ICD-10-CM

## 2019-04-02 DIAGNOSIS — Z1211 Encounter for screening for malignant neoplasm of colon: Secondary | ICD-10-CM

## 2019-04-02 DIAGNOSIS — Z1212 Encounter for screening for malignant neoplasm of rectum: Secondary | ICD-10-CM

## 2019-04-02 DIAGNOSIS — J4540 Moderate persistent asthma, uncomplicated: Secondary | ICD-10-CM

## 2019-04-02 DIAGNOSIS — Z1231 Encounter for screening mammogram for malignant neoplasm of breast: Secondary | ICD-10-CM

## 2019-04-02 MED ORDER — MONTELUKAST SODIUM 10 MG PO TABS: 10.0000 mg | ORAL_TABLET | Freq: Every day | ORAL | 11 refills | Status: DC

## 2019-04-02 MED ORDER — LISINOPRIL 30 MG PO TABS: 30.0000 mg | ORAL_TABLET | Freq: Every day | ORAL | 3 refills | Status: DC

## 2019-04-02 MED ORDER — ALBUTEROL SULFATE HFA 108 (90 BASE) MCG/ACT IN AERS: 1.0000 | INHALATION_SPRAY | RESPIRATORY_TRACT | 5 refills | Status: DC | PRN

## 2019-04-02 MED ORDER — SERTRALINE HCL 50 MG PO TABS: 50.0000 mg | ORAL_TABLET | Freq: Every day | ORAL | 3 refills | Status: DC

## 2019-04-02 MED ORDER — BUDESONIDE-FORMOTEROL FUMARATE 160-4.5 MCG/ACT IN AERO: INHALATION_SPRAY | RESPIRATORY_TRACT | 2 refills | Status: DC

## 2019-04-02 MED ORDER — OMEPRAZOLE 40 MG PO CPDR: 40.0000 mg | DELAYED_RELEASE_CAPSULE | Freq: Two times a day (BID) | ORAL | 3 refills | Status: DC

## 2019-04-02 NOTE — Progress Notes (Signed)
Virtual Visit via Video Note  Subjective  CC:  Chief Complaint  Patient presents with  . Anxiety    GAD 7 score today is 5  . Hypertension     I connected with Kandis Nab on 04/02/19 at  3:40 PM EDT by a video enabled telemedicine application and verified that I am speaking with the correct person using two identifiers. Location patient: Home Location provider:  [facility] participating in the virtual visit: Dalila, [place], MD Lilli Light, [place] discussed the limitations of evaluation and management by telemedicine and the availability of in person appointments. The patient expressed understanding and agreed to proceed. HPI: Teresa Dunn is a 53 y.o. female who was contacted today to address the problems listed above in the chief complaint/HTN f/u: Was scheduled for cpe this am but had to cancel due to work issues. Needs refills.   HM: needs mammo and CRC screens. Recent labs were stable. Due for lipids.  . Hypertension f/u: Control is good . Pt reports she is doing well. taking medications as instructed, no medication side effects noted, no TIAs, no chest pain on exertion, no dyspnea on exertion, no swelling of ankles. She denies adverse effects from his BP medications. Compliance with medication is good.  Marland Kitchen GAD: doing very well on the zoloft. Anxiety is well controlled. Coping with work Biomedical engineer well. . Asthma is well controlled.  Rare albuterol use.  Needs refill of Symbicort.  No recent exacerbations. Marland Kitchen GERD remains well controlled on chronic PPI.  No further abdominal pain since last visit.  BP Readings from Last 3 Encounters:  02/11/19 134/66  09/16/18 131/72  07/07/18 126/62   Wt Readings from Last 3 Encounters:  02/11/19 245 lb 3.2 oz (111.2 kg)  07/07/18 230 lb (104.3 kg)  05/19/18 229 lb 6.4 oz (104.1 kg)    Lab Results  Component Value Date   CHOL 178 03/27/2018   Lab Results  Component Value Date    HDL 79.90 03/27/2018   Lab Results  Component Value Date   LDLCALC 87 03/27/2018   Lab Results  Component Value Date   TRIG 57.0 03/27/2018   Lab Results  Component Value Date   CHOLHDL 2 03/27/2018   No results found for: LDLDIRECT Lab Results  Component Value Date   CREATININE 0.67 02/11/2019   BUN 16 02/11/2019   NA 137 02/11/2019   K 4.1 02/11/2019   CL 103 02/11/2019   CO2 25 02/11/2019    The 10-year ASCVD risk score Mikey Bussing DC Jr., et al., 2013) is: 1.1%   Values used to calculate the score:     Age: 25 years     Sex: Female     Is Non-Hispanic African American: No     Diabetic: No     Tobacco smoker: No     Systolic Blood Pressure: [id number] mmHg     Is BP treated: Yes     HDL Cholesterol: 79.9 mg/dL     Total Cholesterol: 178 mg/dL  Assessment  1. Essential hypertension   2. Generalized anxiety disorder   3. Moderate persistent asthma without complication   4. Gastroesophageal reflux disease, unspecified whether esophagitis present   5. Encounter for screening mammogram for malignant neoplasm of breast   6. Screening for colorectal cancer      Plan   Hypertension f/u: Control has been good.  Has not been checking at home.  Continue current medications.  Refill today.  Recent renal electrolytes were normal.  Asthma in general anxiety disorder are also well controlled.  Refill medications.  GERD on chronic PPI, high-dose refilled  Health maintenance: Educated regarding breast cancer screening colon cancer screening options.  Choose Cologuard, mammogram ordered.  She will follow up with for her complete physical in January.  She already had a flu shot.  I discussed the assessment and treatment plan with the patient. The patient was provided an opportunity to ask questions and all were answered. The patient agreed with the plan and demonstrated an understanding of the instructions.   The patient was advised to call back or seek an in-person evaluation if the  symptoms worsen or if the condition fails to improve as anticipated. Follow up: No follow-ups on file.  07/15/2019  Meds ordered this encounter  Medications  . montelukast (SINGULAIR) 10 MG tablet    Sig: Take 1 tablet (10 mg total) by mouth at bedtime.    Dispense:  30 tablet    Refill:  11  . sertraline (ZOLOFT) 50 MG tablet    Sig: Take 1 tablet (50 mg total) by mouth at bedtime.    Dispense:  90 tablet    Refill:  3  . omeprazole (PRILOSEC) 40 MG capsule    Sig: Take 1 capsule (40 mg total) by mouth 2 (two) times daily.    Dispense:  180 capsule    Refill:  3  . budesonide-formoterol (SYMBICORT) 160-4.5 MCG/ACT inhaler    Sig: INHALE 2 PUFFS INTO THE LUNGS EVERY MORNING    Dispense:  10.2 g    Refill:  2  . lisinopril (ZESTRIL) 30 MG tablet    Sig: Take 1 tablet (30 mg total) by mouth at bedtime.    Dispense:  90 tablet    Refill:  3  . albuterol (VENTOLIN HFA) 108 (90 Base) MCG/ACT inhaler    Sig: Inhale 1-2 puffs into the lungs every 4 (four) hours as needed for wheezing or shortness of breath.    Dispense:  18 g    Refill:  5      I reviewed the patients updated PMH, FH, and SocHx.    Patient Active Problem List   Diagnosis Date Noted  . IFG (impaired fasting glucose) 05/19/2018    Priority: High  . Class 2 drug-induced obesity with serious comorbidity and body mass index (BMI) of 35.0 to 35.9 in adult 03/27/2018    Priority: High  . Essential hypertension 02/25/2018    Priority: High  . Former heavy tobacco smoker 02/25/2018    Priority: High  . Generalized anxiety disorder 10/10/2010    Priority: High  . Moderate persistent asthma 02/25/2018    Priority: Medium  . Chondromalacia, left knee 04/18/2017    Priority: Medium  . Chronic mixed headache syndrome 08/07/2012    Priority: Medium  . GERD (gastroesophageal reflux disease) 06/26/2012    Priority: Medium  . Major depression, recurrent, chronic ([place]) 10/23/2010    Priority: Medium  . Lipoma of  abdominal wall 02/25/2018    Priority: Low  . Plantar fasciitis of left foot 11/30/2016    Priority: Low  . Tear of medial meniscus of left knee 11/30/2016    Priority: Low  . Perennial allergic rhinitis 08/07/2012    Priority: Low   Current Meds  Medication Sig  . albuterol (VENTOLIN HFA) 108 (90 Base) MCG/ACT inhaler Inhale 1-2 puffs into the lungs every 4 (four) hours as needed  for wheezing or shortness of breath.  . budesonide-formoterol (SYMBICORT) 160-4.5 MCG/ACT inhaler INHALE 2 PUFFS INTO THE LUNGS EVERY MORNING  . clobetasol cream (TEMOVATE) [id number] % Apply 1 application topically every morning.  Marland Kitchen lisinopril (ZESTRIL) 30 MG tablet Take 1 tablet (30 mg total) by mouth at bedtime.  Marland Kitchen loratadine (CLARITIN) 10 MG tablet Take 10 mg by mouth daily as needed for allergies.   . montelukast (SINGULAIR) 10 MG tablet Take 1 tablet (10 mg total) by mouth at bedtime.  Marland Kitchen omeprazole (PRILOSEC) 40 MG capsule Take 1 capsule (40 mg total) by mouth 2 (two) times daily.  . sertraline (ZOLOFT) 50 MG tablet Take 1 tablet (50 mg total) by mouth at bedtime.  . triamcinolone (NASACORT ALLERGY 24HR) 55 MCG/ACT AERO nasal inhaler Place 1 spray into the nose daily as needed (congestion/allergies.).  . [DISCONTINUED] albuterol (PROVENTIL HFA;VENTOLIN HFA) 108 (90 Base) MCG/ACT inhaler Inhale 1-2 puffs into the lungs every 4 (four) hours as needed for wheezing or shortness of breath.  . [DISCONTINUED] diclofenac (VOLTAREN) 75 MG EC tablet TAKE 1 TABLET(75 MG) BY MOUTH TWICE DAILY  . [DISCONTINUED] lisinopril (PRINIVIL,ZESTRIL) 30 MG tablet Take 1 tablet (30 mg total) by mouth at bedtime.  . [DISCONTINUED] montelukast (SINGULAIR) 10 MG tablet Take 1 tablet (10 mg total) by mouth at bedtime.  . [DISCONTINUED] omeprazole (PRILOSEC) 40 MG capsule TAKE ONE CAPSULE BY MOUTH TWICE DAILY  . [DISCONTINUED] sertraline (ZOLOFT) 50 MG tablet Take 1 tablet (50 mg total) by mouth at bedtime.  . [DISCONTINUED] SYMBICORT 160-4.5  MCG/ACT inhaler INHALE 2 PUFFS INTO THE LUNGS EVERY MORNING    Allergies: Patient is allergic to codeine and asa [aspirin]. Family History: Patient family history includes ADD / ADHD in her son; Alcohol abuse in her mother; Asthma in her father; Bipolar disorder in her mother; Breast cancer in her maternal grandmother and paternal grandmother; Diabetes in her father and mother; Healthy in her brother and son; Heart disease in her father and mother; Hypertension in her father and mother; Lung cancer in her mother; Stroke in her maternal grandmother. Social History:  Patient  reports that she quit smoking about 4 years ago. Her smoking use included cigarettes. She has a 45.00 pack-year smoking history. She has never used smokeless tobacco. She reports current alcohol use. She reports that she does not use drugs.  Review of Systems: Constitutional: Negative for fever malaise or anorexia Cardiovascular: negative for chest pain Respiratory: negative for SOB or persistent cough Gastrointestinal: negative for abdominal pain  OBJECTIVE Vitals: There were no vitals taken for this visit. General: no acute distress , A&Ox3  Leamon Arnt, MD

## 2019-04-21 ENCOUNTER — Ambulatory Visit: Payer: BC Managed Care – PPO | Admitting: Pulmonary Disease

## 2019-04-21 ENCOUNTER — Other Ambulatory Visit: Payer: Self-pay

## 2019-04-21 ENCOUNTER — Encounter: Payer: Self-pay | Admitting: Pulmonary Disease

## 2019-04-21 VITALS — BP 118/70 | HR 77 | Temp 97.2°F | Ht 67.0 in | Wt 241.4 lb

## 2019-04-21 DIAGNOSIS — R0683 Snoring: Secondary | ICD-10-CM

## 2019-04-21 NOTE — Patient Instructions (Signed)
Will arrange for home sleep study Will call to arrange for follow up after sleep study reviewed  

## 2019-04-21 NOTE — Progress Notes (Signed)
[facility]  Chief Complaint  Patient presents with  . Consult    Snoring    Constitutional:  BP 118/70 (BP Location: Right Arm, Patient Position: Sitting, Cuff Size: Normal)   Pulse 77   Temp (!) 97.2 F (36.2 C)   Ht 5\' 7"  (1.702 m)   Wt 241 lb 6.4 oz (109.5 kg)   SpO2 97% Comment: on room air  BMI 37.81 kg/m   Past Medical History:  HTN, GERD, Genital warts, Depression, Asthma, Anxiety, OA, Cervical dysplasia  Brief Summary:  Teresa Dunn is a 53 y.o. female former smoker with snoring.  She has noticed her snoring has gotten worse over the past 6 months.  She will wake up hearing herself snore.  Her husband says she stops breathing at night.  He has sleep apnea and uses CPAP, and is concerned she has the same thing.  She feels tired during the day and has to nap on weekends.  She goes to sleep at 7 pm.  She falls asleep in 10 minutes.  She wakes up 2 times to use the bathroom.  She gets out of bed at 330 am on workdays, and 7 am on weekends.  She feels tired in the morning no matter how long she sleeps.  She denies morning headache.  She does not use anything to help her fall sleep or stay awake.  She denies sleep walking, sleep talking, bruxism, or nightmares.  There is no history of restless legs.  She denies sleep hallucinations, sleep paralysis, or cataplexy.  The Epworth score is 10 out of 24.  She uses symbicort and singulair for her asthma.  Her symptoms are worse during allergy season, but aren't an issue at present with current regimen. Physical Exam:   Appearance - well kempt   ENMT - clear nasal mucosa, midline nasal  septum, no oral exudates, no LAN, trachea midline  Respiratory - normal chest wall, normal respiratory effort, no accessory muscle use, no wheeze/rales  CV - s1s2 regular rate and rhythm, no murmurs, no peripheral edema, radial pulses symmetric  GI - soft, non tender, no masses  Lymph - no adenopathy  noted in neck and axillary areas  MSK - normal gait  Ext - no cyanosis, clubbing, or joint inflammation noted  Skin - no rashes, lesions, or ulcers  Neuro - normal strength, oriented x 3  Psych - normal mood and affect  Discussion:  She has snoring, sleep disruption, witnessed apnea and daytime sleepiness.  She has history of hypertension and depression.  I am concerned she could have sleep apnea.  Assessment/Plan:   Snoring with excessive daytime sleepiness. - will need to arrange for a home sleep study  Obesity. - discussed how weight can impact sleep and risk for sleep disordered breathing - discussed options to assist with weight loss: combination of diet modification, cardiovascular and strength training exercises  Cardiovascular risk. - had an extensive discussion regarding the adverse health consequences related to untreated sleep disordered breathing - specifically discussed the risks for hypertension, coronary artery disease, cardiac dysrhythmias, cerebrovascular disease, and diabetes - lifestyle modification discussed  Safe driving practices. - discussed how sleep disruption can increase risk of accidents, particularly when driving - safe driving practices were discussed  Therapies for obstructive sleep apnea. - if the sleep study shows significant sleep apnea, then various therapies for treatment were reviewed: CPAP, oral appliance, and surgical interventions   Patient Instructions  Will arrange for home  sleep study Will call to arrange for follow up after sleep study reviewed    Chesley Mires, MD [place] Pager: [phone] 04/21/2019, 9:59 AM  Flow Sheet    Sleep tests:    Review of Systems:  Constitutional: Negative.   HENT: Positive for sneezing.   Eyes: Negative.   Respiratory: Positive for cough, shortness of breath and wheezing.   Cardiovascular: Positive for palpitations.  Gastrointestinal: Negative.   Endocrine:  Negative.   Genitourinary: Positive for frequency.  Musculoskeletal: Negative.   Skin: Negative.   Allergic/Immunologic: Positive for environmental allergies and food allergies.  Neurological: Positive for dizziness and light-headedness.  Hematological: Bruises/bleeds easily.  Psychiatric/Behavioral: The patient is nervous/anxious.    Medications:   Allergies as of 04/21/2019      Reactions   Codeine Nausea And Vomiting   Asa [aspirin] Other (See Comments)   wheezing      Medication List       Accurate as of April 21, 2019  9:59 AM. If you have any questions, ask your nurse or doctor.        albuterol 108 (90 Base) MCG/ACT inhaler Commonly known as: VENTOLIN HFA Inhale 1-2 puffs into the lungs every 4 (four) hours as needed for wheezing or shortness of breath.   budesonide-formoterol 160-4.5 MCG/ACT inhaler Commonly known as: Symbicort INHALE 2 PUFFS INTO THE LUNGS EVERY MORNING   clobetasol cream 0.05 % Commonly known as: TEMOVATE Apply 1 application topically every morning.   lisinopril 30 MG tablet Commonly known as: ZESTRIL Take 1 tablet (30 mg total) by mouth at bedtime.   loratadine 10 MG tablet Commonly known as: CLARITIN Take 10 mg by mouth daily as needed for allergies.   montelukast 10 MG tablet Commonly known as: SINGULAIR Take 1 tablet (10 mg total) by mouth at bedtime.   omeprazole 40 MG capsule Commonly known as: PRILOSEC Take 1 capsule (40 mg total) by mouth 2 (two) times daily.   sertraline 50 MG tablet Commonly known as: ZOLOFT Take 1 tablet (50 mg total) by mouth at bedtime.   triamcinolone 55 MCG/ACT Aero nasal inhaler Commonly known as: Nasacort Allergy 24HR Place 1 spray into the nose daily as needed (congestion/allergies.).       Past Surgical History:  She  has a past surgical history that includes Tubal ligation; Shoulder arthroscopy with subacromial decompression (Left, 11/18/2014); Knee surgery; and Cervix lesion destruction  (1990).  Family History:  Her family history includes ADD / ADHD in her son; Alcohol abuse in her mother; Asthma in her father; Bipolar disorder in her mother; Breast cancer in her maternal grandmother and paternal grandmother; Diabetes in her father and mother; Healthy in her brother and son; Heart disease in her father and mother; Hypertension in her father and mother; Lung cancer in her mother; Stroke in her maternal grandmother.  Social History:  She  reports that she quit smoking about 5 years ago. Her smoking use included cigarettes. She has a 45.00 pack-year smoking history. She has never used smokeless tobacco. She reports current alcohol use. She reports that she does not use drugs.

## 2019-04-21 NOTE — Progress Notes (Signed)
  Subjective:     Patient ID: Teresa Dunn, female   DOB: Dec 03, 1965, 53 y.o.   MRN: [id number]  HPI   Review of Systems  Constitutional: Negative.   HENT: Positive for sneezing.   Eyes: Negative.   Respiratory: Positive for cough, shortness of breath and wheezing.   Cardiovascular: Positive for palpitations.  Gastrointestinal: Negative.   Endocrine: Negative.   Genitourinary: Positive for frequency.  Musculoskeletal: Negative.   Skin: Negative.   Allergic/Immunologic: Positive for environmental allergies and food allergies.  Neurological: Positive for dizziness and light-headedness.  Hematological: Bruises/bleeds easily.  Psychiatric/Behavioral: The patient is nervous/anxious.        Objective:   Physical Exam     Assessment:         Plan:

## 2019-04-30 LAB — COLOGUARD: Cologuard: NEGATIVE

## 2019-05-11 ENCOUNTER — Other Ambulatory Visit: Payer: Self-pay | Admitting: Family Medicine

## 2019-05-11 MED ORDER — MONTELUKAST SODIUM 10 MG PO TABS: 10.0000 mg | ORAL_TABLET | Freq: Every day | ORAL | 11 refills | Status: DC

## 2019-05-12 ENCOUNTER — Other Ambulatory Visit: Payer: Self-pay

## 2019-05-12 MED ORDER — FLUTICASONE-SALMETEROL 100-50 MCG/DOSE IN AEPB: 1.0000 | INHALATION_SPRAY | Freq: Two times a day (BID) | RESPIRATORY_TRACT | 3 refills | Status: DC

## 2019-05-13 ENCOUNTER — Encounter: Payer: Self-pay | Admitting: Family Medicine

## 2019-06-15 ENCOUNTER — Other Ambulatory Visit: Payer: Self-pay

## 2019-06-15 ENCOUNTER — Ambulatory Visit: Payer: BC Managed Care – PPO

## 2019-06-15 DIAGNOSIS — R0683 Snoring: Secondary | ICD-10-CM

## 2019-06-15 DIAGNOSIS — G4733 Obstructive sleep apnea (adult) (pediatric): Secondary | ICD-10-CM

## 2019-06-23 ENCOUNTER — Telehealth: Payer: Self-pay | Admitting: Pulmonary Disease

## 2019-06-23 DIAGNOSIS — G4733 Obstructive sleep apnea (adult) (pediatric): Secondary | ICD-10-CM

## 2019-06-23 NOTE — Telephone Encounter (Signed)
LVMTCB x 1 for patient. 

## 2019-06-23 NOTE — Telephone Encounter (Signed)
HST 06/15/19 >> AHI 5.8, SpO2 low 80%   Please inform her that her sleep study shows mild obstructive sleep apnea.  Please arrange for ROV with me or NP to discuss treatment options.

## 2019-06-24 NOTE — Telephone Encounter (Signed)
Spoke with Teresa Dunn. She is aware of results. Teresa Dunn has been scheduled to do a video visit with Beth tomorrow at 0900. Nothing further was needed.

## 2019-06-25 ENCOUNTER — Telehealth (INDEPENDENT_AMBULATORY_CARE_PROVIDER_SITE_OTHER): Payer: BC Managed Care – PPO | Admitting: Primary Care

## 2019-06-25 DIAGNOSIS — G4733 Obstructive sleep apnea (adult) (pediatric): Secondary | ICD-10-CM | POA: Diagnosis not present

## 2019-06-25 NOTE — Patient Instructions (Signed)
Home sleep test on 06/15/19 showed mild obstructive sleep apnea  Sleep apnea: - Aim to wear CPAP every night for 4-6 hours or more - Do not drive if experiencing excessive daytime fatigue or somnolence  - Continue to work on weight loss   Morning Headaches: - Likely from underlying sleep apnea and/or sinusitis, if no improvement with CPAP recommend following up with PCP - Take tylenol as needed, stay well hydrated   Orders: - Refer to DME for new CPAP start; auto titrate 5-15cm h20, mask of choice, supplies and humidification  Follow-up - 2 months office visit or virtual visit with Beth NP or Dr. Halford Chessman with download - Sooner if having any issues with CPAP     Sleep Apnea Sleep apnea affects breathing during sleep. It causes breathing to stop for a short time or to become shallow. It can also increase the risk of:  Heart attack.  Stroke.  Being very overweight (obese).  Diabetes.  Heart failure.  Irregular heartbeat. The goal of treatment is to help you breathe normally again. What are the causes? There are three kinds of sleep apnea:  Obstructive sleep apnea. This is caused by a blocked or collapsed airway.  Central sleep apnea. This happens when the brain does not send the right signals to the muscles that control breathing.  Mixed sleep apnea. This is a combination of obstructive and central sleep apnea. The most common cause of this condition is a collapsed or blocked airway. This can happen if:  Your throat muscles are too relaxed.  Your tongue and tonsils are too large.  You are overweight.  Your airway is too small. What increases the risk?  Being overweight.  Smoking.  Having a small airway.  Being older.  Being female.  Drinking alcohol.  Taking medicines to calm yourself (sedatives or tranquilizers).  Having family members with the condition. What are the signs or symptoms?  Trouble staying asleep.  Being sleepy or tired during the  day.  Getting angry a lot.  Loud snoring.  Headaches in the morning.  Not being able to focus your mind (concentrate).  Forgetting things.  Less interest in sex.  Mood swings.  Personality changes.  Feelings of sadness (depression).  Waking up a lot during the night to pee (urinate).  Dry mouth.  Sore throat. How is this diagnosed?  Your medical history.  A physical exam.  A test that is done when you are sleeping (sleep study). The test is most often done in a sleep lab but may also be done at home. How is this treated?   Sleeping on your side.  Using a medicine to get rid of mucus in your nose (decongestant).  Avoiding the use of alcohol, medicines to help you relax, or certain pain medicines (narcotics).  Losing weight, if needed.  Changing your diet.  Not smoking.  Using a machine to open your airway while you sleep, such as: ? An oral appliance. This is a mouthpiece that shifts your lower jaw forward. ? A CPAP device. This device blows air through a mask when you breathe out (exhale). ? An EPAP device. This has valves that you put in each nostril. ? A BPAP device. This device blows air through a mask when you breathe in (inhale) and breathe out.  Having surgery if other treatments do not work. It is important to get treatment for sleep apnea. Without treatment, it can lead to:  High blood pressure.  Coronary artery disease.  In  men, not being able to have an erection (impotence).  Reduced thinking ability. Follow these instructions at home: Lifestyle  Make changes that your doctor recommends.  Eat a healthy diet.  Lose weight if needed.  Avoid alcohol, medicines to help you relax, and some pain medicines.  Do not use any products that contain nicotine or tobacco, such as cigarettes, e-cigarettes, and chewing tobacco. If you need help quitting, ask your doctor. General instructions  Take over-the-counter and prescription medicines only  as told by your doctor.  If you were given a machine to use while you sleep, use it only as told by your doctor.  If you are having surgery, make sure to tell your doctor you have sleep apnea. You may need to bring your device with you.  Keep all follow-up visits as told by your doctor. This is important. Contact a doctor if:  The machine that you were given to use during sleep bothers you or does not seem to be working.  You do not get better.  You get worse. Get help right away if:  Your chest hurts.  You have trouble breathing in enough air.  You have an uncomfortable feeling in your back, arms, or stomach.  You have trouble talking.  One side of your body feels weak.  A part of your face is hanging down. These symptoms may be an emergency. Do not wait to see if the symptoms will go away. Get medical help right away. Call your local emergency services (911 in the [place] Do not drive yourself to the hospital. Summary  This condition affects breathing during sleep.  The most common cause is a collapsed or blocked airway.  The goal of treatment is to help you breathe normally while you sleep. This information is not intended to replace advice given to you by your health care provider. Make sure you discuss any questions you have with your health care provider. Document Revised: 03/21/2018 Document Reviewed: 01/28/2018 Elsevier Patient Education  [place].    CPAP and BPAP Information CPAP and BPAP are methods of helping a person breathe with the use of air pressure. CPAP stands for "continuous positive airway pressure." BPAP stands for "bi-level positive airway pressure." In both methods, air is blown through your nose or mouth and into your air passages to help you breathe well. CPAP and BPAP use different amounts of pressure to blow air. With CPAP, the amount of pressure stays the same while you breathe in and out. With BPAP, the amount of pressure is increased  when you breathe in (inhale) so that you can take larger breaths. Your health care provider will recommend whether CPAP or BPAP would be more helpful for you. Why are CPAP and BPAP treatments used? CPAP or BPAP can be helpful if you have:  Sleep apnea.  Chronic obstructive pulmonary disease (COPD).  Heart failure.  Medical conditions that weaken the muscles of the chest including muscular dystrophy, or neurological diseases such as amyotrophic lateral sclerosis (ALS).  Other problems that cause breathing to be weak, abnormal, or difficult. CPAP is most commonly used for obstructive sleep apnea (OSA) to keep the airways from collapsing when the muscles relax during sleep. How is CPAP or BPAP administered? Both CPAP and BPAP are provided by a small machine with a flexible plastic tube that attaches to a plastic mask. You wear the mask. Air is blown through the mask into your nose or mouth. The amount of pressure that is used to  blow the air can be adjusted on the machine. Your health care provider will determine the pressure setting that should be used based on your individual needs. When should CPAP or BPAP be used? In most cases, the mask only needs to be worn during sleep. Generally, the mask needs to be worn throughout the night and during any daytime naps. People with certain medical conditions may also need to wear the mask at other times when they are awake. Follow instructions from your health care provider about when to use the machine. What are some tips for using the mask?   Because the mask needs to be snug, some people feel trapped or closed-in (claustrophobic) when first using the mask. If you feel this way, you may need to get used to the mask. One way to do this is by holding the mask loosely over your nose or mouth and then gradually applying the mask more snugly. You can also gradually increase the amount of time that you use the mask.  Masks are available in various types and  sizes. Some fit over your mouth and nose while others fit over just your nose. If your mask does not fit well, talk with your health care provider about getting a different one.  If you are using a mask that fits over your nose and you tend to breathe through your mouth, a chin strap may be applied to help keep your mouth closed.  The CPAP and BPAP machines have alarms that may sound if the mask comes off or develops a leak.  If you have trouble with the mask, it is very important that you talk with your health care provider about finding a way to make the mask easier to tolerate. Do not stop using the mask. Stopping the use of the mask could have a negative impact on your health. What are some tips for using the machine?  Place your CPAP or BPAP machine on a secure table or stand near an electrical outlet.  Know where the on/off switch is located on the machine.  Follow instructions from your health care provider about how to set the pressure on your machine and when you should use it.  Do not eat or drink while the CPAP or BPAP machine is on. Food or fluids could get pushed into your lungs by the pressure of the CPAP or BPAP.  Do not smoke. Tobacco smoke residue can damage the machine.  For home use, CPAP and BPAP machines can be rented or purchased through home health care companies. Many different brands of machines are available. Renting a machine before purchasing may help you find out which particular machine works well for you.  Keep the CPAP or BPAP machine and attachments clean. Ask your health care provider for specific instructions. Get help right away if:  You have redness or open areas around your nose or mouth where the mask fits.  You have trouble using the CPAP or BPAP machine.  You cannot tolerate wearing the CPAP or BPAP mask.  You have pain, discomfort, and bloating in your abdomen. Summary  CPAP and BPAP are methods of helping a person breathe with the use of air  pressure.  Both CPAP and BPAP are provided by a small machine with a flexible plastic tube that attaches to a plastic mask.  If you have trouble with the mask, it is very important that you talk with your health care provider about finding a way to make the mask  easier to tolerate. This information is not intended to replace advice given to you by your health care provider. Make sure you discuss any questions you have with your health care provider. Document Revised: 09/24/2018 Document Reviewed: 04/23/2016 Elsevier Patient Education  [place].

## 2019-06-25 NOTE — Progress Notes (Signed)
Virtual Visit via Video Note  I connected with Teresa Dunn on 06/25/19 at  9:00 AM EST by a video enabled telemedicine application and verified that I am speaking with the correct person using two identifiers.  Location: Patient: Home Provider: [facility]   I discussed the limitations of evaluation and management by telemedicine and the availability of in person appointments. The patient expressed understanding and agreed to proceed.  History of Present Illness: 54 year old female, former smoker. PMH significant for moderate persistent asthma, allergic rhinitis, HTN, GERD, obesity, anxiety/depression disorder. Patient of Dr. Halford Chessman, seen for initial sleep consult on 04/21/19 for snoring. HST 06/15/19 >> AHI 5.8, SpO2 low 80%. [facility] 10/24.   06/25/2019 Patient contacted today for video visit to review home sleep test results. HST showed mild OSA. Reports feeling tired during the day and has to nap on weekends. She has been experiencing daily headaches in the morning. She is compliant with nasal sprays. Discussed treatment options including weight loss, side sleeping position, CPAP, oral appliances and ENT referral/surgical options.    Observations/Objective:  - Appears well; able to speak in full sentences - No observed shortness of breath, wheezing or cough  Assessment and Plan:  Mild OSA: - HST on 06/15/19 showed mild obstructive sleep apnea; AHI 5.8  - Discussed treatment options including weight loss, side sleeping position, CPAP, oral appliances and ENT referral/surgical options. She would like to go ahead and trial CPAP therapy.  - Refer to DME for new CPAP start; auto titrate 5-15cm h20, mask of choice, supplies and humidification - Educated patient to aim to wear CPAP every night for 4-6 hours or more - Advised not to drive if experiencing excessive daytime fatigue - Continue to work on weight loss   Asthma:  - Stable on current therapy - Continues Symbicort 160 2bid;  prn albuterol hfa   Allergic rhinitis: - Continue Claritin, Singulair and Nasacort nasal spray   Headache: - Daily morning headaches, likely from sinusitis and/or sleep apnea - Continue allergy medication and nasal spray - Tyelnol 650 q 6 hours prn headache  - If no improvement with CPAP then recommend follow-up up with PCP   Follow Up Instructions:   - FU in 2 months with download or sooner if needed   I discussed the assessment and treatment plan with the patient. The patient was provided an opportunity to ask questions and all were answered. The patient agreed with the plan and demonstrated an understanding of the instructions.   The patient was advised to call back or seek an in-person evaluation if the symptoms worsen or if the condition fails to improve as anticipated.  I provided 18 minutes of non-face-to-face time during this encounter.   Martyn Ehrich, NP

## 2019-06-25 NOTE — Progress Notes (Signed)
Reviewed and agree with assessment/plan.   Armando Lauman, MD [facility] 06/13/2016, 12:24 PM Pager:  [phone]  

## 2019-07-15 ENCOUNTER — Encounter: Payer: BC Managed Care – PPO | Admitting: Family Medicine

## 2019-07-22 ENCOUNTER — Telehealth: Payer: Self-pay | Admitting: General Surgery

## 2019-07-22 NOTE — Telephone Encounter (Signed)
Fax dated 07/17/19 received from [facility] stating the patient declined the CPAP machine after having been advised of the financial requirement from machine. The order for the CPAP has been voided. Nothing further needed at this time.

## 2019-08-22 ENCOUNTER — Ambulatory Visit: Payer: Self-pay

## 2019-08-22 ENCOUNTER — Ambulatory Visit: Payer: BC Managed Care – PPO | Attending: Internal Medicine

## 2019-08-22 DIAGNOSIS — Z23 Encounter for immunization: Secondary | ICD-10-CM | POA: Insufficient documentation

## 2019-08-22 NOTE — Progress Notes (Signed)
   Covid-19 Vaccination Clinic  Name:  SHREEYA GAVITT    MRN: [id number] DOB: [birth year]  08/22/2019  Ms. Etchison was observed post Covid-19 immunization for 15 minutes without incident. She was provided with Vaccine Information Sheet and instruction to access the V-Safe system.   Ms. Briggs was instructed to call 911 with any severe reactions post vaccine: Marland Kitchen Difficulty breathing  . Swelling of face and throat  . A fast heartbeat  . A bad rash all over body  . Dizziness and weakness   Immunizations Administered    Name Date Dose VIS Date Route   Pfizer COVID-19 Vaccine 08/22/2019  9:37 AM 0.3 mL 05/29/2019 Intramuscular   Manufacturer: [place]   Lot: [id number]   [place]: [id number]

## 2019-08-24 ENCOUNTER — Ambulatory Visit: Payer: BC Managed Care – PPO | Admitting: Primary Care

## 2019-09-02 ENCOUNTER — Other Ambulatory Visit: Payer: Self-pay

## 2019-09-02 ENCOUNTER — Encounter: Payer: Self-pay | Admitting: Family Medicine

## 2019-09-02 ENCOUNTER — Other Ambulatory Visit: Payer: Self-pay | Admitting: Family Medicine

## 2019-09-02 ENCOUNTER — Ambulatory Visit (INDEPENDENT_AMBULATORY_CARE_PROVIDER_SITE_OTHER): Payer: BC Managed Care – PPO | Admitting: Family Medicine

## 2019-09-02 VITALS — BP 118/84 | HR 75 | Temp 97.7°F | Ht 67.0 in | Wt 248.0 lb

## 2019-09-02 DIAGNOSIS — E661 Drug-induced obesity: Secondary | ICD-10-CM | POA: Diagnosis not present

## 2019-09-02 DIAGNOSIS — R7301 Impaired fasting glucose: Secondary | ICD-10-CM | POA: Diagnosis not present

## 2019-09-02 DIAGNOSIS — Z1231 Encounter for screening mammogram for malignant neoplasm of breast: Secondary | ICD-10-CM

## 2019-09-02 DIAGNOSIS — Z0000 Encounter for general adult medical examination without abnormal findings: Secondary | ICD-10-CM

## 2019-09-02 DIAGNOSIS — M1711 Unilateral primary osteoarthritis, right knee: Secondary | ICD-10-CM

## 2019-09-02 DIAGNOSIS — Z6835 Body mass index (BMI) 35.0-35.9, adult: Secondary | ICD-10-CM

## 2019-09-02 DIAGNOSIS — F411 Generalized anxiety disorder: Secondary | ICD-10-CM | POA: Diagnosis not present

## 2019-09-02 DIAGNOSIS — I10 Essential (primary) hypertension: Secondary | ICD-10-CM | POA: Diagnosis not present

## 2019-09-02 DIAGNOSIS — F339 Major depressive disorder, recurrent, unspecified: Secondary | ICD-10-CM | POA: Diagnosis not present

## 2019-09-02 HISTORY — DX: Unilateral primary osteoarthritis, right knee: M17.11

## 2019-09-02 LAB — COMPREHENSIVE METABOLIC PANEL
ALT: 18 U/L (ref 0–35)
AST: 18 U/L (ref 0–37)
Albumin: 4.1 g/dL (ref 3.5–5.2)
Alkaline Phosphatase: 102 U/L (ref 39–117)
BUN: 22 mg/dL (ref 6–23)
CO2: 25 mEq/L (ref 19–32)
Calcium: 9.5 mg/dL (ref 8.4–10.5)
Chloride: 104 mEq/L (ref 96–112)
Creatinine, Ser: 0.9 mg/dL (ref 0.40–1.20)
GFR: 65.65 mL/min (ref >60.00)
Glucose, Bld: 104 mg/dL — ABNORMAL HIGH (ref 70–99)
Potassium: 4.1 mEq/L (ref 3.5–5.1)
Sodium: 137 mEq/L (ref 135–145)
Total Bilirubin: 0.5 mg/dL (ref 0.2–1.2)
Total Protein: 6.9 g/dL (ref 6.0–8.3)

## 2019-09-02 LAB — CBC WITH DIFFERENTIAL/PLATELET
Basophils Absolute: 0 10*3/uL (ref 0.0–0.1)
Basophils Relative: 0.7 % (ref 0.0–3.0)
Eosinophils Absolute: 0.1 10*3/uL (ref 0.0–0.7)
Eosinophils Relative: 1.7 % (ref 0.0–5.0)
HCT: 37.2 % (ref 36.0–46.0)
Hemoglobin: 12.2 g/dL (ref 12.0–15.0)
Lymphocytes Relative: 21.3 % (ref 12.0–46.0)
Lymphs Abs: 1.3 10*3/uL (ref 0.7–4.0)
MCHC: 32.6 g/dL (ref 30.0–36.0)
MCV: 89.8 fl (ref 78.0–100.0)
Monocytes Absolute: 0.5 10*3/uL (ref 0.1–1.0)
Monocytes Relative: 7.6 % (ref 3.0–12.0)
Neutro Abs: 4.2 10*3/uL (ref 1.4–7.7)
Neutrophils Relative %: 68.7 % (ref 43.0–77.0)
Platelets: 259 10*3/uL (ref 150.0–400.0)
RBC: 4.15 Mil/uL (ref 3.87–5.11)
RDW: 13.7 % (ref 11.5–15.5)
WBC: 6 10*3/uL (ref 4.0–10.5)

## 2019-09-02 LAB — LIPID PANEL
Cholesterol: 193 mg/dL (ref 0–200)
HDL: 74.7 mg/dL
LDL Cholesterol: 101 mg/dL — ABNORMAL HIGH (ref 0–99)
NonHDL: 117.86
Total CHOL/HDL Ratio: 3
Triglycerides: 85 mg/dL (ref 0.0–149.0)
VLDL: 17 mg/dL (ref 0.0–40.0)

## 2019-09-02 LAB — TSH: TSH: 1.44 u[IU]/mL (ref 0.35–4.50)

## 2019-09-02 LAB — HEMOGLOBIN A1C: Hgb A1c MFr Bld: 5.7 % (ref 4.6–6.5)

## 2019-09-02 NOTE — Progress Notes (Signed)
Subjective  Chief Complaint  Patient presents with  . Annual Exam    not fasting  . Anxiety    symptoms are good on medication  . Depression    medications helps with depression  . Hypertension    no HA, dizziness, or visual changes  . Gastroesophageal Reflux    taking prilosec as needed. helps relieve symptoms    HPI: Teresa Dunn is a 54 y.o. female who presents to [facility] today for a Female Wellness Visit. She also has the concerns and/or needs as listed above in the chief complaint. These will be addressed in addition to the Health Maintenance Visit.   Wellness Visit: annual visit with health maintenance review and exam without Pap   HM: still needs mammogram. Pap and CRC screens are normal and up to date Chronic disease f/u and/or acute problem visit: (deemed necessary to be done in addition to the wellness visit):  Htn: Feeling well. Taking medications w/o adverse effects. No symptoms of CHF, angina; no palpitations, sob, cp or lower extremity edema. Compliant with meds.   Asthma and depression and gerd: all on meds and stable. No concerns.   Assessment  1. Annual physical exam   2. IFG (impaired fasting glucose)   3. Essential hypertension   4. Generalized anxiety disorder   5. Major depression, recurrent, chronic (HCC)   6. Class 2 drug-induced obesity with serious comorbidity and body mass index (BMI) of 35.0 to 35.9 in adult      Plan  Female Wellness Visit:  Age appropriate Health Maintenance and Prevention measures were discussed with patient. Included topics are cancer screening recommendations, ways to keep healthy (see AVS) including dietary and exercise recommendations, regular eye and dental care, use of seat belts, and avoidance of moderate alcohol use and tobacco use. rec pt make an appt for mammogram  BMI: discussed patient's BMI and encouraged positive lifestyle modifications to help get to or maintain a target  BMI.  HM needs and immunizations were addressed and ordered. See below for orders. See HM and immunization section for updates.  Routine labs and screening tests ordered including cmp, cbc and lipids where appropriate.  Discussed recommendations regarding Vit D and calcium supplementation (see AVS)  Chronic disease management visit and/or acute problem visit:  HTN controlled. Check labs  Ifg: recheck. No sxs of hyperglycemia.   Asthma, depression/anxety: continue current meds.   GERD on high dose ppi. Follow up: Return in about 6 months (around 03/04/2020) for follow up Hypertension.  Orders Placed This Encounter  Procedures  . CBC with Differential/Platelet  . Comprehensive metabolic panel  . Lipid panel  . TSH  . Hemoglobin A1c   No orders of the defined types were placed in this encounter.     Lifestyle: There is no height or weight on file to calculate BMI. Wt Readings from Last 3 Encounters:  04/21/19 241 lb 6.4 oz (109.5 kg)  02/11/19 245 lb 3.2 oz (111.2 kg)  07/07/18 230 lb (104.3 kg)     Patient Active Problem List   Diagnosis Date Noted  . IFG (impaired fasting glucose) 05/19/2018    Priority: High  . Class 2 drug-induced obesity with serious comorbidity and body mass index (BMI) of 35.0 to 35.9 in adult 03/27/2018    Priority: High  . Essential hypertension 02/25/2018    Priority: High  . Former heavy tobacco smoker 02/25/2018    Priority: High    1.5 ppd x  30 years. Quit 2016   . Generalized anxiety disorder 10/10/2010    Priority: High    cymbalta - weight gain and hot flushes wellbutrin failed, started zoloft January 2020.    . Moderate persistent asthma 02/25/2018    Priority: Medium  . Chondromalacia, left knee 04/18/2017    Priority: Medium  . Chronic mixed headache syndrome 08/07/2012    Priority: Medium  . GERD (gastroesophageal reflux disease) 06/26/2012    Priority: Medium  . Major depression, recurrent, chronic ([place]) 10/23/2010     Priority: Medium  . Lipoma of abdominal wall 02/25/2018    Priority: Low  . Plantar fasciitis of left foot 11/30/2016    Priority: Low  . Tear of medial meniscus of left knee 11/30/2016    Priority: Low  . Perennial allergic rhinitis 08/07/2012    Priority: Low   Health Maintenance  Topic Date Due  . MAMMOGRAM  11/19/2010  . TETANUS/TDAP  06/26/2021  . Fecal DNA (Cologuard)  04/29/2022  . PAP SMEAR-Modifier  03/28/2023  . INFLUENZA VACCINE  Completed  . HIV Screening  Completed   Immunization History  Administered Date(s) Administered  . Influenza Split 03/26/2012  . Influenza,inj,Quad PF,6+ Mos 02/25/2018, 02/11/2019  . Influenza,trivalent, recombinat, inj, PF 03/29/2011  . PFIZER SARS-COV-2 Vaccination 08/22/2019  . Pneumococcal Conjugate-13 06/27/2007  . Tdap 06/27/2011   We updated and reviewed the patient's past history in detail and it is documented below. Allergies: Patient is allergic to codeine and asa [aspirin]. Past Medical History Patient  has a past medical history of Anxiety, Arthritis, Asthma, Cervical dysplasia (1990), Depression, Former heavy tobacco smoker (02/25/2018), Genital warts, GERD (gastroesophageal reflux disease), and Hypertension. Past Surgical History Patient  has a past surgical history that includes Tubal ligation; Shoulder arthroscopy with subacromial decompression (Left, 11/18/2014); Knee surgery; and Cervix lesion destruction (1990). Family History: Patient family history includes ADD / ADHD in her son; Alcohol abuse in her mother; Asthma in her father; Bipolar disorder in her mother; Breast cancer in her maternal grandmother and paternal grandmother; Diabetes in her father and mother; Healthy in her brother and son; Heart disease in her father and mother; Hypertension in her father and mother; Lung cancer in her mother; Stroke in her maternal grandmother. Social History:  Patient  reports that she quit smoking about 5 years ago. Her smoking use  included cigarettes. She has a 45.00 pack-year smoking history. She has never used smokeless tobacco. She reports current alcohol use. She reports that she does not use drugs.  Review of Systems: Constitutional: negative for fever or malaise Ophthalmic: negative for photophobia, double vision or loss of vision Cardiovascular: negative for chest pain, dyspnea on exertion, or new LE swelling Respiratory: negative for SOB or persistent cough Gastrointestinal: negative for abdominal pain, change in bowel habits or melena Genitourinary: negative for dysuria or gross hematuria, no abnormal uterine bleeding or disharge Musculoskeletal: negative for new gait disturbance or muscular weakness Integumentary: negative for new or persistent rashes, no breast lumps Neurological: negative for TIA or stroke symptoms Psychiatric: negative for SI or delusions Allergic/Immunologic: negative for hives  Patient Care Team    Relationship Specialty Notifications Start End  Leamon Arnt, MD PCP - General Family Medicine  02/25/18     Objective  Vitals: There were no vitals taken for this visit. General:  Well developed, well nourished, no acute distress  Psych:  Alert and orientedx3,normal mood and affect HEENT:  Normocephalic, atraumatic, non-icteric sclera,  supple neck without adenopathy, mass or  thyromegaly Cardiovascular:  Normal S1, S2, RRR without gallop, rub or murmur Respiratory:  Good breath sounds bilaterally, CTAB with normal respiratory effort Gastrointestinal: normal bowel sounds, soft, non-tender, no noted masses. No HSM MSK: no deformities, contusions. Joints are without erythema or swelling.  Skin:  Warm, no rashes or suspicious lesions noted Neurologic:    Mental status is normal. CN 2-11 are normal. Gross motor and sensory exams are normal. Normal gait. No tremor Breast Exam: No mass, skin retraction or nipple discharge is appreciated in either breast. No axillary adenopathy. Fibrocystic  changes are not noted    Commons side effects, risks, benefits, and alternatives for medications and treatment plan prescribed today were discussed, and the patient expressed understanding of the given instructions. Patient is instructed to call or message via MyChart if he/she has any questions or concerns regarding our treatment plan. No barriers to understanding were identified. We discussed Red Flag symptoms and signs in detail. Patient expressed understanding regarding what to do in case of urgent or emergency type symptoms.   Medication list was reconciled, printed and provided to the patient in AVS. Patient instructions and summary information was reviewed with the patient as documented in the AVS. This note was prepared with assistance of Dragon voice recognition software. Occasional wrong-word or sound-a-like substitutions may have occurred due to the inherent limitations of voice recognition software  This visit occurred during the SARS-CoV-2 public health emergency.  Safety protocols were in place, including screening questions prior to the visit, additional usage of staff PPE, and extensive cleaning of exam room while observing appropriate contact time as indicated for disinfecting solutions.

## 2019-09-02 NOTE — Patient Instructions (Addendum)
Please return in 6 months for hypertension follow up.  I will release your lab results to you on your MyChart account with further instructions. Please reply with any questions.   If you have any questions or concerns, please don't hesitate to send me a message via MyChart or call the office at [phone]. Thank you for visiting with [place] today! It's our pleasure caring for you.  I have ordered a mammogram and/or bone density for you as we discussed today: [x]   Mammogram  []   Bone Density  Please call the office checked below to schedule your appointment: Your appointment will at the following location  [x]   [facility] [place]      [place], [place]         []   [facility]  [place], [place]  Make sure to wear two peace clothing  No lotions powders or deodorants the day of the appointment Make sure to bring picture ID and insurance card.  Bring list of medications you are currently taking including any supplements.   Please do these things to maintain good health!   Exercise at least 30-45 minutes a day,  4-5 days a week.   Eat a low-fat diet with lots of fruits and vegetables, up to 7-9 servings per day.  Drink plenty of water daily. Try to drink 8 8oz glasses per day.  Seatbelts can save your life. Always wear your seatbelt.  Place Smoke Detectors on every level of your home and check batteries every year.  Schedule an appointment with an eye doctor for an eye exam every 1-2 years  Safe sex - use condoms to protect yourself from STDs if you could be exposed to these types of infections. Use birth control if you do not want to become pregnant and are sexually active.  Avoid heavy alcohol use. If you drink, keep it to less than 2 drinks/day and not every day.  [place].  Choose someone you trust that could speak for you if you became unable to speak  for yourself.  Depression is common in our stressful world.If you're feeling down or losing interest in things you normally enjoy, please come in for a visit.  If anyone is threatening or hurting you, please get help. Physical or Emotional Violence is never OK.

## 2019-09-12 ENCOUNTER — Ambulatory Visit: Payer: BC Managed Care – PPO | Attending: Internal Medicine

## 2019-09-12 DIAGNOSIS — Z23 Encounter for immunization: Secondary | ICD-10-CM

## 2019-09-12 NOTE — Progress Notes (Signed)
   Covid-19 Vaccination Clinic  Name:  TALAH SORSBY    MRN: [id number] DOB: 11-28-1965  09/12/2019  Ms. Casella was observed post Covid-19 immunization for 15 minutes without incident. She was provided with Vaccine Information Sheet and instruction to access the V-Safe system.   Ms. Rudloff was instructed to call 911 with any severe reactions post vaccine: Marland Kitchen Difficulty breathing  . Swelling of face and throat  . A fast heartbeat  . A bad rash all over body  . Dizziness and weakness   Immunizations Administered    Name Date Dose VIS Date Route   Pfizer COVID-19 Vaccine 09/12/2019  8:23 AM 0.3 mL 05/29/2019 Intramuscular   Manufacturer: Arthur   Lot: [id number]   [place]: [id number]

## 2019-10-26 ENCOUNTER — Other Ambulatory Visit: Payer: Self-pay

## 2019-10-26 ENCOUNTER — Ambulatory Visit
Admission: RE | Admit: 2019-10-26 | Discharge: 2019-10-26 | Disposition: A | Discharge status: Home / Self Care | Payer: BC Managed Care – PPO | Source: Ambulatory Visit | Attending: Family Medicine | Admitting: Family Medicine

## 2019-10-26 DIAGNOSIS — Z1231 Encounter for screening mammogram for malignant neoplasm of breast: Secondary | ICD-10-CM

## 2019-10-28 ENCOUNTER — Other Ambulatory Visit: Payer: Self-pay | Admitting: Family Medicine

## 2019-10-28 DIAGNOSIS — R928 Other abnormal and inconclusive findings on diagnostic imaging of breast: Secondary | ICD-10-CM

## 2019-11-04 ENCOUNTER — Ambulatory Visit
Admission: RE | Admit: 2019-11-04 | Discharge: 2019-11-04 | Disposition: A | Discharge status: Home / Self Care | Payer: BC Managed Care – PPO | Source: Ambulatory Visit | Attending: Family Medicine | Admitting: Family Medicine

## 2019-11-04 ENCOUNTER — Ambulatory Visit: Admission: RE | Admit: 2019-11-04 | Payer: BC Managed Care – PPO | Source: Ambulatory Visit

## 2019-11-04 ENCOUNTER — Other Ambulatory Visit: Payer: Self-pay

## 2019-11-04 ENCOUNTER — Other Ambulatory Visit: Payer: Self-pay | Admitting: Family Medicine

## 2019-11-04 DIAGNOSIS — N6489 Other specified disorders of breast: Secondary | ICD-10-CM

## 2019-11-04 DIAGNOSIS — R921 Mammographic calcification found on diagnostic imaging of breast: Secondary | ICD-10-CM

## 2019-11-04 DIAGNOSIS — R928 Other abnormal and inconclusive findings on diagnostic imaging of breast: Secondary | ICD-10-CM

## 2019-11-04 DIAGNOSIS — N6310 Unspecified lump in the right breast, unspecified quadrant: Secondary | ICD-10-CM

## 2019-11-09 ENCOUNTER — Ambulatory Visit
Admission: RE | Admit: 2019-11-09 | Discharge: 2019-11-09 | Disposition: A | Discharge status: Home / Self Care | Payer: BC Managed Care – PPO | Source: Ambulatory Visit | Attending: Family Medicine | Admitting: Family Medicine

## 2019-11-09 ENCOUNTER — Other Ambulatory Visit: Payer: Self-pay

## 2019-11-09 ENCOUNTER — Other Ambulatory Visit: Payer: Self-pay | Admitting: Family Medicine

## 2019-11-09 DIAGNOSIS — N6489 Other specified disorders of breast: Secondary | ICD-10-CM

## 2019-11-09 DIAGNOSIS — N6310 Unspecified lump in the right breast, unspecified quadrant: Secondary | ICD-10-CM

## 2019-11-09 HISTORY — PX: BREAST BIOPSY: SHX20

## 2019-11-11 ENCOUNTER — Other Ambulatory Visit: Payer: Self-pay | Admitting: Diagnostic Radiology

## 2019-11-11 ENCOUNTER — Other Ambulatory Visit: Payer: Self-pay

## 2019-11-11 ENCOUNTER — Ambulatory Visit
Admission: RE | Admit: 2019-11-11 | Discharge: 2019-11-11 | Disposition: A | Discharge status: Home / Self Care | Payer: BC Managed Care – PPO | Source: Ambulatory Visit | Attending: Family Medicine | Admitting: Family Medicine

## 2019-11-11 ENCOUNTER — Other Ambulatory Visit: Payer: Self-pay | Admitting: Family Medicine

## 2019-11-11 DIAGNOSIS — R921 Mammographic calcification found on diagnostic imaging of breast: Secondary | ICD-10-CM

## 2019-11-11 HISTORY — PX: BREAST BIOPSY: SHX20

## 2019-11-13 ENCOUNTER — Ambulatory Visit: Payer: BC Managed Care – PPO | Admitting: Family Medicine

## 2019-11-17 ENCOUNTER — Other Ambulatory Visit: Payer: Self-pay | Admitting: Surgery

## 2019-11-17 ENCOUNTER — Other Ambulatory Visit: Payer: BC Managed Care – PPO

## 2019-11-17 ENCOUNTER — Ambulatory Visit: Payer: Self-pay | Admitting: Surgery

## 2019-11-17 DIAGNOSIS — C50911 Malignant neoplasm of unspecified site of right female breast: Secondary | ICD-10-CM

## 2019-11-17 NOTE — H&P (View-Only) (Signed)
Kandis Nab Appointment: 11/17/2019 9:20 AM Location: [facility] Patient #: [id number] DOB: 11/06/1965 Married / Language: Cleophus Molt / Race: White Female  History of Present Illness Marcello Moores A. Polette Nofsinger MD; 11/17/2019 1:41 PM) Patient words: Patient presents for evaluation of her right breast cancer. She recently underwent screening mammography. An area of distortion was noted right breast upper quadrant measuring 8 mm in maximal diameter. On the left breast calcifications noted. 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 Left breast showed radial scar. Patient denies history of breast pain, nipple discharge or change in the appearance of either breast. She had 2 grandmothers with breast cancer in their 30s and 70s.    The patient was called back for right breast distortion and left breast calcifications. EXAM: DIGITAL DIAGNOSTIC BILATERAL MAMMOGRAM WITH TOMO ULTRASOUND RIGHT BREAST COMPARISON: Previous exam(s). ACR Breast Density Category b: There are scattered areas of fibroglandular density. FINDINGS: There is a mass with associated distortion in the upper outer right breast. There is a group of calcifications in the inferior central left breast which are new compared to 2011. On physical exam, no suspicious lumps are identified. Targeted ultrasound is performed, showing a mass with associated distortion in the right breast at 11:30, 2 cm from the nipple measuring 8 x 6 by 9 mm. No axillary adenopathy. IMPRESSION: Highly suspicious mass in the right breast with associated distortion. Indeterminate calcifications in the inferior central left breast, new since 2011. RECOMMENDATION: Recommend ultrasound-guided biopsy of the right breast mass. Recommend stereotactic biopsy of the new left breast calcifications. I have discussed the findings and recommendations with the patient. If applicable, a reminder letter will be sent  to the patient regarding the next appointment. BI-RADS CATEGORY 5: Highly suggestive of malignancy. Electronically Signed By: Dorise Bullion III M.D On: 11/04/2019 16:03  Result History  MM DIAG BREAST TOMO BILATERAL (Order #[id number]) on 11/04/2019 - Order Result History Report <[email or web address]>  US BREAST LTD UNI RIGHT INC AXILLA (Order [id number]) - Reflex for Order [id number] Study Result  CLINICAL DATA: The patient was called back for right breast distortion and left breast calcifications. EXAM: DIGITAL DIAGNOSTIC BILATERAL MAMMOGRAM WITH TOMO ULTRASOUND RIGHT BREAST COMPARISON: Previous exam(s). ACR Breast Density Category b: There are scattered areas of fibroglandular density. FINDINGS: There is a mass with associated distortion in the upper outer right breast. There is a group of calcifications in the inferior central left breast which are new compared to 2011. On physical exam, no suspicious lumps are identified. Targeted ultrasound is performed, showing a mass with associated distortion in the right breast at 11:30, 2 cm from the nipple measuring 8 x 6 by 9 mm. No axillary adenopathy. IMPRESSION: Highly suspicious mass in the right breast with associated distortion. Indeterminate calcifications in the inferior central left breast, new since 2011. RECOMMENDATION: Recommend ultrasound-guided biopsy of the right breast mass. Recommend stereotactic biopsy of the new left breast calcifications. I have discussed the findings and recommendations with the patient. If applicable, a reminder letter will be sent to the patient regarding the next appointment. BI-RADS CATEGORY 5: Highly suggestive of malignancy. Electronically Signed By: Dorise Bullion III M.D On: 11/04/2019 16:03  ADDITIONAL INFORMATION: PROGNOSTIC INDICATORS Results: IMMUNOHISTOCHEMICAL AND MORPHOMETRIC ANALYSIS PERFORMED MANUALLY The tumor cells are NEGATIVE for Her2 (1+). Estrogen  Receptor: 100%, POSITIVE, STRONG STAINING INTENSITY Progesterone Receptor: 90%, POSITIVE, STRONG STAINING INTENSITY Proliferation Marker Ki67: 10% REFERENCE RANGE ESTROGEN RECEPTOR NEGATIVE 0% POSITIVE =>1% REFERENCE RANGE PROGESTERONE RECEPTOR NEGATIVE 0% POSITIVE =>1%  All controls stained appropriately Enid Cutter MD Pathologist, Electronic Signature ( Signed 11/11/2019) FINAL DIAGNOSIS Diagnosis Breast, right, needle core biopsy, 11:30 o'clock - INVASIVE DUCTAL CARCINOMA. SEE NOTE 1 of 2 FINAL for Comacho, Aylanie [place]OD[place] ([id number]) Diagnosis Note Invasive carcinoma measures 1 cm in greatest linear dimension and appears grade 1. A breast prognostic profile (ER, PR, Ki-67 and HER2) is pending and will be reported in an addendum. Dr. Tresa Moore reviewed the case and concurs with the diagnosis. [facility] was notified on 11/10/2019. Jaquita Folds MD Pathologist, Electronic Signature (Case signed 11/10/2019)       Diagnosis Breast, left, needle core biopsy, LIQ - COMPLEX SCLEROSING LESION WITH USUAL DUCTAL HYPERPLASIA AND CALCIFICATIONS. - FIBROCYSTIC CHANGES WITH USUAL DUCTAL HYPERPLASIA AND CALCIFICATIONS. - SEE MICROSCOPIC DESCRIPTION. Microscopic Comment Called to [facility] on 11/12/19. (JDP:ah 11/12/19) Claudette Laws MD Pathologist, Electronic Signature (Case signed 11/12/2019) Specimen Gross and Clinical Information Specimen Comment TIF 0755, CIT < 5 min, calcifications Specimen(s) Obtained: Breast, left, needle core biopsy, LIQ Specimen Clinical.  The patient is a 54 year old female.   Past Surgical History Sharyn Lull R. Brooks, CMA; 11/17/2019 9:59 AM) Breast Biopsy Bilateral. Knee Surgery Left. Shoulder Surgery Left.  Diagnostic Studies History Sharyn Lull R. Brooks, CMA; 11/17/2019 9:59 AM) Colonoscopy within last year Mammogram within last year Pap Smear 1-5 years ago  Allergies Sharyn Lull R. Brooks, CMA;  11/17/2019 10:00 AM) Codeine Phosphate *ANALGESICS - OPIOID* Aspirin *ANALGESICS - NonNarcotic*  Medication History (Michelle R. Brooks, CMA; 11/17/2019 10:01 AM) Symbicort (160-4.5MCG/ACT Aerosol, Inhalation) Active. Montelukast Sodium (10MG Tablet, Oral) Active. Lisinopril (30MG Tablet, Oral) Active. Omeprazole (40MG Capsule DR, Oral) Active. Sertraline HCl (50MG Tablet, Oral) Active. Wixela Inhub (100-50MCG/DOSE Aero Pow Br Act, Inhalation) Active. Medications Reconciled  Social History Sharyn Lull R. Brooks, CMA; 11/17/2019 9:59 AM) Alcohol use Occasional alcohol use. Caffeine use Coffee. No drug use Tobacco use Former smoker.  Family History Sharyn Lull R. Rolena Infante, CMA; 11/17/2019 9:59 AM) Arthritis Mother. Depression Mother. Hypertension Mother.  Pregnancy / Birth History Sharyn Lull R. Brooks, CMA; 11/17/2019 9:59 AM) Age at menarche 56 years. Contraceptive History Oral contraceptives. Gravida 2 Irregular periods Maternal age 85-25 Para 2  Other Problems Sharyn Lull R. Brooks, CMA; 11/17/2019 9:59 AM) Anxiety Disorder Asthma Breast Cancer Cervical Cancer Depression Gastroesophageal Reflux Disease High blood pressure Lump In Breast     Review of Systems [facility] R. Brooks CMA; 11/17/2019 9:59 AM) General Not Present- Appetite Loss, Chills, Fatigue, Fever, Night Sweats, Weight Gain and Weight Loss. Skin Not Present- Change in Wart/Mole, Dryness, Hives, Jaundice, New Lesions, Non-Healing Wounds, Rash and Ulcer. HEENT Not Present- Earache, Hearing Loss, Hoarseness, Nose Bleed, Oral Ulcers, Ringing in the Ears, Seasonal Allergies, Sinus Pain, Sore Throat, Visual Disturbances, Wears glasses/contact lenses and Yellow Eyes. Respiratory Not Present- Bloody sputum, Chronic Cough, Difficulty Breathing, Snoring and Wheezing. Cardiovascular Not Present- Chest Pain, Difficulty Breathing Lying Down, Leg Cramps, Palpitations, Rapid Heart Rate, Shortness of Breath and  Swelling of Extremities. Gastrointestinal Not Present- Abdominal Pain, Bloating, Bloody Stool, Change in Bowel Habits, Chronic diarrhea, Constipation, Difficulty Swallowing, Excessive gas, Gets full quickly at meals, Hemorrhoids, Indigestion, Nausea, Rectal Pain and Vomiting. Female Genitourinary Not Present- Frequency, Nocturia, Painful Urination, Pelvic Pain and Urgency. Musculoskeletal Not Present- Back Pain, Joint Pain, Joint Stiffness, Muscle Pain, Muscle Weakness and Swelling of Extremities. Neurological Not Present- Decreased Memory, Fainting, Headaches, Numbness, Seizures, Tingling, Tremor, Trouble walking and Weakness. Psychiatric Present- Anxiety and Depression. Not Present- Bipolar, Change in Sleep Pattern, Fearful and  Frequent crying. Endocrine Not Present- Cold Intolerance, Excessive Hunger, Hair Changes, Heat Intolerance, Hot flashes and New Diabetes. Hematology Not Present- Blood Thinners, Easy Bruising, Excessive bleeding, Gland problems, HIV and Persistent Infections.  Vitals [employer] R. Brooks CMA; 11/17/2019 9:59 AM) 11/17/2019 9:59 AM Weight: 247.5 lb Height: 67in Body Surface Area: 2.21 m Body Mass Index: 38.76 kg/m  Pulse: 82 (Regular)  BP: 132/88(Sitting, Left Arm, Standard)        Physical Exam (Vaughn Frieze A. Tye Juarez MD; 11/17/2019 1:41 PM)  General Mental Status-Alert. General Appearance-Consistent with stated age. Hydration-Well hydrated. Voice-Normal.  Head and Neck Head-normocephalic, atraumatic with no lesions or palpable masses. Trachea-midline. Thyroid Gland Characteristics - normal size and consistency.  Eye Eyeball - Bilateral-Extraocular movements intact. Sclera/Conjunctiva - Bilateral-No scleral icterus.  Chest and Lung Exam Chest and lung exam reveals -quiet, even and easy respiratory effort with no use of accessory muscles and on auscultation, normal breath sounds, no adventitious sounds and normal vocal  resonance. Inspection Chest Wall - Normal. Back - normal.  Breast Breast - Left-Symmetric, Non Tender, No Biopsy scars, no Dimpling - Left, No Inflammation, No Lumpectomy scars, No Mastectomy scars, No Peau d' Orange. Breast - Right-Symmetric, Non Tender, No Biopsy scars, no Dimpling - Right, No Inflammation, No Lumpectomy scars, No Mastectomy scars, No Peau d' Orange. Breast Lump-No Palpable Breast Mass.  Cardiovascular Cardiovascular examination reveals -normal heart sounds, regular rate and rhythm with no murmurs and normal pedal pulses bilaterally.  Abdomen Inspection Inspection of the abdomen reveals - No Hernias. Skin - Scar - no surgical scars. Palpation/Percussion Palpation and Percussion of the abdomen reveal - Soft, Non Tender, No Rebound tenderness, No Rigidity (guarding) and No hepatosplenomegaly. Auscultation Auscultation of the abdomen reveals - Bowel sounds normal.  Neurologic Neurologic evaluation reveals -alert and oriented x 3 with no impairment of recent or remote memory. Mental Status-Normal.  Musculoskeletal Normal Exam - Left-Upper Extremity Strength Normal and Lower Extremity Strength Normal. Normal Exam - Right-Upper Extremity Strength Normal and Lower Extremity Strength Normal.  Lymphatic Head & Neck  General Head & Neck Lymphatics: Bilateral - Description - Normal. Axillary  General Axillary Region: Bilateral - Description - Normal. Tenderness - Non Tender. Femoral & Inguinal  Generalized Femoral & Inguinal Lymphatics: Bilateral - Description - Normal. Tenderness - Non Tender.    Assessment & Plan (Ladon Vandenberghe A. Moxon Messler MD; 11/17/2019 1:42 PM)  BREAST CANCER, RIGHT (C50.911) Impression: Discussed breast conserving surgery versus mastectomy with reconstruction. Risks and benefits as well as long-term expectations of each discussed. She is opted for a procedure lumpectomy with left breast lumpectomy and right axillary sentinel lymph node  mapping  Risk of lumpectomy include bleeding, infection, seroma, more surgery, use of seed/wire, wound care, cosmetic deformity and the need for other treatments, death , blood clots, death. Pt agrees to proceed. Risk of sentinel lymph node mapping include bleeding, infection, lymphedema, shoulder pain. stiffness, dye allergy. cosmetic deformity , blood clots, death, need for more surgery. Pt agrees to proceed.  Refer to medical and radiation oncology Refer to genetics Total time 45 minutes  Current Plans You are being scheduled for surgery- Our schedulers will call you.  You should hear from our office's scheduling department within 5 working days about the location, date, and time of surgery. We try to make accommodations for patient's preferences in scheduling surgery, but sometimes the OR schedule or the surgeon's schedule prevents [place] from making those accommodations.  If you have not heard from our office [phone]) in 5 working days,  call the office and ask for your surgeon's nurse.  If you have other questions about your diagnosis, plan, or surgery, call the office and ask for your surgeon's nurse.  Pt Education - [facility] Breast Cancer Information Given - [facility] "Breast Journey" Package We discussed the staging and pathophysiology of breast cancer. We discussed all of the different options for treatment for breast cancer including surgery, chemotherapy, radiation therapy, Herceptin, and antiestrogen therapy. We discussed a sentinel lymph node biopsy as she does not appear to having lymph node involvement right now. We discussed the performance of that with injection of radioactive tracer and blue dye. We discussed that she would have an incision underneath her axillary hairline. We discussed that there is a bout a 10-20% chance of having a positive node with a sentinel lymph node biopsy and we will await the permanent pathology to make any other first further decisions in terms of her  treatment. One of these options might be to return to the operating room to perform an axillary lymph node dissection. We discussed about a 1-2% risk lifetime of chronic shoulder pain as well as lymphedema associated with a sentinel lymph node biopsy. We discussed the options for treatment of the breast cancer which included lumpectomy versus a mastectomy. We discussed the performance of the lumpectomy with a wire placement. We discussed a 10-20% chance of a positive margin requiring reexcision in the operating room. We also discussed that she may need radiation therapy or antiestrogen therapy or both if she undergoes lumpectomy. We discussed the mastectomy and the postoperative care for that as well. We discussed that there is no difference in her survival whether she undergoes lumpectomy with radiation therapy or antiestrogen therapy versus a mastectomy. There is a slight difference in the local recurrence rate being 3-5% with lumpectomy and about 1% with a mastectomy. We discussed the risks of operation including bleeding, infection, possible reoperation. She understands her further therapy will be based on what her stages at the time of her operation.  Pt Education - [facility] breast cancer surgery: discussed with patient and provided information. Pt Education - [facility] Breast Biopsy HCI: discussed with patient and provided information. Pt Education - ABC (After Breast Cancer) Class Info: discussed with patient and provided information. Pt Education - [facility] Breast Pains Education  RADIAL SCAR OF LEFT BREAST (N64.89)

## 2019-11-17 NOTE — H&P (Signed)
Teresa Dunn Appointment: 11/17/2019 9:20 AM Location: [facility] Patient #: [id number] DOB: 1965/09/16 Married / Language: Cleophus Molt / Race: White Female  History of Present Illness Marcello Moores A. Garmon Dehn MD; 11/17/2019 1:41 PM) Patient words: Patient presents for evaluation of her right breast cancer. She recently underwent screening mammography. An area of distortion was noted right breast upper quadrant measuring 8 mm in maximal diameter. On the left breast calcifications noted. 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 Left breast showed radial scar. Patient denies history of breast pain, nipple discharge or change in the appearance of either breast. She had 2 grandmothers with breast cancer in their 37s and 59s.    The patient was called back for right breast distortion and left breast calcifications. EXAM: DIGITAL DIAGNOSTIC BILATERAL MAMMOGRAM WITH TOMO ULTRASOUND RIGHT BREAST COMPARISON: Previous exam(s). ACR Breast Density Category b: There are scattered areas of fibroglandular density. FINDINGS: There is a mass with associated distortion in the upper outer right breast. There is a group of calcifications in the inferior central left breast which are new compared to 2011. On physical exam, no suspicious lumps are identified. Targeted ultrasound is performed, showing a mass with associated distortion in the right breast at 11:30, 2 cm from the nipple measuring 8 x 6 by 9 mm. No axillary adenopathy. IMPRESSION: Highly suspicious mass in the right breast with associated distortion. Indeterminate calcifications in the inferior central left breast, new since 2011. RECOMMENDATION: Recommend ultrasound-guided biopsy of the right breast mass. Recommend stereotactic biopsy of the new left breast calcifications. I have discussed the findings and recommendations with the patient. If applicable, a reminder letter will be sent  to the patient regarding the next appointment. BI-RADS CATEGORY 5: Highly suggestive of malignancy. Electronically Signed By: Dorise Bullion III M.D On: 11/04/2019 16:03  Result History  MM DIAG BREAST TOMO BILATERAL (Order #[id number]) on 11/04/2019 - Order Result History Report <[email or web address]>  US BREAST LTD UNI RIGHT INC AXILLA (Order [id number]) - Reflex for Order [id number] Study Result  CLINICAL DATA: The patient was called back for right breast distortion and left breast calcifications. EXAM: DIGITAL DIAGNOSTIC BILATERAL MAMMOGRAM WITH TOMO ULTRASOUND RIGHT BREAST COMPARISON: Previous exam(s). ACR Breast Density Category b: There are scattered areas of fibroglandular density. FINDINGS: There is a mass with associated distortion in the upper outer right breast. There is a group of calcifications in the inferior central left breast which are new compared to 2011. On physical exam, no suspicious lumps are identified. Targeted ultrasound is performed, showing a mass with associated distortion in the right breast at 11:30, 2 cm from the nipple measuring 8 x 6 by 9 mm. No axillary adenopathy. IMPRESSION: Highly suspicious mass in the right breast with associated distortion. Indeterminate calcifications in the inferior central left breast, new since 2011. RECOMMENDATION: Recommend ultrasound-guided biopsy of the right breast mass. Recommend stereotactic biopsy of the new left breast calcifications. I have discussed the findings and recommendations with the patient. If applicable, a reminder letter will be sent to the patient regarding the next appointment. BI-RADS CATEGORY 5: Highly suggestive of malignancy. Electronically Signed By: Dorise Bullion III M.D On: 11/04/2019 16:03  ADDITIONAL INFORMATION: PROGNOSTIC INDICATORS Results: IMMUNOHISTOCHEMICAL AND MORPHOMETRIC ANALYSIS PERFORMED MANUALLY The tumor cells are NEGATIVE for Her2 (1+). Estrogen  Receptor: 100%, POSITIVE, STRONG STAINING INTENSITY Progesterone Receptor: 90%, POSITIVE, STRONG STAINING INTENSITY Proliferation Marker Ki67: 10% REFERENCE RANGE ESTROGEN RECEPTOR NEGATIVE 0% POSITIVE =>1% REFERENCE RANGE PROGESTERONE RECEPTOR NEGATIVE 0% POSITIVE =>1%  All controls stained appropriately Enid Cutter MD Pathologist, Electronic Signature ( Signed 11/11/2019) FINAL DIAGNOSIS Diagnosis Breast, right, needle core biopsy, 11:30 o'clock - INVASIVE DUCTAL CARCINOMA. SEE NOTE 1 of 2 FINAL for Beedy, Meryl [place] ([id number]) Diagnosis Note Invasive carcinoma measures 1 cm in greatest linear dimension and appears grade 1. A breast prognostic profile (ER, PR, Ki-67 and HER2) is pending and will be reported in an addendum. Dr. Tresa Moore reviewed the case and concurs with the diagnosis. [facility] was notified on 11/10/2019. Jaquita Folds MD Pathologist, Electronic Signature (Case signed 11/10/2019)       Diagnosis Breast, left, needle core biopsy, LIQ - COMPLEX SCLEROSING LESION WITH USUAL DUCTAL HYPERPLASIA AND CALCIFICATIONS. - FIBROCYSTIC CHANGES WITH USUAL DUCTAL HYPERPLASIA AND CALCIFICATIONS. - SEE MICROSCOPIC DESCRIPTION. Microscopic Comment Called to [facility] on 11/12/19. (JDP:ah 11/12/19) Claudette Laws MD Pathologist, Electronic Signature (Case signed 11/12/2019) Specimen Gross and Clinical Information Specimen Comment TIF 0755, CIT < 5 min, calcifications Specimen(s) Obtained: Breast, left, needle core biopsy, LIQ Specimen Clinical.  The patient is a 54 year old female.   Past Surgical History Sharyn Lull R. Brooks, CMA; 11/17/2019 9:59 AM) Breast Biopsy Bilateral. Knee Surgery Left. Shoulder Surgery Left.  Diagnostic Studies History Sharyn Lull R. Brooks, CMA; 11/17/2019 9:59 AM) Colonoscopy within last year Mammogram within last year Pap Smear 1-5 years ago  Allergies Sharyn Lull R. Brooks, CMA;  11/17/2019 10:00 AM) Codeine Phosphate *ANALGESICS - OPIOID* Aspirin *ANALGESICS - NonNarcotic*  Medication History (Michelle R. Brooks, CMA; 11/17/2019 10:01 AM) Symbicort (160-4.5MCG/ACT Aerosol, Inhalation) Active. Montelukast Sodium (10MG Tablet, Oral) Active. Lisinopril (30MG Tablet, Oral) Active. Omeprazole (40MG Capsule DR, Oral) Active. Sertraline HCl (50MG Tablet, Oral) Active. Wixela Inhub (100-50MCG/DOSE Aero Pow Br Act, Inhalation) Active. Medications Reconciled  Social History Sharyn Lull R. Brooks, CMA; 11/17/2019 9:59 AM) Alcohol use Occasional alcohol use. Caffeine use Coffee. No drug use Tobacco use Former smoker.  Family History Sharyn Lull R. Rolena Infante, CMA; 11/17/2019 9:59 AM) Arthritis Mother. Depression Mother. Hypertension Mother.  Pregnancy / Birth History Sharyn Lull R. Brooks, CMA; 11/17/2019 9:59 AM) Age at menarche 76 years. Contraceptive History Oral contraceptives. Gravida 2 Irregular periods Maternal age 39-25 Para 2  Other Problems Sharyn Lull R. Brooks, CMA; 11/17/2019 9:59 AM) Anxiety Disorder Asthma Breast Cancer Cervical Cancer Depression Gastroesophageal Reflux Disease High blood pressure Lump In Breast     Review of Systems [facility] R. Brooks CMA; 11/17/2019 9:59 AM) General Not Present- Appetite Loss, Chills, Fatigue, Fever, Night Sweats, Weight Gain and Weight Loss. Skin Not Present- Change in Wart/Mole, Dryness, Hives, Jaundice, New Lesions, Non-Healing Wounds, Rash and Ulcer. HEENT Not Present- Earache, Hearing Loss, Hoarseness, Nose Bleed, Oral Ulcers, Ringing in the Ears, Seasonal Allergies, Sinus Pain, Sore Throat, Visual Disturbances, Wears glasses/contact lenses and Yellow Eyes. Respiratory Not Present- Bloody sputum, Chronic Cough, Difficulty Breathing, Snoring and Wheezing. Cardiovascular Not Present- Chest Pain, Difficulty Breathing Lying Down, Leg Cramps, Palpitations, Rapid Heart Rate, Shortness of Breath and  Swelling of Extremities. Gastrointestinal Not Present- Abdominal Pain, Bloating, Bloody Stool, Change in Bowel Habits, Chronic diarrhea, Constipation, Difficulty Swallowing, Excessive gas, Gets full quickly at meals, Hemorrhoids, Indigestion, Nausea, Rectal Pain and Vomiting. Female Genitourinary Not Present- Frequency, Nocturia, Painful Urination, Pelvic Pain and Urgency. Musculoskeletal Not Present- Back Pain, Joint Pain, Joint Stiffness, Muscle Pain, Muscle Weakness and Swelling of Extremities. Neurological Not Present- Decreased Memory, Fainting, Headaches, Numbness, Seizures, Tingling, Tremor, Trouble walking and Weakness. Psychiatric Present- Anxiety and Depression. Not Present- Bipolar, Change in Sleep Pattern, Fearful and  Frequent crying. Endocrine Not Present- Cold Intolerance, Excessive Hunger, Hair Changes, Heat Intolerance, Hot flashes and New Diabetes. Hematology Not Present- Blood Thinners, Easy Bruising, Excessive bleeding, Gland problems, HIV and Persistent Infections.  Vitals [employer] R. Brooks CMA; 11/17/2019 9:59 AM) 11/17/2019 9:59 AM Weight: 247.5 lb Height: 67in Body Surface Area: 2.21 m Body Mass Index: 38.76 kg/m  Pulse: 82 (Regular)  BP: 132/88(Sitting, Left Arm, Standard)        Physical Exam (Rollo Farquhar A. Santosha Jividen MD; 11/17/2019 1:41 PM)  General Mental Status-Alert. General Appearance-Consistent with stated age. Hydration-Well hydrated. Voice-Normal.  Head and Neck Head-normocephalic, atraumatic with no lesions or palpable masses. Trachea-midline. Thyroid Gland Characteristics - normal size and consistency.  Eye Eyeball - Bilateral-Extraocular movements intact. Sclera/Conjunctiva - Bilateral-No scleral icterus.  Chest and Lung Exam Chest and lung exam reveals -quiet, even and easy respiratory effort with no use of accessory muscles and on auscultation, normal breath sounds, no adventitious sounds and normal vocal  resonance. Inspection Chest Wall - Normal. Back - normal.  Breast Breast - Left-Symmetric, Non Tender, No Biopsy scars, no Dimpling - Left, No Inflammation, No Lumpectomy scars, No Mastectomy scars, No Peau d' Orange. Breast - Right-Symmetric, Non Tender, No Biopsy scars, no Dimpling - Right, No Inflammation, No Lumpectomy scars, No Mastectomy scars, No Peau d' Orange. Breast Lump-No Palpable Breast Mass.  Cardiovascular Cardiovascular examination reveals -normal heart sounds, regular rate and rhythm with no murmurs and normal pedal pulses bilaterally.  Abdomen Inspection Inspection of the abdomen reveals - No Hernias. Skin - Scar - no surgical scars. Palpation/Percussion Palpation and Percussion of the abdomen reveal - Soft, Non Tender, No Rebound tenderness, No Rigidity (guarding) and No hepatosplenomegaly. Auscultation Auscultation of the abdomen reveals - Bowel sounds normal.  Neurologic Neurologic evaluation reveals -alert and oriented x 3 with no impairment of recent or remote memory. Mental Status-Normal.  Musculoskeletal Normal Exam - Left-Upper Extremity Strength Normal and Lower Extremity Strength Normal. Normal Exam - Right-Upper Extremity Strength Normal and Lower Extremity Strength Normal.  Lymphatic Head & Neck  General Head & Neck Lymphatics: Bilateral - Description - Normal. Axillary  General Axillary Region: Bilateral - Description - Normal. Tenderness - Non Tender. Femoral & Inguinal  Generalized Femoral & Inguinal Lymphatics: Bilateral - Description - Normal. Tenderness - Non Tender.    Assessment & Plan (Cherrise Occhipinti A. Kadesia Robel MD; 11/17/2019 1:42 PM)  BREAST CANCER, RIGHT (C50.911) Impression: Discussed breast conserving surgery versus mastectomy with reconstruction. Risks and benefits as well as long-term expectations of each discussed. She is opted for a procedure lumpectomy with left breast lumpectomy and right axillary sentinel lymph node  mapping  Risk of lumpectomy include bleeding, infection, seroma, more surgery, use of seed/wire, wound care, cosmetic deformity and the need for other treatments, death , blood clots, death. Pt agrees to proceed. Risk of sentinel lymph node mapping include bleeding, infection, lymphedema, shoulder pain. stiffness, dye allergy. cosmetic deformity , blood clots, death, need for more surgery. Pt agrees to proceed.  Refer to medical and radiation oncology Refer to genetics Total time 45 minutes  Current Plans You are being scheduled for surgery- Our schedulers will call you.  You should hear from our office's scheduling department within 5 working days about the location, date, and time of surgery. We try to make accommodations for patient's preferences in scheduling surgery, but sometimes the OR schedule or the surgeon's schedule prevents [place] from making those accommodations.  If you have not heard from our office [phone]) in 5 working days,  call the office and ask for your surgeon's nurse.  If you have other questions about your diagnosis, plan, or surgery, call the office and ask for your surgeon's nurse.  Pt Education - [facility] Breast Cancer Information Given - [facility] "Breast Journey" Package We discussed the staging and pathophysiology of breast cancer. We discussed all of the different options for treatment for breast cancer including surgery, chemotherapy, radiation therapy, Herceptin, and antiestrogen therapy. We discussed a sentinel lymph node biopsy as she does not appear to having lymph node involvement right now. We discussed the performance of that with injection of radioactive tracer and blue dye. We discussed that she would have an incision underneath her axillary hairline. We discussed that there is a bout a 10-20% chance of having a positive node with a sentinel lymph node biopsy and we will await the permanent pathology to make any other first further decisions in terms of her  treatment. One of these options might be to return to the operating room to perform an axillary lymph node dissection. We discussed about a 1-2% risk lifetime of chronic shoulder pain as well as lymphedema associated with a sentinel lymph node biopsy. We discussed the options for treatment of the breast cancer which included lumpectomy versus a mastectomy. We discussed the performance of the lumpectomy with a wire placement. We discussed a 10-20% chance of a positive margin requiring reexcision in the operating room. We also discussed that she may need radiation therapy or antiestrogen therapy or both if she undergoes lumpectomy. We discussed the mastectomy and the postoperative care for that as well. We discussed that there is no difference in her survival whether she undergoes lumpectomy with radiation therapy or antiestrogen therapy versus a mastectomy. There is a slight difference in the local recurrence rate being 3-5% with lumpectomy and about 1% with a mastectomy. We discussed the risks of operation including bleeding, infection, possible reoperation. She understands her further therapy will be based on what her stages at the time of her operation.  Pt Education - [facility] breast cancer surgery: discussed with patient and provided information. Pt Education - [facility] Breast Biopsy HCI: discussed with patient and provided information. Pt Education - ABC (After Breast Cancer) Class Info: discussed with patient and provided information. Pt Education - [facility] Breast Pains Education  RADIAL SCAR OF LEFT BREAST (N64.89)

## 2019-11-18 ENCOUNTER — Encounter: Payer: Self-pay | Admitting: Adult Health

## 2019-11-18 DIAGNOSIS — Z170 Estrogen receptor positive status [ER+]: Secondary | ICD-10-CM | POA: Insufficient documentation

## 2019-11-18 DIAGNOSIS — C50411 Malignant neoplasm of upper-outer quadrant of right female breast: Secondary | ICD-10-CM | POA: Insufficient documentation

## 2019-11-19 ENCOUNTER — Telehealth: Payer: Self-pay | Admitting: Oncology

## 2019-11-19 NOTE — Telephone Encounter (Signed)
Received new patient referral from Dr. Brantley Stage at [facility] for med onc and genetics. Ms. Teresa Dunn has been cld and scheduled to see Dr. Jana Hakim on 6/10 at 4pm w/labs at 3:30pm and genetics on 7/7 at 9am w/Karen for an in person visit. Pt aware to arrive 15 minutes early.

## 2019-11-25 ENCOUNTER — Encounter (HOSPITAL_BASED_OUTPATIENT_CLINIC_OR_DEPARTMENT_OTHER): Payer: Self-pay | Admitting: Surgery

## 2019-11-25 ENCOUNTER — Other Ambulatory Visit: Payer: Self-pay | Admitting: *Deleted

## 2019-11-25 DIAGNOSIS — C50411 Malignant neoplasm of upper-outer quadrant of right female breast: Secondary | ICD-10-CM

## 2019-11-25 DIAGNOSIS — Z170 Estrogen receptor positive status [ER+]: Secondary | ICD-10-CM

## 2019-11-25 NOTE — Progress Notes (Signed)
[place]  [phone] [phone]     ID: Teresa Dunn DOB: 15-Nov-1965  MR#: [id number]  IOM#:[id number]  Patient Care Team: Leamon Arnt, MD as PCP - General (Family Medicine) Erroll Luna, MD as Consulting Physician (General Surgery) Galena Logie, Virgie Dad, MD as Consulting Physician (Oncology) Kyung Rudd, MD as Consulting Physician (Radiation Oncology) Chauncey Cruel, MD OTHER MD:  CHIEF COMPLAINT: Estrogen receptor positive breast cancer  CURRENT TREATMENT: Awaiting definitive surgery   HISTORY OF CURRENT ILLNESS: Sandrine Bloodsworth had routine screening mammography on 10/26/2019 showing a possible mass in the right breast and possible calcifications in the left breast. She underwent bilateral diagnostic mammography with tomography and right breast ultrasonography at [facility] on 11/04/2019 showing: breast density category B; 9 mm right breast mass at 11:30; grouped calcifications in inferior central left breast; no right axillary adenopathy.  Accordingly on 11/09/2019 she proceeded to biopsy of the right breast area in question. The pathology from this procedure ([id number]) showed: invasive ductal carcinoma, grade 1. Prognostic indicators significant for: estrogen receptor, 100% positive and progesterone receptor, 90% positive, both with strong staining intensity. Proliferation marker Ki67 at 10%. HER2 negative by immunohistochemistry (1+).  Biopsy of the left breast calcifications was performed on 11/11/2019. Pathology [id number]) showed: complex sclerosing lesion with usual ductal hyperplasia and calcifications; fibrocystic changes with usual ductal hyperplasia and calcifications.  The patient's subsequent history is as detailed below.   INTERVAL HISTORY: Lakevia was evaluated in the breast cancer clinic on 11/26/2019. Her case was also presented at the multidisciplinary breast cancer conference on 11/18/2019. At that time  a preliminary plan was proposed: Bilateral lumpectomies with right sentinel lymph node sampling, genetics testing, likely no Oncotype, adjuvant radiation then antiestrogens  She is scheduled for bilateral lumpectomies on 12/03/2019 under Dr. Brantley Stage.   REVIEW OF SYSTEMS: There were no specific symptoms leading to the original mammogram, which was routinely scheduled. The patient denies unusual headaches, visual changes, nausea, vomiting, stiff neck, dizziness, or gait imbalance. There has been no cough, phlegm production, or pleurisy, no chest pain or pressure, and no change in bowel or bladder habits. The patient denies fever, rash, bleeding, unexplained fatigue or unexplained weight loss.  She does have a rash in both axillae left greater than right which she wanted me to look at today A detailed review of systems was otherwise entirely negative.   PAST MEDICAL HISTORY: Past Medical History:  Diagnosis Date  . Anxiety   . Arthritis   . Asthma   . Cervical dysplasia 1990   s/p cryotherapy with normal f/u paps  . Depression   . 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 Quit 2016  . Genital warts   . GERD (gastroesophageal reflux disease)   . History of cardiac murmur    per pt, mild and no cardiologist  . Hypertension   . Primary osteoarthritis of right knee 09/02/2019    PAST SURGICAL HISTORY: Past Surgical History:  Procedure Laterality Date  . BREAST BIOPSY Right 11/09/2019  . [place]   with normal follow up paps  . KNEE SURGERY    . SHOULDER ARTHROSCOPY WITH SUBACROMIAL DECOMPRESSION Left 11/18/2014   Procedure: LEFT SHOULDER ARTHROSCOPY WITH SUBACROMIAL DECOMPRESSION/DEBRIDEMENT LABREAL/MINI DISTAL CLAVICLE RESECTION;  Surgeon: Susa Day, MD;  Location: [facility];  Service: Orthopedics;  Laterality: Left;  . TUBAL LIGATION      FAMILY HISTORY: Family History  Problem Relation Age of Onset  .  Heart disease Mother   . Hypertension  Mother   . Diabetes Mother   . Lung cancer Mother   . Bipolar disorder Mother   . Alcohol abuse Mother   . Heart disease Father   . Hypertension Father   . Diabetes Father   . Asthma Father   . Stroke Maternal Grandmother   . Breast cancer Maternal Grandmother   . ADD / ADHD Son   . Healthy Son   . Healthy Brother   . Breast cancer Paternal Grandmother   . Colon cancer Neg Hx   The patient's father is 79 years old as of June 2021.  His mother had breast cancer and died in her early 35s.  The patient's father had 3 brothers no sisters and they had no cancer.  The patient's mother died at age 23 with lung cancer.  Her mother had breast cancer and died in her 26s.  The patient's mother had 2 sisters and 2 brothers who did not have cancer.  The patient herself has 1 brother and 1 sister with no history of cancer.   GYNECOLOGIC HISTORY:  No LMP recorded. (Menstrual status: Perimenopausal). Menarche: 54 years old Age at first live birth: 54 years old [place] P 2 LMP January 2021 HRT n/a  Hysterectomy? no BSO? 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  Her husband Jeneen Rinks works for [employer] which is a Administrator, Civil Service.  Daughter Gari Crown, 22 lives in [place] and works in an ophthalmologist's office as a Merchant navy officer.  Son [place], 20, works as a Aeronautical engineer for [employer].  The patient has 1 grandchild.  She is not a Ambulance person.Marland Kitchen    ADVANCED DIRECTIVES: In the absence of any documentation to the contrary, the patient's spouse is their HCPOA.    HEALTH MAINTENANCE: Social History   Tobacco Use  . Smoking status: Former Smoker    Packs/day: 1.50    Years: 30.00    Pack years: 45.00    Types: Cigarettes    Quit date: 04/16/2014    Years since quitting: 5.6  . Smokeless tobacco: Never Used  Vaping Use  . Vaping Use: Never used  Substance Use Topics  . Alcohol use: Yes    Comment: socially   .  Drug use: No    Comment: used marijuana in past as a teenager     Colonoscopy: never done, negative Cologuard 04/2019  PAP: 03/2018, negative  Bone density: n/a (age)   Allergies  Allergen Reactions  . Codeine Nausea And Vomiting  . Asa [Aspirin] Other (See Comments)    wheezing    Current Outpatient Medications  Medication Sig Dispense Refill  . albuterol (VENTOLIN HFA) 108 (90 Base) MCG/ACT inhaler Inhale 1-2 puffs into the lungs every 4 (four) hours as needed for wheezing or shortness of breath. 18 g 5  . clobetasol cream (TEMOVATE) 5.46 % Apply 1 application topically every morning.  3  . lisinopril (ZESTRIL) 30 MG tablet Take 1 tablet (30 mg total) by mouth at bedtime. 90 tablet 3  . loratadine (CLARITIN) 10 MG tablet Take 10 mg by mouth daily as needed for allergies.     . montelukast (SINGULAIR) 10 MG tablet Take 1 tablet (10 mg total) by mouth at bedtime. 30 tablet 11  . omeprazole (PRILOSEC) 40 MG capsule Take 1 capsule (40 mg total) by mouth 2 (two) times daily. 180 capsule 3  .  sertraline (ZOLOFT) 50 MG tablet Take 1 tablet (50 mg total) by mouth at bedtime. 90 tablet 3  . SYMBICORT 160-4.5 MCG/ACT inhaler SMARTSIG:2 Puff(s) Via Inhaler Every Morning    . triamcinolone (NASACORT ALLERGY 24HR) 55 MCG/ACT AERO nasal inhaler Place 1 spray into the nose daily as needed (congestion/allergies.). 1 Inhaler 11   No current facility-administered medications for this visit.    OBJECTIVE: White woman in no acute distress  Vitals:   11/26/19 1540  BP: (!) 144/75  Pulse: 66  Resp: 17  Temp: 98.9 F (37.2 C)  SpO2: 99%     Body mass index is 39 kg/m.   Wt Readings from Last 3 Encounters:  11/26/19 249 lb (112.9 kg)  11/26/19 245 lb (111.1 kg)  09/02/19 248 lb (112.5 kg)      ECOG FS:1 - Symptomatic but completely ambulatory  Ocular: Sclerae unicteric, pupils round and equal Ear-nose-throat: Wearing a mask Lymphatic: No cervical or supraclavicular adenopathy Lungs no  rales or rhonchi Heart regular rate and rhythm Abd soft, nontender, positive bowel sounds MSK no focal spinal tenderness, no joint edema Neuro: non-focal, well-oriented, appropriate affect Breasts: I do not palpate a mass in either breast.  There are no skin or nipple changes of concern.  Both axillae show mild hidradenitis, left greater than right   LAB RESULTS:  CMP     Component Value Date/Time   NA 138 11/26/2019 1524   K 4.6 11/26/2019 1524   CL 105 11/26/2019 1524   CO2 25 11/26/2019 1524   GLUCOSE 98 11/26/2019 1524   BUN 13 11/26/2019 1524   CREATININE 0.71 11/26/2019 1524   CALCIUM 9.5 11/26/2019 1524   PROT 7.3 11/26/2019 1524   ALBUMIN 4.0 11/26/2019 1524   AST 17 11/26/2019 1524   ALT 18 11/26/2019 1524   ALKPHOS 107 11/26/2019 1524   BILITOT 0.5 11/26/2019 1524   GFRNONAA >60 11/26/2019 1524   GFRAA >60 11/26/2019 1524    No results found for: TOTALPROTELP, ALBUMINELP, A1GS, A2GS, BETS, BETA2SER, GAMS, MSPIKE, SPEI  Lab Results  Component Value Date   WBC 6.4 11/26/2019   NEUTROABS 4.2 11/26/2019   HGB 12.2 11/26/2019   HCT 36.7 11/26/2019   MCV 89.1 11/26/2019   PLT 268 11/26/2019    No results found for: LABCA2  No components found for: HALPFX902  No results for input(s): INR in the last 168 hours.  No results found for: LABCA2  No results found for: IOX735  No results found for: HGD924  No results found for: QAS341  No results found for: CA2729  No components found for: HGQUANT  No results found for: CEA1 / No results found for: CEA1   No results found for: AFPTUMOR  No results found for: CHROMOGRNA  No results found for: KPAFRELGTCHN, LAMBDASER, KAPLAMBRATIO (kappa/lambda light chains)  No results found for: HGBA, HGBA2QUANT, HGBFQUANT, HGBSQUAN (Hemoglobinopathy evaluation)   No results found for: LDH  No results found for: IRON, TIBC, IRONPCTSAT (Iron and TIBC)  No results found for: FERRITIN  Urinalysis    Component  Value Date/Time   COLORURINE STRAW (A) 05/25/2016 1153   APPEARANCEUR CLEAR 05/25/2016 1153   LABSPEC 1.006 05/25/2016 1153   PHURINE 6.0 05/25/2016 1153   GLUCOSEU NEGATIVE 05/25/2016 1153   HGBUR NEGATIVE 05/25/2016 1153   BILIRUBINUR Negative 02/11/2019 1411   KETONESUR NEGATIVE 05/25/2016 1153   PROTEINUR Negative 02/11/2019 1411   PROTEINUR NEGATIVE 05/25/2016 1153   UROBILINOGEN 0.2 02/11/2019 1411   NITRITE Negative  02/11/2019 1411   NITRITE NEGATIVE 05/25/2016 1153   LEUKOCYTESUR Negative 02/11/2019 1411     STUDIES: US BREAST LTD UNI RIGHT INC AXILLA  Result Date: 11/04/2019 CLINICAL DATA:  The patient was called back for right breast distortion and left breast calcifications. EXAM: DIGITAL DIAGNOSTIC BILATERAL MAMMOGRAM WITH TOMO ULTRASOUND RIGHT BREAST COMPARISON:  Previous exam(s). ACR Breast Density Category b: There are scattered areas of fibroglandular density. FINDINGS: There is a mass with associated distortion in the upper outer right breast. There is a group of calcifications in the inferior central left breast which are new compared to 2011. On physical exam, no suspicious lumps are identified. Targeted ultrasound is performed, showing a mass with associated distortion in the right breast at 11:30, 2 cm from the nipple measuring 8 x 6 by 9 mm. No axillary adenopathy. IMPRESSION: Highly suspicious mass in the right breast with associated distortion. Indeterminate calcifications in the inferior central left breast, new since 2011. RECOMMENDATION: Recommend ultrasound-guided biopsy of the right breast mass. Recommend stereotactic biopsy of the new left breast calcifications. I have discussed the findings and recommendations with the patient. If applicable, a reminder letter will be sent to the patient regarding the next appointment. BI-RADS CATEGORY  5: Highly suggestive of malignancy. Electronically Signed   By: Dorise Bullion III M.D   On: 11/04/2019 16:03   MM DIAG BREAST  TOMO BILATERAL  Result Date: 11/04/2019 CLINICAL DATA:  The patient was called back for right breast distortion and left breast calcifications. EXAM: DIGITAL DIAGNOSTIC BILATERAL MAMMOGRAM WITH TOMO ULTRASOUND RIGHT BREAST COMPARISON:  Previous exam(s). ACR Breast Density Category b: There are scattered areas of fibroglandular density. FINDINGS: There is a mass with associated distortion in the upper outer right breast. There is a group of calcifications in the inferior central left breast which are new compared to 2011. On physical exam, no suspicious lumps are identified. Targeted ultrasound is performed, showing a mass with associated distortion in the right breast at 11:30, 2 cm from the nipple measuring 8 x 6 by 9 mm. No axillary adenopathy. IMPRESSION: Highly suspicious mass in the right breast with associated distortion. Indeterminate calcifications in the inferior central left breast, new since 2011. RECOMMENDATION: Recommend ultrasound-guided biopsy of the right breast mass. Recommend stereotactic biopsy of the new left breast calcifications. I have discussed the findings and recommendations with the patient. If applicable, a reminder letter will be sent to the patient regarding the next appointment. BI-RADS CATEGORY  5: Highly suggestive of malignancy. Electronically Signed   By: Dorise Bullion III M.D   On: 11/04/2019 16:03   MM CLIP PLACEMENT LEFT  Result Date: 11/11/2019 CLINICAL DATA:  Post stereotactic guided biopsy of calcifications in the lower inner left breast. EXAM: DIAGNOSTIC LEFT MAMMOGRAM POST STEREOTACTIC BIOPSY COMPARISON:  Previous exams. FINDINGS: Mammographic images were obtained following stereotactic guided biopsy of calcifications in the lower inner left breast. A coil shaped biopsy marking clip is present at the site of the biopsied calcifications in the lower inner left breast. A small to moderate post biopsy hematoma is present. IMPRESSION: Coil shaped biopsy marking clip at  site of biopsied calcifications in the lower inner left breast. Final Assessment: Post Procedure Mammograms for Marker Placement Electronically Signed   By: Everlean Alstrom M.D.   On: 11/11/2019 08:14   MM CLIP PLACEMENT RIGHT  Result Date: 11/09/2019 CLINICAL DATA:  54 year old female presenting for biopsy of a right breast mass. EXAM: DIAGNOSTIC RIGHT MAMMOGRAM POST ULTRASOUND BIOPSY COMPARISON:  Previous  exam(s). FINDINGS: Mammographic images were obtained following ultrasound guided biopsy of a right breast mass at 11:30 o'clock. The biopsy marking clip is in expected position at the site of biopsy. IMPRESSION: Appropriate positioning of the coil shaped biopsy marking clip at the site of biopsy in the right breast at 11:30 o'clock. Final Assessment: Post Procedure Mammograms for Marker Placement Electronically Signed   By: Audie Pinto M.D.   On: 11/09/2019 10:14   MM LT BREAST BX W LOC DEV 1ST LESION IMAGE BX SPEC STEREO GUIDE  Addendum Date: 11/17/2019   ADDENDUM REPORT: 11/13/2019 09:01 ADDENDUM: Pathology revealed COMPLEX SCLEROSING LESION WITH USUAL DUCTAL HYPERPLASIA AND CALCIFICATIONS, FIBROCYSTIC CHANGES WITH USUAL DUCTAL HYPERPLASIA AND CALCIFICATIONS of the LEFT breast, lower inner quadrant. This was found to be concordant by Dr. Everlean Alstrom, with excision recommended. Pathology results were discussed with the patient by telephone. The patient reported doing well after the biopsy with tenderness at the site. Post biopsy instructions and care were reviewed and questions were answered. The patient was encouraged to call [facility] for any additional concerns. The patient has a recent diagnosis of RIGHT breast cancer and is scheduled to see Dr. Erroll Luna at [facility] on November 17, 2019. Pathology results reported by Terie Purser, RN on 11/13/2019. Electronically Signed   By: Everlean Alstrom M.D.   On: 11/13/2019 09:01   Result Date:  11/17/2019 CLINICAL DATA:  54 year old female with indeterminate calcifications in the lower inner left breast. EXAM: LEFT BREAST STEREOTACTIC CORE NEEDLE BIOPSY COMPARISON:  Previous exams. FINDINGS: The patient and I discussed the procedure of stereotactic-guided biopsy including benefits and alternatives. We discussed the high likelihood of a successful procedure. We discussed the risks of the procedure including infection, bleeding, tissue injury, clip migration, and inadequate sampling. Informed written consent was given. The usual time out protocol was performed immediately prior to the procedure. Using sterile technique and 1% Lidocaine as local anesthetic, under stereotactic guidance, a 9 gauge vacuum assisted device was used to perform core needle biopsy of the calcifications in the lower inner left breast using a medial to lateral approach. Specimen radiograph was performed showing the presence of calcifications. Specimens with calcifications are identified for pathology. Lesion quadrant: Lower inner At the conclusion of the procedure, a coil shaped tissue marker clip was deployed into the biopsy cavity. Follow-up 2-view mammogram was performed and dictated separately. IMPRESSION: Stereotactic-guided biopsy of the calcifications in the lower inner left breast. No apparent complications. Electronically Signed: By: Everlean Alstrom M.D. 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 This was found to be concordant by Dr. Audie Pinto. Pathology results were discussed with the patient by telephone. The patient reported doing well after the biopsy with tenderness at the site. Post biopsy instructions and care were reviewed and questions were answered. The patient was encouraged to call [facility] for any additional concerns. The patient is scheduled for additional stereotactic-guided biopsy for separate area of calcifications in the LEFT breast on 11/11/2019. Surgical consultation has been arranged with Dr. Erroll Luna at [facility] on November 17, 2019. Pathology results reported by Stacie Acres RN on 11/10/2019. Electronically Signed   By: Audie Pinto M.D.   On: 11/10/2019 14:24  Result Date: 11/10/2019 CLINICAL DATA:  54 year old female presenting for biopsy of a right breast mass. EXAM: ULTRASOUND GUIDED RIGHT BREAST CORE NEEDLE BIOPSY COMPARISON:  Previous exam(s). PROCEDURE: I met with the patient and we discussed the procedure of ultrasound-guided biopsy, including benefits and alternatives. We discussed the high likelihood of a successful procedure. We discussed the risks of the procedure, including infection, bleeding, tissue injury, clip migration, and inadequate sampling. Informed written consent was given. The usual time-out protocol was performed immediately prior to the procedure. Lesion quadrant: Upper outer quadrant Using sterile technique and 1% Lidocaine as local anesthetic, under direct ultrasound visualization, a 14 gauge spring-loaded device was used to perform biopsy of a right breast mass at 11:30 o'clock using a lateral approach. At the conclusion of the procedure a coil tissue marker clip was deployed into the biopsy cavity. Follow up 2 view mammogram was performed and dictated separately. IMPRESSION: Ultrasound guided biopsy of a right breast mass at 11:30 o'clock. No apparent complications. Electronically Signed: By: Audie Pinto M.D. On: 11/09/2019 10:15     ELIGIBLE FOR AVAILABLE RESEARCH PROTOCOL: AET  ASSESSMENT: 54 y.o. [place] woman status post bilateral breast biopsies both 11/09/2019 showing  (a) on the left, a complex sclerosing lesion  (b) on the right, a clinical T1b N0, stage IA invasive ductal carcinoma, grade 1,  estrogen and progesterone receptor positive, HER-2 not amplified, with an MIB-1 of 10%.  (1) definitive surgery pending  (2) adjuvant radiation to follow  (3) to start tamoxifen at the completion of local treatment.  PLAN: I met today with Shalane to review her new diagnosis. Specifically we discussed the biology of her breast cancer, its diagnosis, staging, treatment  options and prognosis. We first reviewed the fact that cancer is not one disease but more than 100 different diseases and that it is important to keep them separate-- otherwise when friends and relatives discuss their own cancer experiences with Sharicka confusion can result. Similarly we explained that if breast cancer spreads to the bone or liver, the patient would not have bone cancer or liver cancer, but breast cancer in the bone and breast cancer in the liver: one cancer in three places-- not 3 different cancers which otherwise would have to be treated in 3 different ways.  We discussed the difference between local and systemic therapy. In terms of loco-regional treatment, lumpectomy plus radiation is equivalent to mastectomy as far as survival is concerned. For this reason, and because the cosmetic results are generally superior, we recommend breast conserving surgery. ]  We then discussed the rationale for systemic therapy. There is some risk that this cancer may have already spread to other parts of her body. Patients frequently ask at this point about bone scans, CAT scans and PET scans to find out if they have occult breast cancer somewhere else. The problem is that in early stage disease we are much more likely to find false positives then true cancers and this would expose the patient to unnecessary procedures as well as unnecessary radiation. Scans cannot answer the question the patient really would like to know, which is whether she has microscopic disease elsewhere in her body. For those reasons we do not recommend them.  Of  course we would proceed to aggressive evaluation of any symptoms that might suggest metastatic disease, but that is not the case here.  Next we went over the options for systemic therapy which are anti-estrogens, anti-HER-2 immunotherapy, and chemotherapy. Amari does not meet criteria for anti-HER-2  immunotherapy. She is a good candidate for anti-estrogens.  The question of chemotherapy is more complicated. Chemotherapy is most effective in rapidly growing, aggressive tumors. It is much less effective in low-grade, slow growing cancers, like Laryssa 's.  Given that and also the very small size of this tumor, we are not planning to send an Oncotype but instead will forego the chemotherapy option unless there is a surprise from the surgery  The overall plan then is for surgery, then adjuvant radiation, then tamoxifen  I did start her on daily doxycycline for her hidradenitis.  She will let me know if that does not work for her.  Ilithyia has a good understanding of the overall plan. She agrees with it. She knows the goal of treatment in her case is cure. She will call with any problems that may develop before her next visit here.  Total encounter time 65 minutes.Sarajane Jews C. Terricka Onofrio, MD 11/26/2019 4:21 PM Medical Oncology and Hematology [facility] [place], [place] [address] Tel. [phone]    Fax. [phone]   This document serves as a record of services personally performed by Lurline Del, MD. It was created on his behalf by Wilburn Mylar, a trained medical scribe. The creation of this record is based on the scribe's personal observations and the provider's statements to them.   I, Lurline Del MD, have reviewed the above documentation for accuracy and completeness, and I agree with the above.    *Total Encounter Time as defined by the Centers for Medicare and Medicaid Services includes, in addition to the face-to-face time of a patient visit  (documented in the note above) non-face-to-face time: obtaining and reviewing outside history, ordering and reviewing medications, tests or procedures, care coordination (communications with other health care professionals or caregivers) and documentation in the medical record.

## 2019-11-25 NOTE — Progress Notes (Signed)
Location of Breast Cancer: Right Breast Cancer  Did patient present with symptoms (if so, please note symptoms) or was this found on screening mammography?: Routine Mammogram  Screening Mammogram: Area of distortion in the right breast upper quadrant measuring 8 mm in maximal diameter.  Left breast calcifications noted.  Histology per Pathology Report: Right Breast 11/09/2019  Receptor Status: ER(100% +), PR (90% +), Her2-neu (-), Ki-67(10%)    Past/Anticipated interventions by surgeon, if any: Dr. Cornett 11/17/2019 -Discussed breast conserving surgery versus mastectomy with reconstruction. -She is opted for a procedure lumpectomy with left breast lumpectomy and right axillary sentinel lymph node mapping. -Referral to medical and radiation oncology. -Referral to genetics. -Bilateral Breast lumpectomy with radioactive seed and right sentinel lymph node mapping 12/03/2019  Past/Anticipated interventions by medical oncology, if any: Chemotherapy  Dr. Magrinat 11/26/2019 4pm   Lymphedema issues, if any:  No  Pain issues, if any:  Has occasional sharp pains in her right breast, nothing that lasts long.  SAFETY ISSUES:  Prior radiation? No  Pacemaker/ICD? No  Possible current pregnancy? Perimenopausal  Is the patient on methotrexate? No  Current Complaints / other details:   Genetics 12/23/2019     Silva, LaToya M, RN 11/25/2019,9:26 AM   

## 2019-11-26 ENCOUNTER — Other Ambulatory Visit: Payer: Self-pay | Admitting: Oncology

## 2019-11-26 ENCOUNTER — Encounter: Payer: Self-pay | Admitting: Radiation Oncology

## 2019-11-26 ENCOUNTER — Other Ambulatory Visit: Payer: Self-pay

## 2019-11-26 ENCOUNTER — Inpatient Hospital Stay: Payer: BC Managed Care – PPO

## 2019-11-26 ENCOUNTER — Ambulatory Visit
Admission: RE | Admit: 2019-11-26 | Discharge: 2019-11-26 | Disposition: A | Discharge status: Home / Self Care | Payer: BC Managed Care – PPO | Source: Ambulatory Visit | Attending: Radiation Oncology | Admitting: Radiation Oncology

## 2019-11-26 ENCOUNTER — Inpatient Hospital Stay: Payer: BC Managed Care – PPO | Attending: Oncology | Admitting: Oncology

## 2019-11-26 VITALS — Ht 67.0 in | Wt 245.0 lb

## 2019-11-26 VITALS — BP 144/75 | HR 66 | Temp 98.9°F | Resp 17 | Ht 67.0 in | Wt 249.0 lb

## 2019-11-26 DIAGNOSIS — Z803 Family history of malignant neoplasm of breast: Secondary | ICD-10-CM | POA: Diagnosis not present

## 2019-11-26 DIAGNOSIS — Z801 Family history of malignant neoplasm of trachea, bronchus and lung: Secondary | ICD-10-CM | POA: Diagnosis not present

## 2019-11-26 DIAGNOSIS — C50411 Malignant neoplasm of upper-outer quadrant of right female breast: Secondary | ICD-10-CM

## 2019-11-26 DIAGNOSIS — Z170 Estrogen receptor positive status [ER+]: Secondary | ICD-10-CM | POA: Diagnosis not present

## 2019-11-26 DIAGNOSIS — Z87891 Personal history of nicotine dependence: Secondary | ICD-10-CM | POA: Diagnosis not present

## 2019-11-26 DIAGNOSIS — L732 Hidradenitis suppurativa: Secondary | ICD-10-CM | POA: Diagnosis not present

## 2019-11-26 LAB — CBC WITH DIFFERENTIAL (CANCER CENTER ONLY)
Abs Immature Granulocytes: 0.01 10*3/uL (ref 0.00–0.07)
Basophils Absolute: 0.1 10*3/uL (ref 0.0–0.1)
Basophils Relative: 1 %
Eosinophils Absolute: 0.1 10*3/uL (ref 0.0–0.5)
Eosinophils Relative: 2 %
HCT: 36.7 % (ref 36.0–46.0)
Hemoglobin: 12.2 g/dL (ref 12.0–15.0)
Immature Granulocytes: 0 %
Lymphocytes Relative: 22 %
Lymphs Abs: 1.4 10*3/uL (ref 0.7–4.0)
MCH: 29.6 pg (ref 26.0–34.0)
MCHC: 33.2 g/dL (ref 30.0–36.0)
MCV: 89.1 fL (ref 80.0–100.0)
Monocytes Absolute: 0.6 10*3/uL (ref 0.1–1.0)
Monocytes Relative: 10 %
Neutro Abs: 4.2 10*3/uL (ref 1.7–7.7)
Neutrophils Relative %: 65 %
Platelet Count: 268 10*3/uL (ref 150–400)
RBC: 4.12 MIL/uL (ref 3.87–5.11)
RDW: 13.7 % (ref 11.5–15.5)
WBC Count: 6.4 10*3/uL (ref 4.0–10.5)
nRBC: 0 % (ref 0.0–0.2)

## 2019-11-26 LAB — CMP (CANCER CENTER ONLY)
ALT: 18 U/L (ref 0–44)
AST: 17 U/L (ref 15–41)
Albumin: 4 g/dL (ref 3.5–5.0)
Alkaline Phosphatase: 107 U/L (ref 38–126)
Anion gap: 8 (ref 5–15)
BUN: 13 mg/dL (ref 6–20)
CO2: 25 mmol/L (ref 22–32)
Calcium: 9.5 mg/dL (ref 8.9–10.3)
Chloride: 105 mmol/L (ref 98–111)
Creatinine: 0.71 mg/dL (ref 0.44–1.00)
GFR, Est AFR Am: >60 mL/min (ref >60)
GFR, Estimated: >60 mL/min (ref >60)
Glucose, Bld: 98 mg/dL (ref 70–99)
Potassium: 4.6 mmol/L (ref 3.5–5.1)
Sodium: 138 mmol/L (ref 135–145)
Total Bilirubin: 0.5 mg/dL (ref 0.3–1.2)
Total Protein: 7.3 g/dL (ref 6.5–8.1)

## 2019-11-26 MED ORDER — DOXYCYCLINE HYCLATE 100 MG PO TABS
100.0000 mg | ORAL_TABLET | Freq: Every day | ORAL | 1 refills | Status: DC
Start: 2019-11-26 — End: 2020-03-07

## 2019-11-27 ENCOUNTER — Encounter (HOSPITAL_BASED_OUTPATIENT_CLINIC_OR_DEPARTMENT_OTHER)
Admission: RE | Admit: 2019-11-27 | Discharge: 2019-11-27 | Disposition: A | Discharge status: Home / Self Care | Payer: BC Managed Care – PPO | Source: Ambulatory Visit | Attending: Surgery | Admitting: Surgery

## 2019-11-27 ENCOUNTER — Encounter: Payer: Self-pay | Admitting: *Deleted

## 2019-11-27 ENCOUNTER — Ambulatory Visit: Payer: BC Managed Care – PPO | Admitting: Hematology and Oncology

## 2019-11-27 DIAGNOSIS — Z01812 Encounter for preprocedural laboratory examination: Secondary | ICD-10-CM | POA: Insufficient documentation

## 2019-11-27 DIAGNOSIS — Z170 Estrogen receptor positive status [ER+]: Secondary | ICD-10-CM

## 2019-11-27 DIAGNOSIS — C50411 Malignant neoplasm of upper-outer quadrant of right female breast: Secondary | ICD-10-CM

## 2019-11-27 NOTE — Progress Notes (Signed)

## 2019-11-30 ENCOUNTER — Other Ambulatory Visit (HOSPITAL_COMMUNITY)
Admission: RE | Admit: 2019-11-30 | Discharge: 2019-11-30 | Disposition: A | Discharge status: Home / Self Care | Payer: BC Managed Care – PPO | Source: Ambulatory Visit | Attending: Surgery | Admitting: Surgery

## 2019-11-30 DIAGNOSIS — Z20822 Contact with and (suspected) exposure to covid-19: Secondary | ICD-10-CM | POA: Insufficient documentation

## 2019-11-30 DIAGNOSIS — Z01812 Encounter for preprocedural laboratory examination: Secondary | ICD-10-CM | POA: Insufficient documentation

## 2019-11-30 LAB — SARS CORONAVIRUS 2 (TAT 6-24 HRS): SARS Coronavirus 2: NEGATIVE

## 2019-11-30 NOTE — Addendum Note (Signed)
Encounter addended by: Kyung Rudd, MD on: 11/30/2019 11:41 AM  Actions taken: Clinical Note Signed

## 2019-11-30 NOTE — Progress Notes (Addendum)
Radiation Oncology         [phone] ________________________________  Name: Teresa Dunn MRN: [id number]  Date: 11/26/2019  DOB: 21-Aug-1965  CC:Teresa Arnt, MD  Teresa Luna, MD     REFERRING PHYSICIAN: Erroll Luna, MD   DIAGNOSIS: The encounter diagnosis was Malignant neoplasm of upper-outer quadrant of right breast in female, estrogen receptor positive ([place]).   HISTORY OF PRESENT ILLNESS::Teresa Dunn is a 54 y.o. female who is seen for an initial consultation visit regarding the patient's diagnosis of right-sided breast cancer.  The patient was found to have a 0.8 cm lesion within the right breast in the upper outer quadrant.  Some calcifications were also present within the left breast.  Biopsy of both of these areas has been performed.  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  Receptor studies indicated that the tumor is estrogen receptor positive, progesterone receptor positive, and HER-2/neu negative.  Ki-67 staining was 10%.  The lesion on the left did not return for any malignancy.  However this may benefit from resection as well.  The patient has therefore discussed with surgery potentially proceeding with a double lumpectomy and I have been asked to see the patient for possible consideration of adjuvant radiation treatment.    PREVIOUS RADIATION THERAPY: No   PAST MEDICAL HISTORY:  has a past medical history of Anxiety, Arthritis, Asthma, Cervical dysplasia (1990), Depression, Former heavy tobacco smoker (02/25/2018), Genital warts, GERD (gastroesophageal reflux disease), History of cardiac murmur, Hypertension, and Primary osteoarthritis of right knee (09/02/2019).     PAST SURGICAL HISTORY: Past Surgical History:  Procedure Laterality Date  . BREAST BIOPSY Right 11/09/2019  . [place]   with normal follow up paps  . KNEE SURGERY    . SHOULDER ARTHROSCOPY WITH SUBACROMIAL  DECOMPRESSION Left 11/18/2014   Procedure: LEFT SHOULDER ARTHROSCOPY WITH SUBACROMIAL DECOMPRESSION/DEBRIDEMENT LABREAL/MINI DISTAL CLAVICLE RESECTION;  Surgeon: Susa Day, MD;  Location: [facility];  Service: Orthopedics;  Laterality: Left;  . TUBAL LIGATION       FAMILY HISTORY: family history includes ADD / ADHD in her son; Alcohol abuse in her mother; Asthma in her father; Bipolar disorder in her mother; Breast cancer in her maternal grandmother and paternal grandmother; Diabetes in her father and mother; Healthy in her brother and son; Heart disease in her father and mother; Hypertension in her father and mother; Lung cancer in her mother; Stroke in her maternal grandmother.   SOCIAL HISTORY:  reports that she quit smoking about 5 years ago. Her smoking use included cigarettes. She has a 45.00 pack-year smoking history. She has never used smokeless tobacco. She reports current alcohol use. She reports that she does not use drugs.   ALLERGIES: Codeine and Asa [aspirin]   MEDICATIONS:  Current Outpatient Medications  Medication Sig Dispense Refill  . albuterol (VENTOLIN HFA) 108 (90 Base) MCG/ACT inhaler Inhale 1-2 puffs into the lungs every 4 (four) hours as needed for wheezing or shortness of breath. 18 g 5  . clobetasol cream (TEMOVATE) 9.62 % Apply 1 application topically every morning.  3  . lisinopril (ZESTRIL) 30 MG tablet Take 1 tablet (30 mg total) by mouth at bedtime. 90 tablet 3  . loratadine (CLARITIN) 10 MG tablet Take 10 mg by mouth daily as needed for allergies.     . montelukast (SINGULAIR) 10 MG tablet Take 1 tablet (10 mg total) by mouth at bedtime. 30 tablet 11  . omeprazole (  PRILOSEC) 40 MG capsule Take 1 capsule (40 mg total) by mouth 2 (two) times daily. 180 capsule 3  . sertraline (ZOLOFT) 50 MG tablet Take 1 tablet (50 mg total) by mouth at bedtime. 90 tablet 3  . SYMBICORT 160-4.5 MCG/ACT inhaler SMARTSIG:2 Puff(s) Via Inhaler Every Morning    . triamcinolone  (NASACORT ALLERGY 24HR) 55 MCG/ACT AERO nasal inhaler Place 1 spray into the nose daily as needed (congestion/allergies.). 1 Inhaler 11  . doxycycline (VIBRA-TABS) 100 MG tablet Take 1 tablet (100 mg total) by mouth daily. 30 tablet 1   No current facility-administered medications for this encounter.     REVIEW OF SYSTEMS:  A 15 point review of systems is documented in the electronic medical record. This was obtained by the nursing staff. However, I reviewed this with the patient to discuss relevant findings and make appropriate changes.  Pertinent items are noted in HPI.    PHYSICAL EXAM:  height is 5' 7" (1.702 m) and weight is 245 lb (111.1 kg).   ECOG = 0  0 - Asymptomatic (Fully active, able to carry on all predisease activities without restriction)  1 - Symptomatic but completely ambulatory (Restricted in physically strenuous activity but ambulatory and able to carry out work of a light or sedentary nature. For example, light housework, office work)  2 - Symptomatic, <50% in bed during the day (Ambulatory and capable of all self care but unable to carry out any work activities. Up and about more than 50% of waking hours)  3 - Symptomatic, >50% in bed, but not bedbound (Capable of only limited self-care, confined to bed or chair 50% or more of waking hours)  4 - Bedbound (Completely disabled. Cannot carry on any self-care. Totally confined to bed or chair)  5 - Death   Eustace Pen MM, Creech RH, Tormey DC, et al. [phone]). "Toxicity and response criteria of the [facility]". Narcissa Oncol. 5 (6): 649-55     LABORATORY DATA:  Lab Results  Component Value Date   WBC 6.4 11/26/2019   HGB 12.2 11/26/2019   HCT 36.7 11/26/2019   MCV 89.1 11/26/2019   PLT 268 11/26/2019   Lab Results  Component Value Date   NA 138 11/26/2019   K 4.6 11/26/2019   CL 105 11/26/2019   CO2 25 11/26/2019   Lab Results  Component Value Date   ALT 18 11/26/2019   AST 17  11/26/2019   ALKPHOS 107 11/26/2019   BILITOT 0.5 11/26/2019      RADIOGRAPHY: US BREAST LTD UNI RIGHT INC AXILLA  Result Date: 11/04/2019 CLINICAL DATA:  The patient was called back for right breast distortion and left breast calcifications. EXAM: DIGITAL DIAGNOSTIC BILATERAL MAMMOGRAM WITH TOMO ULTRASOUND RIGHT BREAST COMPARISON:  Previous exam(s). ACR Breast Density Category b: There are scattered areas of fibroglandular density. FINDINGS: There is a mass with associated distortion in the upper outer right breast. There is a group of calcifications in the inferior central left breast which are new compared to 2011. On physical exam, no suspicious lumps are identified. Targeted ultrasound is performed, showing a mass with associated distortion in the right breast at 11:30, 2 cm from the nipple measuring 8 x 6 by 9 mm. No axillary adenopathy. IMPRESSION: Highly suspicious mass in the right breast with associated distortion. Indeterminate calcifications in the inferior central left breast, new since 2011. RECOMMENDATION: Recommend ultrasound-guided biopsy of the right breast mass. Recommend stereotactic biopsy of the new left breast  calcifications. I have discussed the findings and recommendations with the patient. If applicable, a reminder letter will be sent to the patient regarding the next appointment. BI-RADS CATEGORY  5: Highly suggestive of malignancy. Electronically Signed   By: Dorise Bullion III M.D   On: 11/04/2019 16:03   MM DIAG BREAST TOMO BILATERAL  Result Date: 11/04/2019 CLINICAL DATA:  The patient was called back for right breast distortion and left breast calcifications. EXAM: DIGITAL DIAGNOSTIC BILATERAL MAMMOGRAM WITH TOMO ULTRASOUND RIGHT BREAST COMPARISON:  Previous exam(s). ACR Breast Density Category b: There are scattered areas of fibroglandular density. FINDINGS: There is a mass with associated distortion in the upper outer right breast. There is a group of calcifications in  the inferior central left breast which are new compared to 2011. On physical exam, no suspicious lumps are identified. Targeted ultrasound is performed, showing a mass with associated distortion in the right breast at 11:30, 2 cm from the nipple measuring 8 x 6 by 9 mm. No axillary adenopathy. IMPRESSION: Highly suspicious mass in the right breast with associated distortion. Indeterminate calcifications in the inferior central left breast, new since 2011. RECOMMENDATION: Recommend ultrasound-guided biopsy of the right breast mass. Recommend stereotactic biopsy of the new left breast calcifications. I have discussed the findings and recommendations with the patient. If applicable, a reminder letter will be sent to the patient regarding the next appointment. BI-RADS CATEGORY  5: Highly suggestive of malignancy. Electronically Signed   By: Dorise Bullion III M.D   On: 11/04/2019 16:03   MM CLIP PLACEMENT LEFT  Result Date: 11/11/2019 CLINICAL DATA:  Post stereotactic guided biopsy of calcifications in the lower inner left breast. EXAM: DIAGNOSTIC LEFT MAMMOGRAM POST STEREOTACTIC BIOPSY COMPARISON:  Previous exams. FINDINGS: Mammographic images were obtained following stereotactic guided biopsy of calcifications in the lower inner left breast. A coil shaped biopsy marking clip is present at the site of the biopsied calcifications in the lower inner left breast. A small to moderate post biopsy hematoma is present. IMPRESSION: Coil shaped biopsy marking clip at site of biopsied calcifications in the lower inner left breast. Final Assessment: Post Procedure Mammograms for Marker Placement Electronically Signed   By: Everlean Alstrom M.D.   On: 11/11/2019 08:14   MM CLIP PLACEMENT RIGHT  Result Date: 11/09/2019 CLINICAL DATA:  54 year old female presenting for biopsy of a right breast mass. EXAM: DIAGNOSTIC RIGHT MAMMOGRAM POST ULTRASOUND BIOPSY COMPARISON:  Previous exam(s). FINDINGS: Mammographic images were  obtained following ultrasound guided biopsy of a right breast mass at 11:30 o'clock. The biopsy marking clip is in expected position at the site of biopsy. IMPRESSION: Appropriate positioning of the coil shaped biopsy marking clip at the site of biopsy in the right breast at 11:30 o'clock. Final Assessment: Post Procedure Mammograms for Marker Placement Electronically Signed   By: Audie Pinto M.D.   On: 11/09/2019 10:14   MM LT BREAST BX W LOC DEV 1ST LESION IMAGE BX SPEC STEREO GUIDE  Addendum Date: 11/17/2019   ADDENDUM REPORT: 11/13/2019 09:01 ADDENDUM: Pathology revealed COMPLEX SCLEROSING LESION WITH USUAL DUCTAL HYPERPLASIA AND CALCIFICATIONS, FIBROCYSTIC CHANGES WITH USUAL DUCTAL HYPERPLASIA AND CALCIFICATIONS of the LEFT breast, lower inner quadrant. This was found to be concordant by Dr. Everlean Alstrom, with excision recommended. Pathology results were discussed with the patient by telephone. The patient reported doing well after the biopsy with tenderness at the site. Post biopsy instructions and care were reviewed and questions were answered. The patient was encouraged to call [facility] for any additional concerns. The patient has a recent diagnosis of RIGHT breast cancer and is scheduled to see Dr. Erroll Dunn at [facility] on November 17, 2019. Pathology results reported by Terie Purser, RN on 11/13/2019. Electronically Signed   By: Everlean Alstrom M.D.   On: 11/13/2019 09:01   Result Date: 11/17/2019 CLINICAL DATA:  54 year old female with indeterminate calcifications in the lower inner left breast. EXAM: LEFT BREAST STEREOTACTIC CORE NEEDLE BIOPSY COMPARISON:  Previous exams. FINDINGS: The patient and I discussed the procedure of stereotactic-guided biopsy including benefits and alternatives. We discussed the high likelihood of a successful procedure. We discussed the risks of the procedure including infection, bleeding, tissue injury, clip  migration, and inadequate sampling. Informed written consent was given. The usual time out protocol was performed immediately prior to the procedure. Using sterile technique and 1% Lidocaine as local anesthetic, under stereotactic guidance, a 9 gauge vacuum assisted device was used to perform core needle biopsy of the calcifications in the lower inner left breast using a medial to lateral approach. Specimen radiograph was performed showing the presence of calcifications. Specimens with calcifications are identified for pathology. Lesion quadrant: Lower inner At the conclusion of the procedure, a coil shaped tissue marker clip was deployed into the biopsy cavity. Follow-up 2-view mammogram was performed and dictated separately. IMPRESSION: Stereotactic-guided biopsy of the calcifications in the lower inner left breast. No apparent complications. Electronically Signed: By: Everlean Alstrom M.D. 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 This was found to be concordant by Dr. Audie Pinto. Pathology results were discussed with the patient by telephone. The patient reported doing well after the biopsy with tenderness at the site. Post biopsy instructions and care were reviewed and questions were answered. The patient was encouraged to call [facility] for any additional concerns. The patient is scheduled for additional stereotactic-guided biopsy for separate area of calcifications in the LEFT breast on 11/11/2019. Surgical consultation has been arranged with Dr. Erroll Dunn at [facility] on November 17, 2019. Pathology results reported by Stacie Acres RN on 11/10/2019. Electronically Signed   By: Audie Pinto M.D.   On: 11/10/2019 14:24   Result Date: 11/10/2019 CLINICAL  DATA:  54 year old female presenting for biopsy of a right breast mass. EXAM: ULTRASOUND GUIDED RIGHT BREAST CORE NEEDLE BIOPSY COMPARISON:  Previous exam(s). PROCEDURE: I met with the patient and we discussed the procedure of ultrasound-guided biopsy, including benefits and alternatives. We discussed the high likelihood of a successful procedure. We discussed the risks of the procedure, including infection, bleeding, tissue injury, clip migration, and inadequate sampling. Informed written consent was given. The usual time-out protocol was performed immediately prior to the procedure. Lesion quadrant: Upper outer quadrant Using sterile technique and 1% Lidocaine as local anesthetic, under direct ultrasound visualization, a 14 gauge spring-loaded device was used to perform biopsy of a right breast mass at 11:30 o'clock using a lateral approach. At the conclusion of the procedure a coil tissue marker clip was deployed into the biopsy cavity. Follow up 2 view mammogram was performed and dictated separately. IMPRESSION: Ultrasound guided biopsy of a right breast mass at 11:30 o'clock. No apparent complications. Electronically Signed: By: Audie Pinto M.D. On: 11/09/2019 10:15  IMPRESSION/ PLAN:  The patient underwent evaluation today for her diagnosis of breast cancer which is a new diagnosis.  The patient is seen preoperatively and is planning to likely proceed with a double lumpectomy involving both the right breast and the left breast.  Invasive ductal carcinoma [id number] present on the right and a likely benign tumor on the left but this will also be removed.  I discussed the recommendation to subsequently proceed with adjuvant radiation treatment at the appropriate time, up to a 6-1/2-week course of treatment.  We discussed the rationale of treatment, possible benefits as well as possible side effects, and the timing of this after surgery.  All of her questions were answered and I look forward to  seeing the patient postoperatively.      Due to the coronavirus pandemic, this encounter was provided by telemedicine platform MyChart through the video conference.  The patient has given verbal consent for this type of encounter and has been advised to only accept a meeting of this type in a secure network environment. The time spent during this encounter was 45 minutes, including medical records review, discussion of the patient's case with her, and coordination of care today. The attendants for this meeting include  Dr. Lisbeth Renshaw and the patient.  During the encounter,  Dr. Lisbeth Renshaw, and was located at [facility] Radiation Oncology Department.  The patient was located at home.    ________________________________   Jodelle Gross, MD, PhD   **Disclaimer: This note was dictated with voice recognition software. Similar sounding words can inadvertently be transcribed and this note may contain transcription errors which may not have been corrected upon publication of note.**

## 2019-12-02 ENCOUNTER — Ambulatory Visit
Admission: RE | Admit: 2019-12-02 | Discharge: 2019-12-02 | Disposition: A | Discharge status: Home / Self Care | Payer: BC Managed Care – PPO | Source: Ambulatory Visit | Attending: Surgery | Admitting: Surgery

## 2019-12-02 ENCOUNTER — Encounter: Payer: Self-pay | Admitting: Licensed Clinical Social Worker

## 2019-12-02 ENCOUNTER — Other Ambulatory Visit: Payer: Self-pay

## 2019-12-02 DIAGNOSIS — C50911 Malignant neoplasm of unspecified site of right female breast: Secondary | ICD-10-CM

## 2019-12-02 NOTE — Progress Notes (Signed)
[facility] [employer] representative contacted patient by phone  to offer support and assess for needs for newly diagnosed breast cancer. Patient reports that she is a little nervous with surgery prep today and surgery tomorrow, but is overall doing very well. She has been speaking with a friend who has stage IV cancer and feels that has been helpful for both her and the friend. She also has great support from her children (ages 22 and 18) and her husband of 25 years. He has been able to take some time off from work to be with her for appointments as well. No resource needs identified at this time.  CSW and patient discussed common feeling and emotions when being diagnosed with cancer, and the importance of support during treatment.  CSW informed patient of the support team and support services at [facility].  CSW provided contact information and encouraged patient to call with any questions or concerns.       Teresa Dunn, [place], [place] Worker [facility]

## 2019-12-03 ENCOUNTER — Ambulatory Visit
Admission: RE | Admit: 2019-12-03 | Discharge: 2019-12-03 | Disposition: A | Discharge status: Home / Self Care | Payer: BC Managed Care – PPO | Source: Ambulatory Visit | Attending: Surgery | Admitting: Surgery

## 2019-12-03 ENCOUNTER — Ambulatory Visit (HOSPITAL_BASED_OUTPATIENT_CLINIC_OR_DEPARTMENT_OTHER): Payer: BC Managed Care – PPO | Admitting: Certified Registered"

## 2019-12-03 ENCOUNTER — Encounter (HOSPITAL_BASED_OUTPATIENT_CLINIC_OR_DEPARTMENT_OTHER): Payer: Self-pay | Admitting: Surgery

## 2019-12-03 ENCOUNTER — Ambulatory Visit (HOSPITAL_BASED_OUTPATIENT_CLINIC_OR_DEPARTMENT_OTHER)
Admission: RE | Admit: 2019-12-03 | Discharge: 2019-12-03 | Disposition: A | Discharge status: Home / Self Care | Payer: BC Managed Care – PPO | Attending: Surgery | Admitting: Surgery

## 2019-12-03 ENCOUNTER — Other Ambulatory Visit: Payer: Self-pay

## 2019-12-03 ENCOUNTER — Encounter (HOSPITAL_BASED_OUTPATIENT_CLINIC_OR_DEPARTMENT_OTHER)
Admission: RE | Disposition: A | Discharge status: Home / Self Care | Payer: Self-pay | Source: Home / Self Care | Attending: Surgery

## 2019-12-03 ENCOUNTER — Encounter (HOSPITAL_COMMUNITY)
Admission: RE | Admit: 2019-12-03 | Discharge: 2019-12-03 | Disposition: A | Discharge status: Home / Self Care | Payer: BC Managed Care – PPO | Source: Ambulatory Visit | Attending: Surgery | Admitting: Surgery

## 2019-12-03 DIAGNOSIS — Z87891 Personal history of nicotine dependence: Secondary | ICD-10-CM | POA: Insufficient documentation

## 2019-12-03 DIAGNOSIS — Z7951 Long term (current) use of inhaled steroids: Secondary | ICD-10-CM | POA: Insufficient documentation

## 2019-12-03 DIAGNOSIS — J45909 Unspecified asthma, uncomplicated: Secondary | ICD-10-CM | POA: Insufficient documentation

## 2019-12-03 DIAGNOSIS — I10 Essential (primary) hypertension: Secondary | ICD-10-CM | POA: Insufficient documentation

## 2019-12-03 DIAGNOSIS — C50911 Malignant neoplasm of unspecified site of right female breast: Secondary | ICD-10-CM | POA: Diagnosis present

## 2019-12-03 DIAGNOSIS — K219 Gastro-esophageal reflux disease without esophagitis: Secondary | ICD-10-CM | POA: Insufficient documentation

## 2019-12-03 DIAGNOSIS — Z8541 Personal history of malignant neoplasm of cervix uteri: Secondary | ICD-10-CM | POA: Diagnosis not present

## 2019-12-03 DIAGNOSIS — F329 Major depressive disorder, single episode, unspecified: Secondary | ICD-10-CM | POA: Insufficient documentation

## 2019-12-03 DIAGNOSIS — F419 Anxiety disorder, unspecified: Secondary | ICD-10-CM | POA: Insufficient documentation

## 2019-12-03 DIAGNOSIS — C50411 Malignant neoplasm of upper-outer quadrant of right female breast: Secondary | ICD-10-CM | POA: Insufficient documentation

## 2019-12-03 DIAGNOSIS — Z79899 Other long term (current) drug therapy: Secondary | ICD-10-CM | POA: Insufficient documentation

## 2019-12-03 HISTORY — DX: Other specified postprocedural states: Z98.890

## 2019-12-03 HISTORY — PX: BREAST LUMPECTOMY WITH RADIOACTIVE SEED AND SENTINEL LYMPH NODE BIOPSY: SHX6550

## 2019-12-03 HISTORY — PX: BREAST EXCISIONAL BIOPSY: SUR124

## 2019-12-03 HISTORY — DX: Nausea with vomiting, unspecified: R11.2

## 2019-12-03 HISTORY — PX: BREAST LUMPECTOMY: SHX2

## 2019-12-03 HISTORY — DX: Personal history of other diseases of the circulatory system: Z86.79

## 2019-12-03 LAB — POCT PREGNANCY, URINE: Preg Test, Ur: NEGATIVE

## 2019-12-03 SURGERY — BREAST LUMPECTOMY WITH RADIOACTIVE SEED AND SENTINEL LYMPH NODE BIOPSY
Anesthesia: General | Site: Breast | Laterality: Bilateral

## 2019-12-03 MED ORDER — MIDAZOLAM HCL 2 MG/2ML IJ SOLN
INTRAMUSCULAR | Status: AC
  Filled 2019-12-03: qty 2

## 2019-12-03 MED ORDER — BUPIVACAINE HCL (PF) 0.25 % IJ SOLN
INTRAMUSCULAR | Status: AC
  Filled 2019-12-03: qty 30

## 2019-12-03 MED ORDER — CHLORHEXIDINE GLUCONATE CLOTH 2 % EX PADS: 6.0000 | MEDICATED_PAD | Freq: Once | CUTANEOUS | Status: DC

## 2019-12-03 MED ORDER — MIDAZOLAM HCL 2 MG/2ML IJ SOLN
2.0000 mg | Freq: Once | INTRAMUSCULAR | Status: AC
  Administered 2019-12-03: 2 mg via INTRAVENOUS

## 2019-12-03 MED ORDER — HYDROMORPHONE HCL 1 MG/ML IJ SOLN
0.2500 mg | INTRAMUSCULAR | Status: DC | PRN
  Administered 2019-12-03: 0.25 mg via INTRAVENOUS
  Administered 2019-12-03: 0.5 mg via INTRAVENOUS

## 2019-12-03 MED ORDER — FENTANYL CITRATE (PF) 100 MCG/2ML IJ SOLN
100.0000 ug | Freq: Once | INTRAMUSCULAR | Status: AC
  Administered 2019-12-03: 100 ug via INTRAVENOUS

## 2019-12-03 MED ORDER — ONDANSETRON HCL 4 MG/2ML IJ SOLN
INTRAMUSCULAR | Status: AC
  Filled 2019-12-03: qty 2

## 2019-12-03 MED ORDER — GABAPENTIN 300 MG PO CAPS
300.0000 mg | ORAL_CAPSULE | ORAL | Status: AC
  Administered 2019-12-03: 300 mg via ORAL

## 2019-12-03 MED ORDER — GABAPENTIN 300 MG PO CAPS
ORAL_CAPSULE | ORAL | Status: AC
  Filled 2019-12-03: qty 1

## 2019-12-03 MED ORDER — BUPIVACAINE-EPINEPHRINE (PF) 0.5% -1:200000 IJ SOLN
INTRAMUSCULAR | Status: DC | PRN
Start: 2019-12-03 — End: 2019-12-03
  Administered 2019-12-03: 30 mL

## 2019-12-03 MED ORDER — PROPOFOL 10 MG/ML IV BOLUS
INTRAVENOUS | Status: AC
  Filled 2019-12-03: qty 20

## 2019-12-03 MED ORDER — TRAMADOL HCL 50 MG PO TABS: 50.0000 mg | ORAL_TABLET | Freq: Four times a day (QID) | ORAL | 0 refills | Status: DC | PRN

## 2019-12-03 MED ORDER — PROPOFOL 10 MG/ML IV BOLUS
INTRAVENOUS | Status: DC | PRN
  Administered 2019-12-03: 150 mg via INTRAVENOUS
  Administered 2019-12-03: 50 mg via INTRAVENOUS

## 2019-12-03 MED ORDER — EPHEDRINE 5 MG/ML INJ
INTRAVENOUS | Status: AC
  Filled 2019-12-03: qty 10

## 2019-12-03 MED ORDER — BUPIVACAINE HCL (PF) 0.25 % IJ SOLN
INTRAMUSCULAR | Status: DC | PRN
  Administered 2019-12-03: 27 mL

## 2019-12-03 MED ORDER — FENTANYL CITRATE (PF) 100 MCG/2ML IJ SOLN
INTRAMUSCULAR | Status: DC | PRN
  Administered 2019-12-03: 100 ug via INTRAVENOUS
  Administered 2019-12-03 (×2): 25 ug via INTRAVENOUS

## 2019-12-03 MED ORDER — ONDANSETRON HCL 4 MG/2ML IJ SOLN
INTRAMUSCULAR | Status: DC | PRN
  Administered 2019-12-03: 4 mg via INTRAVENOUS

## 2019-12-03 MED ORDER — LIDOCAINE 2% (20 MG/ML) 5 ML SYRINGE
INTRAMUSCULAR | Status: AC
  Filled 2019-12-03: qty 5

## 2019-12-03 MED ORDER — DEXAMETHASONE SODIUM PHOSPHATE 10 MG/ML IJ SOLN
INTRAMUSCULAR | Status: AC
  Filled 2019-12-03: qty 1

## 2019-12-03 MED ORDER — LIDOCAINE HCL (CARDIAC) PF 100 MG/5ML IV SOSY
PREFILLED_SYRINGE | INTRAVENOUS | Status: DC | PRN
  Administered 2019-12-03: 100 mg via INTRAVENOUS

## 2019-12-03 MED ORDER — HYDROMORPHONE HCL 1 MG/ML IJ SOLN
INTRAMUSCULAR | Status: AC
  Filled 2019-12-03: qty 0.5

## 2019-12-03 MED ORDER — LACTATED RINGERS IV SOLN: INTRAVENOUS | Status: DC

## 2019-12-03 MED ORDER — FENTANYL CITRATE (PF) 100 MCG/2ML IJ SOLN
INTRAMUSCULAR | Status: AC
  Filled 2019-12-03: qty 2

## 2019-12-03 MED ORDER — GLYCOPYRROLATE PF 0.2 MG/ML IJ SOSY
PREFILLED_SYRINGE | INTRAMUSCULAR | Status: AC
  Filled 2019-12-03: qty 1

## 2019-12-03 MED ORDER — SODIUM CHLORIDE (PF) 0.9 % IJ SOLN
INTRAMUSCULAR | Status: AC
  Filled 2019-12-03: qty 10

## 2019-12-03 MED ORDER — METHYLENE BLUE 0.5 % INJ SOLN
INTRAVENOUS | Status: AC
  Filled 2019-12-03: qty 10

## 2019-12-03 MED ORDER — DEXAMETHASONE SODIUM PHOSPHATE 4 MG/ML IJ SOLN
INTRAMUSCULAR | Status: DC | PRN
  Administered 2019-12-03: 10 mg via INTRAVENOUS

## 2019-12-03 MED ORDER — MEPERIDINE HCL 25 MG/ML IJ SOLN: 6.2500 mg | INTRAMUSCULAR | Status: DC | PRN

## 2019-12-03 MED ORDER — EPHEDRINE SULFATE 50 MG/ML IJ SOLN
INTRAMUSCULAR | Status: DC | PRN
Start: 2019-12-03 — End: 2019-12-03
  Administered 2019-12-03 (×3): 10 mg via INTRAVENOUS

## 2019-12-03 MED ORDER — CEFAZOLIN SODIUM-DEXTROSE 2-4 GM/100ML-% IV SOLN
INTRAVENOUS | Status: AC
  Filled 2019-12-03: qty 100

## 2019-12-03 MED ORDER — TECHNETIUM TC 99M SULFUR COLLOID FILTERED
1.0000 | Freq: Once | INTRAVENOUS | Status: AC | PRN
  Administered 2019-12-03: 1 via INTRADERMAL

## 2019-12-03 MED ORDER — ONDANSETRON HCL 4 MG/2ML IJ SOLN: 4.0000 mg | Freq: Once | INTRAMUSCULAR | Status: DC | PRN

## 2019-12-03 MED ORDER — GLYCOPYRROLATE 0.2 MG/ML IJ SOLN
INTRAMUSCULAR | Status: DC | PRN
Start: 2019-12-03 — End: 2019-12-03
  Administered 2019-12-03: .1 mg via INTRAVENOUS

## 2019-12-03 MED ORDER — MIDAZOLAM HCL 5 MG/5ML IJ SOLN
INTRAMUSCULAR | Status: DC | PRN
  Administered 2019-12-03: 2 mg via INTRAVENOUS

## 2019-12-03 MED ORDER — CEFAZOLIN SODIUM-DEXTROSE 2-4 GM/100ML-% IV SOLN
2.0000 g | INTRAVENOUS | Status: AC
  Administered 2019-12-03: 2 g via INTRAVENOUS

## 2019-12-03 SURGICAL SUPPLY — 51 items
APPLIER CLIP 9.375 MED OPEN (MISCELLANEOUS) ×2
BINDER BREAST LRG (GAUZE/BANDAGES/DRESSINGS) IMPLANT
BINDER BREAST MEDIUM (GAUZE/BANDAGES/DRESSINGS) IMPLANT
BINDER BREAST XLRG (GAUZE/BANDAGES/DRESSINGS) IMPLANT
BINDER BREAST XXLRG (GAUZE/BANDAGES/DRESSINGS) ×2 IMPLANT
BLADE SURG 15 STRL LF DISP TIS (BLADE) ×2 IMPLANT
BLADE SURG 15 STRL SS (BLADE) ×4
CANISTER SUC SOCK COL 7IN (MISCELLANEOUS) IMPLANT
CANISTER SUCT 1200ML W/VALVE (MISCELLANEOUS) ×2 IMPLANT
CHLORAPREP W/TINT 26 (MISCELLANEOUS) ×4 IMPLANT
CLIP APPLIE 9.375 MED OPEN (MISCELLANEOUS) ×1 IMPLANT
COVER BACK TABLE 60X90IN (DRAPES) ×2 IMPLANT
COVER MAYO STAND STRL (DRAPES) ×2 IMPLANT
COVER PROBE W GEL 5X96 (DRAPES) ×2 IMPLANT
COVER WAND RF STERILE (DRAPES) IMPLANT
DECANTER SPIKE VIAL GLASS SM (MISCELLANEOUS) IMPLANT
DERMABOND ADVANCED (GAUZE/BANDAGES/DRESSINGS) ×2
DERMABOND ADVANCED .7 DNX12 (GAUZE/BANDAGES/DRESSINGS) ×2 IMPLANT
DRAPE LAPAROSCOPIC ABDOMINAL (DRAPES) ×2 IMPLANT
DRAPE UTILITY XL STRL (DRAPES) ×4 IMPLANT
ELECT COATED BLADE 2.86 ST (ELECTRODE) ×2 IMPLANT
ELECT REM PT RETURN 9FT ADLT (ELECTROSURGICAL) ×2
ELECTRODE REM PT RTRN 9FT ADLT (ELECTROSURGICAL) ×1 IMPLANT
GLOVE BIOGEL PI IND STRL 6.5 (GLOVE) ×1 IMPLANT
GLOVE BIOGEL PI IND STRL 7.0 (GLOVE) ×1 IMPLANT
GLOVE BIOGEL PI IND STRL 8 (GLOVE) ×1 IMPLANT
GLOVE BIOGEL PI INDICATOR 6.5 (GLOVE) ×1
GLOVE BIOGEL PI INDICATOR 7.0 (GLOVE) ×1
GLOVE BIOGEL PI INDICATOR 8 (GLOVE) ×1
GLOVE ECLIPSE 6.5 STRL STRAW (GLOVE) ×2 IMPLANT
GLOVE ECLIPSE 8.0 STRL XLNG CF (GLOVE) ×4 IMPLANT
GOWN STRL REUS W/ TWL LRG LVL3 (GOWN DISPOSABLE) ×2 IMPLANT
GOWN STRL REUS W/TWL LRG LVL3 (GOWN DISPOSABLE) ×4
HEMOSTAT ARISTA ABSORB 3G PWDR (HEMOSTASIS) IMPLANT
HEMOSTAT SNOW SURGICEL 2X4 (HEMOSTASIS) IMPLANT
KIT MARKER MARGIN INK (KITS) ×2 IMPLANT
NDL SAFETY ECLIPSE 18X1.5 (NEEDLE) IMPLANT
NEEDLE HYPO 18GX1.5 SHARP (NEEDLE)
NEEDLE HYPO 25X1 1.5 SAFETY (NEEDLE) ×2 IMPLANT
NS IRRIG 1000ML POUR BTL (IV SOLUTION) ×2 IMPLANT
PENCIL SMOKE EVACUATOR (MISCELLANEOUS) ×2 IMPLANT
SET BASIN DAY SURGERY F.S. (CUSTOM PROCEDURE TRAY) ×2 IMPLANT
SLEEVE SCD COMPRESS KNEE MED (MISCELLANEOUS) ×2 IMPLANT
SPONGE LAP 4X18 RFD (DISPOSABLE) ×4 IMPLANT
SUT MNCRL AB 4-0 PS2 18 (SUTURE) ×4 IMPLANT
SUT VICRYL 3-0 CR8 SH (SUTURE) ×4 IMPLANT
SYR CONTROL 10ML LL (SYRINGE) ×2 IMPLANT
TOWEL GREEN STERILE FF (TOWEL DISPOSABLE) ×2 IMPLANT
TRAY FAXITRON CT DISP (TRAY / TRAY PROCEDURE) ×4 IMPLANT
TUBE CONNECTING 20X1/4 (TUBING) ×2 IMPLANT
YANKAUER SUCT BULB TIP NO VENT (SUCTIONS) ×2 IMPLANT

## 2019-12-03 NOTE — Anesthesia Preprocedure Evaluation (Signed)
Anesthesia Evaluation  Patient identified by MRN, date of birth, ID band Patient awake    Reviewed: Allergy & Precautions, NPO status , Patient's Chart, lab work & pertinent test results  History of Anesthesia Complications (+) PONV  Airway Mallampati: II  TM Distance: >3 FB Neck ROM: Full    Dental   Pulmonary former smoker,    Pulmonary exam normal        Cardiovascular hypertension, Normal cardiovascular exam     Neuro/Psych Anxiety Depression    GI/Hepatic GERD  Medicated and Controlled,  Endo/Other    Renal/GU      Musculoskeletal   Abdominal   Peds  Hematology   Anesthesia Other Findings   Reproductive/Obstetrics                             Anesthesia Physical Anesthesia Plan  ASA: III  Anesthesia Plan: General   Post-op Pain Management:  Regional for Post-op pain   Induction:   PONV Risk Score and Plan: 4 or greater and Ondansetron, Midazolam, Dexamethasone and Treatment may vary due to age or medical condition  Airway Management Planned: LMA  Additional Equipment:   Intra-op Plan:   Post-operative Plan: Extubation in OR  Informed Consent: I have reviewed the patients History and Physical, chart, labs and discussed the procedure including the risks, benefits and alternatives for the proposed anesthesia with the patient or authorized representative who has indicated his/her understanding and acceptance.       Plan Discussed with: CRNA and Surgeon  Anesthesia Plan Comments:         Anesthesia Quick Evaluation

## 2019-12-03 NOTE — Op Note (Signed)
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  Postoperative diagnosis: Same  Procedure: Bilateral seed localized lumpectomy with right axillary sentinel lymph node mapping  Surgeon: Erroll Luna, MD  Anesthesia: LMA with pectoral block on the right and local consisting of 0.25% Marcaine plain  EBL: Minimal  Specimen: Left breast tissue with seed clip verified by Faxitron, right breast tissue with seed and clip verified by Faxitron and 1 right axillary sentinel node level 1 hot  Drains: None  EBL: Minimal  IV fluids: Per anesthesia record  Indications for procedure: The patient is a 54 year old female found to have a stage I right breast cancer in a complex sclerosing lesion of the left.  She opted for bilateral lumpectomies with right axillary sentinel lymph node mapping.The procedure has been discussed with the patient. Alternatives to surgery have been discussed with the patient.  Risks of surgery include bleeding,  Infection,  Seroma formation, death,  and the need for further surgery.   The patient understands and wishes to proceed.Sentinel lymph node mapping and dissection has been discussed with the patient.  Risk of bleeding,  Infection,  Seroma formation,  Additional procedures,,  Shoulder weakness ,  Shoulder stiffness, lymphedema nerve and blood vessel injury and reaction to the mapping dyes have been discussed.  Alternatives to surgery have been discussed with the patient.  The patient agrees to proceed.   Description of procedure: The patient was met in the holding area and questions were answered.  Neoprobe used to identify seed in the left central breast as well as right breast upper outer quadrant.  She underwent pectoral block on the right per anesthesia and injection of technetium sulfur colloid per radiology protocol.  All questions were answered.  She was taken back the operating.  She is placed supine  upon the OR table.  After adequate level general esthesia both breasts are prepped draped sterile fashion timeout performed.  Left side was done first.  Neoprobe used to identify the seed left breast central.  Curvilinear incision made along the inferior border of the nipple were complex and dissection was carried down into the breast tissue.  All tissue around the seed clip were excised with a grossly negative margin.  Faxitron revealed the seed and clip to be in the specimen.  This was inked and sent to pathology.  Wound closed with 3-0 Vicryl and 4 Monocryl.  The right side was then done.  Neoprobe used identify the seed right breast upper outer quadrant.  Curvilinear incision made around the superior border of the right nipple areolar complex.  Dissection was carried down all tissue and the seed and clip were excised with a grossly negative margin.  Hemostasis achieved.  Clips were placed in the cavity.  The specimen was oriented and Faxitron revealed both seed and clip to be in the specimen with grossly negative margins.  The wound was then closed with 3-0 Vicryl for Monocryl.  The technetium settings were set on the neoprobe.  Hotspot identified in the right axilla.  A 4 cm incision was made along the inferior hairline in the right axilla.  Dissection was carried down and a level 1 deep node was identified and removed a sentinel node.  Background counts approached 0 hemostasis achieved.  Local anesthetic was infiltrated in all 3 wounds.  The right axillary wound was closed with 3-0 Vicryl for Monocryl.  Dermabond applied.  All counts found to be correct.  Breast binder  placed.  The patient was awoke extubated taken recovery in satisfactory condition.

## 2019-12-03 NOTE — Anesthesia Procedure Notes (Signed)
Anesthesia Regional Block: Pectoralis block  Laterality: Right     Needles:  Injection technique: Single-shot     Needle Length: 9cm  Needle Gauge: 21     Additional Needles:   Procedures:,,,, ultrasound used (permanent image in chart),,,,  Narrative:  Start time: 12/03/2019 7:00 AM End time: 12/03/2019 7:10 AM Injection made incrementally with aspirations every 5 mL.  Performed by: Personally  Anesthesiologist: Lillia Abed, MD

## 2019-12-03 NOTE — Anesthesia Postprocedure Evaluation (Signed)
Anesthesia Post Note  Patient: Sibley  Procedure(s) Performed: BILATERAL BREAST LUMPECTOMY WITH RADIOACTIVE SEED AND RIGHT SENTINEL LYMPH NODE MAPPING (Bilateral Breast)     Patient location during evaluation: PACU Anesthesia Type: General Level of consciousness: awake and alert Pain management: pain level controlled Vital Signs Assessment: post-procedure vital signs reviewed and stable Respiratory status: spontaneous breathing, nonlabored ventilation, respiratory function stable and patient connected to nasal cannula oxygen Cardiovascular status: blood pressure returned to baseline and stable Postop Assessment: no apparent nausea or vomiting Anesthetic complications: no   No complications documented.  Last Vitals:  Vitals:   12/03/19 1000 12/03/19 1009  BP: 131/67 (!) 143/75  Pulse: 71 78  Resp: 16 16  Temp:  36.7 C  SpO2: 94% 97%    Last Pain:  Vitals:   12/03/19 1009  PainSc: 3                  Emmalin Jaquess DAVID

## 2019-12-03 NOTE — Interval H&P Note (Signed)
History and Physical Interval Note:  12/03/2019 7:09 AM  Teresa Dunn Teresa Dunn  has presented today for surgery, with the diagnosis of RIGHT BREAST CANCER, LEFT RADIAL SCAR.  The various methods of treatment have been discussed with the patient and family. After consideration of risks, benefits and other options for treatment, the patient has consented to  Procedure(s) with comments: BILATERAL BREAST LUMPECTOMY WITH RADIOACTIVE SEED AND RIGHT SENTINEL LYMPH NODE MAPPING (Bilateral) - PEC BLOCK as a surgical intervention.  The patient's history has been reviewed, patient examined, no change in status, stable for surgery.  I have reviewed the patient's chart and labs.  Questions were answered to the patient's satisfaction.     [place]ley

## 2019-12-03 NOTE — Anesthesia Procedure Notes (Signed)
Procedure Name: LMA Insertion Date/Time: 12/03/2019 8:10 AM Performed by: Marrianne Mood, CRNA Pre-anesthesia Checklist: Patient identified, Emergency Drugs available, Suction available, Patient being monitored and Timeout performed Patient Re-evaluated:Patient Re-evaluated prior to induction Oxygen Delivery Method: Circle system utilized Preoxygenation: Pre-oxygenation with 100% oxygen Induction Type: IV induction Ventilation: Mask ventilation without difficulty LMA: LMA inserted LMA Size: 4.0 Number of attempts: 1 Airway Equipment and Method: Bite block Placement Confirmation: positive ETCO2 Tube secured with: Tape Dental Injury: Teeth and Oropharynx as per pre-operative assessment

## 2019-12-03 NOTE — Transfer of Care (Signed)
Immediate Anesthesia Transfer of Care Note  Patient: Teresa Dunn  Procedure(s) Performed: BILATERAL BREAST LUMPECTOMY WITH RADIOACTIVE SEED AND RIGHT SENTINEL LYMPH NODE MAPPING (Bilateral Breast)  Patient Location: PACU  Anesthesia Type:GA combined with regional for post-op pain  Level of Consciousness: awake and patient cooperative  Airway & Oxygen Therapy: Patient Spontanous Breathing and Patient connected to face mask oxygen  Post-op Assessment: Report given to RN and Post -op Vital signs reviewed and stable  Post vital signs: Reviewed and stable  Last Vitals:  Vitals Value Taken Time  BP 151/78 12/03/19 0918  Temp    Pulse 89 12/03/19 0919  Resp 14 12/03/19 0919  SpO2 99 % 12/03/19 0919  Vitals shown include unvalidated device data.  Last Pain:  Vitals:   12/03/19 0641  PainSc: 0-No pain         Complications: No complications documented.

## 2019-12-03 NOTE — Progress Notes (Signed)
Assisted Dr. Ossey with right, ultrasound guided, pectoralis block. Side rails up, monitors on throughout procedure. See vital signs in flow sheet. Tolerated Procedure well. 

## 2019-12-03 NOTE — Discharge Instructions (Signed)
[place] Office Phone Number [phone]  BREAST BIOPSY/ PARTIAL MASTECTOMY: POST OP INSTRUCTIONS  Always review your discharge instruction sheet given to you by the facility where your surgery was performed.  IF YOU HAVE DISABILITY OR FAMILY LEAVE FORMS, YOU MUST BRING THEM TO THE OFFICE FOR PROCESSING.  DO NOT GIVE THEM TO YOUR DOCTOR.  1. A prescription for pain medication may be given to you upon discharge.  Take your pain medication as prescribed, if needed.  If narcotic pain medicine is not needed, then you may take acetaminophen (Tylenol) or ibuprofen (Advil) as needed. 2. Take your usually prescribed medications unless otherwise directed 3. If you need a refill on your pain medication, please contact your pharmacy.  They will contact our office to request authorization.  Prescriptions will not be filled after 5pm or on week-ends. 4. You should eat very light the first 24 hours after surgery, such as soup, crackers, pudding, etc.  Resume your normal diet the day after surgery. 5. Most patients will experience some swelling and bruising in the breast.  Ice packs and a good support bra will help.  Swelling and bruising can take several days to resolve.  6. It is common to experience some constipation if taking pain medication after surgery.  Increasing fluid intake and taking a stool softener will usually help or prevent this problem from occurring.  A mild laxative (Milk of Magnesia or Miralax) should be taken according to package directions if there are no bowel movements after 48 hours. 7. Unless discharge instructions indicate otherwise, you may remove your bandages 24-48 hours after surgery, and you may shower at that time.  You may have steri-strips (small skin tapes) in place directly over the incision.  These strips should be left on the skin for 7-10 days.  If your surgeon used skin glue on the incision, you may shower in 24 hours.  The glue will flake off over the  next 2-3 weeks.  Any sutures or staples will be removed at the office during your follow-up visit. 8. ACTIVITIES:  You may resume regular daily activities (gradually increasing) beginning the next day.  Wearing a good support bra or sports bra minimizes pain and swelling.  You may have sexual intercourse when it is comfortable. a. You may drive when you no longer are taking prescription pain medication, you can comfortably wear a seatbelt, and you can safely maneuver your car and apply brakes. b. RETURN TO WORK:  ______________________________________________________________________________________ 9. You should see your doctor in the office for a follow-up appointment approximately two weeks after your surgery.  Your doctor's nurse will typically make your follow-up appointment when she calls you with your pathology report.  Expect your pathology report 2-3 business days after your surgery.  You may call to check if you do not hear from [place] after three days. 10. OTHER INSTRUCTIONS: _______________________________________________________________________________________________ _____________________________________________________________________________________________________________________________________ _____________________________________________________________________________________________________________________________________ _____________________________________________________________________________________________________________________________________  WHEN TO CALL YOUR DOCTOR: 1. Fever over 101.0 2. Nausea and/or vomiting. 3. Extreme swelling or bruising. 4. Continued bleeding from incision. 5. Increased pain, redness, or drainage from the incision.  The clinic staff is available to answer your questions during regular business hours.  Please don't hesitate to call and ask to speak to one of the nurses for clinical concerns.  If you have a medical emergency, go to the nearest  emergency room or call 911.  A surgeon from [facility] is always on call at the hospital.  For further questions, please visit [email or web address]  Post Anesthesia Home Care Instructions ? ?Activity: ?Get plenty of rest for the remainder of the day. A responsible individual must stay with you for 24 hours following the procedure.  ?For the next 24 hours, DO NOT: ?-Drive a car ?-Operate machinery ?-Drink alcoholic beverages ?-Take any medication unless instructed by your physician ?-Make any legal decisions or sign important papers. ? ?Meals: ?Start with liquid foods such as gelatin or soup. Progress to regular foods as tolerated. Avoid greasy, spicy, heavy foods. If nausea and/or vomiting occur, drink only clear liquids until the nausea and/or vomiting subsides. Call your physician if vomiting continues. ? ?Special Instructions/Symptoms: ?Your throat may feel dry or sore from the anesthesia or the breathing tube placed in your throat during surgery. If this causes discomfort, gargle with warm salt water. The discomfort should disappear within 24 hours. ? ? ?    ?

## 2019-12-03 NOTE — Progress Notes (Signed)
Nuc med injections completed. Patient tolerated well.   

## 2019-12-04 ENCOUNTER — Encounter (HOSPITAL_BASED_OUTPATIENT_CLINIC_OR_DEPARTMENT_OTHER): Payer: Self-pay | Admitting: Surgery

## 2019-12-07 LAB — SURGICAL PATHOLOGY

## 2019-12-09 ENCOUNTER — Encounter: Payer: Self-pay | Admitting: *Deleted

## 2019-12-09 ENCOUNTER — Telehealth: Payer: Self-pay | Admitting: *Deleted

## 2019-12-09 NOTE — Telephone Encounter (Signed)
Received order for oncotype testing. Requisition faxed to pathology and [facility].   Called pt and discussed testing. Denies further needs or questions at this time.

## 2019-12-11 ENCOUNTER — Ambulatory Visit: Payer: BC Managed Care – PPO | Admitting: Family Medicine

## 2019-12-15 ENCOUNTER — Encounter: Payer: Self-pay | Admitting: *Deleted

## 2019-12-15 ENCOUNTER — Telehealth: Payer: Self-pay | Admitting: *Deleted

## 2019-12-15 NOTE — Telephone Encounter (Signed)
Received onctoype results of 14/4%.  Patient is aware. She already has an appointment with Dr. Lisbeth Renshaw and [facility] 7/14.

## 2019-12-23 ENCOUNTER — Inpatient Hospital Stay: Payer: BC Managed Care – PPO

## 2019-12-23 ENCOUNTER — Other Ambulatory Visit: Payer: Self-pay

## 2019-12-23 ENCOUNTER — Encounter: Payer: Self-pay | Admitting: Genetic Counselor

## 2019-12-23 ENCOUNTER — Inpatient Hospital Stay: Payer: BC Managed Care – PPO | Attending: Oncology | Admitting: Genetic Counselor

## 2019-12-23 DIAGNOSIS — Z170 Estrogen receptor positive status [ER+]: Secondary | ICD-10-CM

## 2019-12-23 DIAGNOSIS — Z803 Family history of malignant neoplasm of breast: Secondary | ICD-10-CM

## 2019-12-23 DIAGNOSIS — C50411 Malignant neoplasm of upper-outer quadrant of right female breast: Secondary | ICD-10-CM

## 2019-12-23 NOTE — Progress Notes (Signed)
REFERRING PROVIDER: Chauncey Cruel, MD [address] [place],   [id number]  PRIMARY PROVIDER:  Leamon Arnt, MD  PRIMARY REASON FOR VISIT:  1. Malignant neoplasm of upper-outer quadrant of right breast in female, estrogen receptor positive ([place])   2. Family history of breast cancer      HISTORY OF PRESENT ILLNESS:   Teresa Dunn, a 54 y.o. female, was seen for a [facility] cancer genetics consultation at the request of Dr. Jana Hakim due to a personal and family history of breast cancer.  Teresa Dunn presents to clinic today to discuss the possibility of a hereditary predisposition to cancer, genetic testing, and to further clarify her future cancer risks, as well as potential cancer risks for family members.   In June 2021, at the age of 34, Teresa Dunn was diagnosed with cancer of the right breast. The treatment plan lumpectomy and radiation.    CANCER HISTORY:  Oncology History  Malignant neoplasm of upper-outer quadrant of right breast in female, estrogen receptor positive ([place])  11/18/2019 Initial Diagnosis   Malignant neoplasm of upper-outer quadrant of right breast in female, estrogen receptor positive ([place])   11/18/2019 Cancer Staging   Staging form: Breast, AJCC 8th Edition - Clinical: Stage IA (cT1b, cN0, cM0, G1, ER+, PR+, HER2-) - Signed by Gardenia Phlegm, NP on 11/18/2019      RISK FACTORS:  Menarche was at age 59.  First live birth at age 22.  OCP use for approximately 12 years.  Ovaries intact: yes.  Hysterectomy: no.  Menopausal status: perimenopausal.  HRT use: 0 years. Colonoscopy: no; normal cologuard. Mammogram within the last year: yes. Number of breast biopsies: 1. Up to date with pelvic exams: yes. Any excessive radiation exposure in the past: no  Past Medical History:  Diagnosis Date  . Anxiety   . Arthritis   . Asthma   . Cervical dysplasia 1990   s/p cryotherapy with normal f/u paps  . Depression   . Family history of  breast cancer   . 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 Quit 2016  . Genital warts   . GERD (gastroesophageal reflux disease)   . History of cardiac murmur    per pt, mild and no cardiologist  . Hypertension   . PONV (postoperative nausea and vomiting)   . Primary osteoarthritis of right knee 09/02/2019    Past Surgical History:  Procedure Laterality Date  . BREAST BIOPSY Right 11/09/2019  . BREAST LUMPECTOMY WITH RADIOACTIVE SEED AND SENTINEL LYMPH NODE BIOPSY Bilateral 12/03/2019   Procedure: BILATERAL BREAST LUMPECTOMY WITH RADIOACTIVE SEED AND RIGHT SENTINEL LYMPH NODE MAPPING;  Surgeon: Erroll Luna, MD;  Location: [facility];  Service: General;  Laterality: Bilateral;  PEC BLOCK  . [place]   with normal follow up paps  . KNEE SURGERY    . SHOULDER ARTHROSCOPY WITH SUBACROMIAL DECOMPRESSION Left 11/18/2014   Procedure: LEFT SHOULDER ARTHROSCOPY WITH SUBACROMIAL DECOMPRESSION/DEBRIDEMENT LABREAL/MINI DISTAL CLAVICLE RESECTION;  Surgeon: Susa Day, MD;  Location: [facility];  Service: Orthopedics;  Laterality: Left;  . TUBAL LIGATION      Social History   Socioeconomic History  . Marital status: Married    Spouse name: Jeneen Rinks  . Number of children: 2  . Years of education: Not on file  . Highest education level: Not on file  Occupational History  . Occupation: Medical laboratory scientific officer HS    Employer: [employer]  .  Occupation: Museum/gallery curator: [employer]  Tobacco Use  . Smoking status: Former Smoker    Packs/day: 1.50    Years: 30.00    Pack years: 45.00    Types: Cigarettes    Quit date: 04/16/2014    Years since quitting: 5.6  . Smokeless tobacco: Never Used  Vaping Use  . Vaping Use: Never used  Substance and Sexual Activity  . Alcohol use: Yes    Comment: socially   . Drug use: No    Comment: used marijuana in past as a teenager  . Sexual  activity: Yes    Birth control/protection: Surgical  Other Topics Concern  . Not on file  Social History Narrative  . Not on file   Social Determinants of Health   Financial Resource Strain:   . Difficulty of Paying Living Expenses:   Food Insecurity:   . Worried About Charity fundraiser in the Last Year:   . Arboriculturist in the Last Year:   Transportation Needs:   . Film/video editor (Medical):   Marland Kitchen Lack of Transportation (Non-Medical):   Physical Activity:   . Days of Exercise per Week:   . Minutes of Exercise per Session:   Stress:   . Feeling of Stress :   Social Connections:   . Frequency of Communication with Friends and Family:   . Frequency of Social Gatherings with Friends and Family:   . Attends Religious Services:   . Active Member of Clubs or Organizations:   . Attends Archivist Meetings:   Marland Kitchen Marital Status:      FAMILY HISTORY:  We obtained a detailed, 4-generation family history.  Significant diagnoses are listed below: Family History  Problem Relation Age of Onset  . Heart disease Mother   . Hypertension Mother   . Diabetes Mother   . Lung cancer Mother   . Bipolar disorder Mother   . Alcohol abuse Mother   . Heart disease Father   . Hypertension Father   . Diabetes Father   . Asthma Father   . Stroke Maternal Grandmother   . Breast cancer Maternal Grandmother   . ADD / ADHD Son   . Healthy Son   . Skin cancer Sister   . Cervical cancer Sister   . Healthy Brother   . Breast cancer Paternal Grandmother   . Cancer Maternal Aunt        unknown   . Lung cancer Maternal Grandfather   . Pneumonia Paternal Uncle   . Colon cancer Neg Hx     The patient has a son and daughter who are cancer free.  She has a full brother who is cancer free and a maternal half sister who has skin cancer and a history of cervical cancer.  The patient's mother is deceased and her father is living.  The patient's mother was a smoker and died of lung  cancer.  She had two sisters and two brothers.  One sister had an unknown cancer.  Both brothers died young, one was cognitively impaired.  The maternal grandmother had breast cancer in her 31's and the grandfather was a heavy smoker and died of lung cancer.  The patient's father has diabetes and heart disease.  He has two brothers, one who died of pneumonia.  The paternal grandmother had breast cancer and died in her 63's.  The grandfather died from unknown causes.  Teresa Dunn is unaware of previous  family history of genetic testing for hereditary cancer risks. Patient's maternal ancestors are of [place] descent, and paternal ancestors are of [place] descent. There is no reported Ashkenazi Jewish ancestry. There is no known consanguinity.  GENETIC COUNSELING ASSESSMENT: Teresa Dunn is a 54 y.o. female with a personal and family history of breast cancer which is somewhat suggestive of a familial, but not hereditary, predisposition to cancer given the ages of onset of individuals affected and the number of people affected. We, therefore, discussed and recommended the following at today's visit.   DISCUSSION: We discussed that 5 - 10% of breast is hereditary, with most cases associated with BRCA mutations.  There are other genes that can be associated with hereditary breast cancer syndromes.  We discussed that testing is beneficial for several reasons including knowing how to follow individuals after completing their treatment,and understand if other family members could be at risk for cancer and allow them to undergo genetic testing.   We reviewed the characteristics, features and inheritance patterns of hereditary cancer syndromes, including ages of onset, related cancers in the family, and the number of people affected in the family.  We also discussed genetic testing, including the appropriate family members to test, the process of testing, insurance coverage and turn-around-time for results. We discussed the  implications of a negative, positive and/or variant of uncertain significant result.   We discussed with Teresa Dunn that the personal and family history does not meet insurance or NCCN criteria for genetic testing and, therefore, is not highly consistent with a hereditary cancer syndrome.  We feel she is at low risk to harbor a gene mutation associated with such a condition. Thus, we did not recommend any genetic testing, at this time, and recommended Teresa Dunn continue to follow the cancer screening guidelines given by her primary healthcare provider.  PLAN: Teresa Dunn did not wish to pursue genetic testing at today's visit. We understand this decision and remain available to coordinate genetic testing at any time in the future. She will talk with her family to learn if there are others in the family with cancer that she was unaware of, and she will let me know if she finds any additional information.  We, therefore, recommend Teresa Dunn continue to follow the cancer screening guidelines given by her primary healthcare provider.  Lastly, we encouraged Teresa Dunn to remain in contact with cancer genetics annually so that we can continuously update the family history and inform her of any changes in cancer genetics and testing that may be of benefit for this family.   Teresa Dunn questions were answered to her satisfaction today. Our contact information was provided should additional questions or concerns arise. Thank you for the referral and allowing [place] to share in the care of your patient.   Teresa Dunn, Duck, [facility] Licensed, Insurance risk surveyor [email or web address] phone: [phone]  The patient was seen for a total of 35 minutes in face-to-face genetic counseling.  This patient was discussed with Drs. Magrinat, Lindi Adie and/or Burr Medico who agrees with the above.    _______________________________________________________________________ For Office Staff:  Number of people involved in  session: 1 Was an Intern/ student involved with case: yes; Salena Saner

## 2019-12-29 ENCOUNTER — Ambulatory Visit: Payer: Self-pay | Admitting: Surgery

## 2019-12-30 ENCOUNTER — Other Ambulatory Visit: Payer: Self-pay

## 2019-12-30 ENCOUNTER — Ambulatory Visit
Admission: RE | Admit: 2019-12-30 | Discharge: 2019-12-30 | Disposition: A | Discharge status: Home / Self Care | Payer: BC Managed Care – PPO | Source: Ambulatory Visit | Attending: Radiation Oncology | Admitting: Radiation Oncology

## 2019-12-30 ENCOUNTER — Encounter: Payer: Self-pay | Admitting: Radiation Oncology

## 2019-12-30 ENCOUNTER — Encounter: Payer: Self-pay | Admitting: Oncology

## 2019-12-30 VITALS — BP 149/80 | HR 67 | Temp 98.5°F | Resp 20 | Ht 67.0 in | Wt 250.4 lb

## 2019-12-30 DIAGNOSIS — Z809 Family history of malignant neoplasm, unspecified: Secondary | ICD-10-CM | POA: Diagnosis not present

## 2019-12-30 DIAGNOSIS — Z510 Encounter for antineoplastic radiation therapy: Secondary | ICD-10-CM | POA: Diagnosis not present

## 2019-12-30 DIAGNOSIS — C221 Intrahepatic bile duct carcinoma: Secondary | ICD-10-CM | POA: Insufficient documentation

## 2019-12-30 DIAGNOSIS — F418 Other specified anxiety disorders: Secondary | ICD-10-CM | POA: Insufficient documentation

## 2019-12-30 DIAGNOSIS — M1711 Unilateral primary osteoarthritis, right knee: Secondary | ICD-10-CM | POA: Diagnosis not present

## 2019-12-30 DIAGNOSIS — K219 Gastro-esophageal reflux disease without esophagitis: Secondary | ICD-10-CM | POA: Diagnosis not present

## 2019-12-30 DIAGNOSIS — M129 Arthropathy, unspecified: Secondary | ICD-10-CM | POA: Insufficient documentation

## 2019-12-30 DIAGNOSIS — C50411 Malignant neoplasm of upper-outer quadrant of right female breast: Secondary | ICD-10-CM | POA: Diagnosis present

## 2019-12-30 DIAGNOSIS — Z170 Estrogen receptor positive status [ER+]: Secondary | ICD-10-CM | POA: Diagnosis not present

## 2019-12-30 DIAGNOSIS — Z801 Family history of malignant neoplasm of trachea, bronchus and lung: Secondary | ICD-10-CM | POA: Insufficient documentation

## 2019-12-30 DIAGNOSIS — I10 Essential (primary) hypertension: Secondary | ICD-10-CM | POA: Diagnosis not present

## 2019-12-30 DIAGNOSIS — Z79899 Other long term (current) drug therapy: Secondary | ICD-10-CM | POA: Diagnosis not present

## 2019-12-30 DIAGNOSIS — Z803 Family history of malignant neoplasm of breast: Secondary | ICD-10-CM | POA: Insufficient documentation

## 2019-12-30 NOTE — Progress Notes (Signed)
Radiation Oncology         ([phone]) [phone] ________________________________  Name: Teresa Dunn        MRN: [id number]  Date of Service: 12/30/2019 DOB: 1966-05-11  CC:Leamon Arnt, MD  Magrinat, Virgie Dad, MD     REFERRING PHYSICIAN: Magrinat, Virgie Dad, MD   DIAGNOSIS: The encounter diagnosis was Malignant neoplasm of upper-outer quadrant of right breast in female, estrogen receptor positive ([place]).   HISTORY OF PRESENT ILLNESS: Teresa Dunn is a 54 y.o. female with a recently noted right-sided breast cancer.  The patient was found to have a 0.8 cm lesion within the right breast in the upper outer quadrant.  Some calcifications were also present within the left breast.  Biopsy of both of these areas has been performed.  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  Receptor studies indicated that the tumor is estrogen receptor positive, progesterone receptor positive, and HER-2/neu negative.  Ki-67 staining was 10%.  The lesion on the left did not return for any malignancy.  However this may benefit from resection as well.  She proceeded with bilateral lumpectomy on 12/03/2019.  Her left specimen revealed fibrocystic change and prior biopsy site.  On the right she did have a 1.3 cm invasive ductal carcinoma that was grade 1 and associated intermediate grade DCIS.  Her margins were clear clear, 1 mm to the anterior margin for invasive disease and 2 mm also to the anterior margin for in situ disease.  She had one sentinel node removed which was also negative for malignancy.  Oncotype scores were requested, with a score of 14 and she does not need additional systemic therapy. She comes today to discuss proceeding with adjuvant radiotherapy to the right breast.    PREVIOUS RADIATION THERAPY: No   PAST MEDICAL HISTORY:  Past Medical History:  Diagnosis Date  . Anxiety   . Arthritis   . Asthma   . Cervical dysplasia 1990   s/p cryotherapy with  normal f/u paps  . Depression   . Family history of breast cancer   . 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 Quit 2016  . Genital warts   . GERD (gastroesophageal reflux disease)   . History of cardiac murmur    per pt, mild and no cardiologist  . Hypertension   . PONV (postoperative nausea and vomiting)   . Primary osteoarthritis of right knee 09/02/2019       PAST SURGICAL HISTORY: Past Surgical History:  Procedure Laterality Date  . BREAST BIOPSY Right 11/09/2019  . BREAST LUMPECTOMY WITH RADIOACTIVE SEED AND SENTINEL LYMPH NODE BIOPSY Bilateral 12/03/2019   Procedure: BILATERAL BREAST LUMPECTOMY WITH RADIOACTIVE SEED AND RIGHT SENTINEL LYMPH NODE MAPPING;  Surgeon: Erroll Luna, MD;  Location: [facility];  Service: General;  Laterality: Bilateral;  PEC BLOCK  . [place]   with normal follow up paps  . KNEE SURGERY    . SHOULDER ARTHROSCOPY WITH SUBACROMIAL DECOMPRESSION Left 11/18/2014   Procedure: LEFT SHOULDER ARTHROSCOPY WITH SUBACROMIAL DECOMPRESSION/DEBRIDEMENT LABREAL/MINI DISTAL CLAVICLE RESECTION;  Surgeon: Susa Day, MD;  Location: [facility];  Service: Orthopedics;  Laterality: Left;  . TUBAL LIGATION       FAMILY HISTORY:  Family History  Problem Relation Age of Onset  . Heart disease Mother   . Hypertension Mother   . Diabetes Mother   . Lung cancer Mother   . Bipolar disorder Mother   .  Alcohol abuse Mother   . Heart disease Father   . Hypertension Father   . Diabetes Father   . Asthma Father   . Stroke Maternal Grandmother   . Breast cancer Maternal Grandmother   . ADD / ADHD Dunn   . Healthy Dunn   . Skin cancer Sister   . Cervical cancer Sister   . Healthy Brother   . Breast cancer Paternal Grandmother   . Cancer Maternal Aunt        unknown   . Lung cancer Maternal Grandfather   . Pneumonia Paternal Uncle   . Colon cancer Neg Hx      SOCIAL HISTORY:  reports that she quit smoking  about 5 years ago. Her smoking use included cigarettes. She has a 45.00 pack-year smoking history. She has never used smokeless tobacco. She reports current alcohol use. She reports that she does not use drugs. The patient is married and lives in [place]. 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  ALLERGIES: Codeine and Asa [aspirin]   MEDICATIONS:  Current Outpatient Medications  Medication Sig Dispense Refill  . albuterol (VENTOLIN HFA) 108 (90 Base) MCG/ACT inhaler Inhale 1-2 puffs into the lungs every 4 (four) hours as needed for wheezing or shortness of breath. 18 g 5  . clobetasol cream (TEMOVATE) 7.40 % Apply 1 application topically every morning.  3  . doxycycline (VIBRA-TABS) 100 MG tablet Take 1 tablet (100 mg total) by mouth daily. 30 tablet 1  . lisinopril (ZESTRIL) 30 MG tablet Take 1 tablet (30 mg total) by mouth at bedtime. 90 tablet 3  . loratadine (CLARITIN) 10 MG tablet Take 10 mg by mouth daily as needed for allergies.     . montelukast (SINGULAIR) 10 MG tablet Take 1 tablet (10 mg total) by mouth at bedtime. 30 tablet 11  . omeprazole (PRILOSEC) 40 MG capsule Take 1 capsule (40 mg total) by mouth 2 (two) times daily. 180 capsule 3  . sertraline (ZOLOFT) 50 MG tablet Take 1 tablet (50 mg total) by mouth at bedtime. 90 tablet 3  . SYMBICORT 160-4.5 MCG/ACT inhaler SMARTSIG:2 Puff(s) Via Inhaler Every Morning    . traMADol (ULTRAM) 50 MG tablet Take 1 tablet (50 mg total) by mouth every 6 (six) hours as needed. 20 tablet 0  . triamcinolone (NASACORT ALLERGY 24HR) 55 MCG/ACT AERO nasal inhaler Place 1 spray into the nose daily as needed (congestion/allergies.). 1 Inhaler 11   No current facility-administered medications for this encounter.     REVIEW OF SYSTEMS: On review of systems, the patient reports that she is doing well overall. She has some new cording in the right axilla but otherwise is doing pretty well. She has some  soreness in the area with movement.  She denies any chest pain, shortness of breath, cough, fevers, chills, night sweats, unintended weight changes. She denies any bowel or bladder disturbances, and denies abdominal pain, nausea or vomiting. She denies any new musculoskeletal or joint aches or pains. A complete review of systems is obtained and is otherwise negative.     PHYSICAL EXAM:  Wt Readings from Last 3 Encounters:  12/30/19 250 lb 6.4 oz (113.6 kg)  12/03/19 248 lb 0.3 oz (112.5 kg)  11/26/19 245 lb (111.1 kg)   Temp Readings from Last 3 Encounters:  12/30/19 98.5 F (36.9 C)  12/03/19 98.1 F (36.7 C)  11/26/19 98.9 F (37.2 C) (Temporal)   BP Readings from Last 3  Encounters:  12/30/19 (!) 149/80  12/03/19 (!) 143/75  11/26/19 (!) 144/75   Pulse Readings from Last 3 Encounters:  12/30/19 67  12/03/19 78  11/26/19 66    In general this is a well appearing caucasian female in no acute distress. She's alert and oriented x4 and appropriate throughout the examination. Cardiopulmonary assessment is negative for acute distress and she exhibits normal effort. Bilateral breast exam revealed well healed incision sites. She does have some palpable cording of the right axilla. No erythema, edema, or cellulitic changes are otherwise noted.    ECOG = 1  0 - Asymptomatic (Fully active, able to carry on all predisease activities without restriction)  1 - Symptomatic but completely ambulatory (Restricted in physically strenuous activity but ambulatory and able to carry out work of a light or sedentary nature. For example, light housework, office work)  2 - Symptomatic, <50% in bed during the day (Ambulatory and capable of all self care but unable to carry out any work activities. Up and about more than 50% of waking hours)  3 - Symptomatic, >50% in bed, but not bedbound (Capable of only limited self-care, confined to bed or chair 50% or more of waking hours)  4 - Bedbound  (Completely disabled. Cannot carry on any self-care. Totally confined to bed or chair)  5 - Death   Eustace Pen MM, Creech RH, Tormey DC, et al. [phone]). "Toxicity and response criteria of the [facility] Group". [place] Oncol. 5 (6): 649-55    LABORATORY DATA:  Lab Results  Component Value Date   WBC 6.4 11/26/2019   HGB 12.2 11/26/2019   HCT 36.7 11/26/2019   MCV 89.1 11/26/2019   PLT 268 11/26/2019   Lab Results  Component Value Date   NA 138 11/26/2019   K 4.6 11/26/2019   CL 105 11/26/2019   CO2 25 11/26/2019   Lab Results  Component Value Date   ALT 18 11/26/2019   AST 17 11/26/2019   ALKPHOS 107 11/26/2019   BILITOT 0.5 11/26/2019      RADIOGRAPHY: NM Sentinel Node Inj-No Rpt (Breast)  Result Date: 12/03/2019 Sulfur colloid was injected by the nuclear medicine technologist for melanoma sentinel node.   MM Breast Surgical Specimen  Result Date: 12/03/2019 CLINICAL DATA:  Specimen radiograph status post bilateral breast lumpectomies. EXAM: SPECIMEN RADIOGRAPH OF THE BILATERAL BREAST COMPARISON:  Previous exam(s). FINDINGS: Status post excision of the bilateral breasts. The radioactive seeds and biopsy marker clips are present and completely intact. These findings were communicated to the OR at 8:51 a.m. IMPRESSION: Specimen radiograph of the bilateral breast. Electronically Signed   By: Ammie Ferrier M.D.   On: 12/03/2019 08:54   MM Breast Surgical Specimen  Result Date: 12/03/2019 CLINICAL DATA:  Specimen radiograph status post bilateral breast lumpectomies. EXAM: SPECIMEN RADIOGRAPH OF THE BILATERAL BREAST COMPARISON:  Previous exam(s). FINDINGS: Status post excision of the bilateral breasts. The radioactive seeds and biopsy marker clips are present and completely intact. These findings were communicated to the OR at 8:51 a.m. IMPRESSION: Specimen radiograph of the bilateral breast. Electronically Signed   By: Ammie Ferrier M.D.   On: 12/03/2019  08:54   MM LT RADIOACTIVE SEED LOC MAMMO GUIDE  Result Date: 12/02/2019 CLINICAL DATA:  Radioactive seed localization was requested prior to excisional biopsy of a recently diagnosed complex sclerosing lesion in the inferior left breast. EXAM: MAMMOGRAPHIC GUIDED RADIOACTIVE SEED LOCALIZATION OF THE LEFT BREAST COMPARISON:  Previous exam(s). FINDINGS: Patient presents for  radioactive seed localization prior to excisional biopsy. I met with the patient and we discussed the procedure of seed localization including benefits and alternatives. We discussed the high likelihood of a successful procedure. We discussed the risks of the procedure including infection, bleeding, tissue injury and further surgery. We discussed the low dose of radioactivity involved in the procedure. Informed, written consent was given. The usual time-out protocol was performed immediately prior to the procedure. Using mammographic guidance, sterile technique, 1% lidocaine and an I-125 radioactive seed, coil shaped biopsy clip was localized using a lateral approach. The follow-up mammogram images confirm the seed in the expected location and were marked for Dr. Brantley Stage. Follow-up survey of the patient confirms presence of the radioactive seed. Order number of I-125 seed:  [id number]. Total activity:  0.247 mCi reference Date: 12 Nov 2019 The patient tolerated the procedure well and was released from the [facility]. She was given instructions regarding seed removal. IMPRESSION: Radioactive seed localization left breast. No apparent complications. Electronically Signed   By: Curlene Dolphin M.D.   On: 12/02/2019 15:21   MM RT RADIOACTIVE SEED LOC MAMMO GUIDE  Result Date: 12/02/2019 CLINICAL DATA:  54 year old patient presents for radioactive seed localization prior to lumpectomy for recently diagnosed grade 1 invasive ductal carcinoma of the right breast 11:30 position. EXAM: MAMMOGRAPHIC GUIDED RADIOACTIVE SEED LOCALIZATION OF THE RIGHT  BREAST COMPARISON:  Previous exam(s). FINDINGS: Patient presents for radioactive seed localization prior to lumpectomy. I met with the patient and we discussed the procedure of seed localization including benefits and alternatives. We discussed the high likelihood of a successful procedure. We discussed the risks of the procedure including infection, bleeding, tissue injury and further surgery. We discussed the low dose of radioactivity involved in the procedure. Informed, written consent was given. The usual time-out protocol was performed immediately prior to the procedure. Using mammographic guidance, sterile technique, 1% lidocaine and an I-125 radioactive seed, mass with distortion and associated coil shaped biopsy clip was localized using a lateral approach. The follow-up mammogram images confirm the seed in the expected location and were marked for Dr. Brantley Stage. Follow-up survey of the patient confirms presence of the radioactive seed. Order number of I-125 seed:  [id number]. Total activity: 0.247 mCi reference Date: 12 Nov 2019 The patient tolerated the procedure well and was released from the [facility]. She was given instructions regarding seed removal. IMPRESSION: Radioactive seed localization right breast. No apparent complications. Electronically Signed   By: Curlene Dolphin M.D.   On: 12/02/2019 15:24       IMPRESSION/PLAN: 1. Stage IA, [id number] grade 1 ER/PR positive invasive ductal carcinoma of the right breast. Dr. Lisbeth Renshaw has reviewed the patient's pathology results and since she does not need systemic therapy, would recommend adjuvant radiotherapy to the right breast. She will also begin antiestrogen therapy following radiotherapy. We discussed the risks, benefits, short, and long term effects of radiotherapy, and the patient is interested in proceeding. I discussed the delivery and logistics of radiotherapy and anticipates a course of 6 1/2 weeks of radiotherapy. Written consent is obtained and  placed in the chart, a copy was provided to the patient. She will simulate today and begin treatment next week. 2. Palpable cording in the right axilla. I offered evaluation with PT but she will follow this with the stretching Dr. Brantley Stage recommended. If she need referral she will let us know so we can facilitate this visit. 3. Contraceptive Counseling. The patient is menopausal and has had BTL and does  not need pregnancy testing prior to proceeding.   In a visit lasting 45 minutes, greater than 50% of the time was spent face to face reviewing her case, as well as in preparation of, discussing, and coordinating the patient's care.     Carola Rhine, PAC

## 2020-01-05 ENCOUNTER — Encounter: Payer: Self-pay | Admitting: *Deleted

## 2020-01-05 DIAGNOSIS — C221 Intrahepatic bile duct carcinoma: Secondary | ICD-10-CM | POA: Diagnosis not present

## 2020-01-06 ENCOUNTER — Ambulatory Visit
Admission: RE | Admit: 2020-01-06 | Discharge: 2020-01-06 | Disposition: A | Discharge status: Home / Self Care | Payer: BC Managed Care – PPO | Source: Ambulatory Visit | Attending: Radiation Oncology | Admitting: Radiation Oncology

## 2020-01-06 ENCOUNTER — Other Ambulatory Visit: Payer: Self-pay

## 2020-01-06 DIAGNOSIS — C221 Intrahepatic bile duct carcinoma: Secondary | ICD-10-CM | POA: Diagnosis not present

## 2020-01-07 ENCOUNTER — Ambulatory Visit
Admission: RE | Admit: 2020-01-07 | Discharge: 2020-01-07 | Disposition: A | Discharge status: Home / Self Care | Payer: BC Managed Care – PPO | Source: Ambulatory Visit | Attending: Radiation Oncology | Admitting: Radiation Oncology

## 2020-01-07 ENCOUNTER — Other Ambulatory Visit: Payer: Self-pay

## 2020-01-07 DIAGNOSIS — C221 Intrahepatic bile duct carcinoma: Secondary | ICD-10-CM | POA: Diagnosis not present

## 2020-01-07 NOTE — Progress Notes (Signed)
Pt here for patient teaching.  Pt given Radiation and You booklet, skin care instructions, Alra deodorant and Sonafine.  Reviewed areas of pertinence such as fatigue, hair loss, skin changes, breast tenderness and breast swelling . Pt able to give teach back of to pat skin and use unscented/gentle soap,apply Sonafine bid, avoid applying anything to skin within 4 hours of treatment, avoid wearing an under wire bra and to use an electric razor if they must shave. Pt verbalizes understanding of information given and will contact nursing with any questions or concerns.     LaToya M. Silva RN, BSN             

## 2020-01-08 ENCOUNTER — Ambulatory Visit
Admission: RE | Admit: 2020-01-08 | Discharge: 2020-01-08 | Disposition: A | Discharge status: Home / Self Care | Payer: BC Managed Care – PPO | Source: Ambulatory Visit | Attending: Radiation Oncology | Admitting: Radiation Oncology

## 2020-01-08 DIAGNOSIS — C50411 Malignant neoplasm of upper-outer quadrant of right female breast: Secondary | ICD-10-CM

## 2020-01-08 DIAGNOSIS — C221 Intrahepatic bile duct carcinoma: Secondary | ICD-10-CM | POA: Diagnosis not present

## 2020-01-08 DIAGNOSIS — Z170 Estrogen receptor positive status [ER+]: Secondary | ICD-10-CM

## 2020-01-08 MED ORDER — ALRA NON-METALLIC DEODORANT (RAD-ONC)
1.0000 "application " | Freq: Once | TOPICAL | Status: AC
  Administered 2020-01-08: 1 via TOPICAL

## 2020-01-08 MED ORDER — SONAFINE EX EMUL
1.0000 "application " | Freq: Two times a day (BID) | CUTANEOUS | Status: DC
  Administered 2020-01-08: 1 via TOPICAL

## 2020-01-11 ENCOUNTER — Ambulatory Visit
Admission: RE | Admit: 2020-01-11 | Discharge: 2020-01-11 | Disposition: A | Discharge status: Home / Self Care | Payer: BC Managed Care – PPO | Source: Ambulatory Visit | Attending: Radiation Oncology | Admitting: Radiation Oncology

## 2020-01-11 ENCOUNTER — Other Ambulatory Visit: Payer: Self-pay

## 2020-01-11 DIAGNOSIS — C221 Intrahepatic bile duct carcinoma: Secondary | ICD-10-CM | POA: Diagnosis not present

## 2020-01-12 ENCOUNTER — Ambulatory Visit
Admission: RE | Admit: 2020-01-12 | Discharge: 2020-01-12 | Disposition: A | Discharge status: Home / Self Care | Payer: BC Managed Care – PPO | Source: Ambulatory Visit | Attending: Radiation Oncology | Admitting: Radiation Oncology

## 2020-01-12 ENCOUNTER — Other Ambulatory Visit: Payer: Self-pay

## 2020-01-12 DIAGNOSIS — C221 Intrahepatic bile duct carcinoma: Secondary | ICD-10-CM | POA: Diagnosis not present

## 2020-01-13 ENCOUNTER — Other Ambulatory Visit: Payer: Self-pay

## 2020-01-13 ENCOUNTER — Ambulatory Visit
Admission: RE | Admit: 2020-01-13 | Discharge: 2020-01-13 | Disposition: A | Discharge status: Home / Self Care | Payer: BC Managed Care – PPO | Source: Ambulatory Visit | Attending: Radiation Oncology | Admitting: Radiation Oncology

## 2020-01-13 DIAGNOSIS — C221 Intrahepatic bile duct carcinoma: Secondary | ICD-10-CM | POA: Diagnosis not present

## 2020-01-14 ENCOUNTER — Ambulatory Visit
Admission: RE | Admit: 2020-01-14 | Discharge: 2020-01-14 | Disposition: A | Discharge status: Home / Self Care | Payer: BC Managed Care – PPO | Source: Ambulatory Visit | Attending: Radiation Oncology | Admitting: Radiation Oncology

## 2020-01-14 ENCOUNTER — Other Ambulatory Visit: Payer: Self-pay

## 2020-01-14 DIAGNOSIS — C221 Intrahepatic bile duct carcinoma: Secondary | ICD-10-CM | POA: Diagnosis not present

## 2020-01-15 ENCOUNTER — Ambulatory Visit
Admission: RE | Admit: 2020-01-15 | Discharge: 2020-01-15 | Disposition: A | Discharge status: Home / Self Care | Payer: BC Managed Care – PPO | Source: Ambulatory Visit | Attending: Radiation Oncology | Admitting: Radiation Oncology

## 2020-01-15 ENCOUNTER — Other Ambulatory Visit: Payer: Self-pay

## 2020-01-15 DIAGNOSIS — C221 Intrahepatic bile duct carcinoma: Secondary | ICD-10-CM | POA: Diagnosis not present

## 2020-01-18 ENCOUNTER — Ambulatory Visit
Admission: RE | Admit: 2020-01-18 | Discharge: 2020-01-18 | Disposition: A | Discharge status: Home / Self Care | Payer: BC Managed Care – PPO | Source: Ambulatory Visit | Attending: Radiation Oncology | Admitting: Radiation Oncology

## 2020-01-18 ENCOUNTER — Other Ambulatory Visit: Payer: Self-pay

## 2020-01-18 DIAGNOSIS — Z510 Encounter for antineoplastic radiation therapy: Secondary | ICD-10-CM | POA: Insufficient documentation

## 2020-01-18 DIAGNOSIS — C50411 Malignant neoplasm of upper-outer quadrant of right female breast: Secondary | ICD-10-CM | POA: Diagnosis not present

## 2020-01-18 DIAGNOSIS — C221 Intrahepatic bile duct carcinoma: Secondary | ICD-10-CM | POA: Diagnosis not present

## 2020-01-18 DIAGNOSIS — Z170 Estrogen receptor positive status [ER+]: Secondary | ICD-10-CM | POA: Diagnosis not present

## 2020-01-19 ENCOUNTER — Other Ambulatory Visit: Payer: Self-pay

## 2020-01-19 ENCOUNTER — Ambulatory Visit
Admission: RE | Admit: 2020-01-19 | Discharge: 2020-01-19 | Disposition: A | Discharge status: Home / Self Care | Payer: BC Managed Care – PPO | Source: Ambulatory Visit | Attending: Radiation Oncology | Admitting: Radiation Oncology

## 2020-01-19 DIAGNOSIS — C50411 Malignant neoplasm of upper-outer quadrant of right female breast: Secondary | ICD-10-CM | POA: Diagnosis not present

## 2020-01-20 ENCOUNTER — Other Ambulatory Visit: Payer: Self-pay

## 2020-01-20 ENCOUNTER — Ambulatory Visit
Admission: RE | Admit: 2020-01-20 | Discharge: 2020-01-20 | Disposition: A | Discharge status: Home / Self Care | Payer: BC Managed Care – PPO | Source: Ambulatory Visit | Attending: Radiation Oncology | Admitting: Radiation Oncology

## 2020-01-20 DIAGNOSIS — C50411 Malignant neoplasm of upper-outer quadrant of right female breast: Secondary | ICD-10-CM | POA: Diagnosis not present

## 2020-01-21 ENCOUNTER — Other Ambulatory Visit: Payer: Self-pay

## 2020-01-21 ENCOUNTER — Ambulatory Visit
Admission: RE | Admit: 2020-01-21 | Discharge: 2020-01-21 | Disposition: A | Discharge status: Home / Self Care | Payer: BC Managed Care – PPO | Source: Ambulatory Visit | Attending: Radiation Oncology | Admitting: Radiation Oncology

## 2020-01-21 DIAGNOSIS — C50411 Malignant neoplasm of upper-outer quadrant of right female breast: Secondary | ICD-10-CM | POA: Diagnosis not present

## 2020-01-22 ENCOUNTER — Ambulatory Visit
Admission: RE | Admit: 2020-01-22 | Discharge: 2020-01-22 | Disposition: A | Discharge status: Home / Self Care | Payer: BC Managed Care – PPO | Source: Ambulatory Visit | Attending: Radiation Oncology | Admitting: Radiation Oncology

## 2020-01-22 ENCOUNTER — Other Ambulatory Visit: Payer: Self-pay

## 2020-01-22 DIAGNOSIS — C50411 Malignant neoplasm of upper-outer quadrant of right female breast: Secondary | ICD-10-CM | POA: Diagnosis not present

## 2020-01-25 ENCOUNTER — Other Ambulatory Visit: Payer: Self-pay

## 2020-01-25 ENCOUNTER — Ambulatory Visit
Admission: RE | Admit: 2020-01-25 | Discharge: 2020-01-25 | Disposition: A | Discharge status: Home / Self Care | Payer: BC Managed Care – PPO | Source: Ambulatory Visit | Attending: Radiation Oncology | Admitting: Radiation Oncology

## 2020-01-25 DIAGNOSIS — C50411 Malignant neoplasm of upper-outer quadrant of right female breast: Secondary | ICD-10-CM | POA: Diagnosis not present

## 2020-01-26 ENCOUNTER — Other Ambulatory Visit: Payer: Self-pay

## 2020-01-26 ENCOUNTER — Ambulatory Visit
Admission: RE | Admit: 2020-01-26 | Discharge: 2020-01-26 | Disposition: A | Discharge status: Home / Self Care | Payer: BC Managed Care – PPO | Source: Ambulatory Visit | Attending: Radiation Oncology | Admitting: Radiation Oncology

## 2020-01-26 DIAGNOSIS — C50411 Malignant neoplasm of upper-outer quadrant of right female breast: Secondary | ICD-10-CM | POA: Diagnosis not present

## 2020-01-27 ENCOUNTER — Other Ambulatory Visit: Payer: Self-pay

## 2020-01-27 ENCOUNTER — Ambulatory Visit
Admission: RE | Admit: 2020-01-27 | Discharge: 2020-01-27 | Disposition: A | Discharge status: Home / Self Care | Payer: BC Managed Care – PPO | Source: Ambulatory Visit | Attending: Radiation Oncology | Admitting: Radiation Oncology

## 2020-01-27 DIAGNOSIS — C50411 Malignant neoplasm of upper-outer quadrant of right female breast: Secondary | ICD-10-CM | POA: Diagnosis not present

## 2020-01-28 ENCOUNTER — Ambulatory Visit
Admission: RE | Admit: 2020-01-28 | Discharge: 2020-01-28 | Disposition: A | Discharge status: Home / Self Care | Payer: BC Managed Care – PPO | Source: Ambulatory Visit | Attending: Radiation Oncology | Admitting: Radiation Oncology

## 2020-01-28 ENCOUNTER — Other Ambulatory Visit: Payer: Self-pay

## 2020-01-28 DIAGNOSIS — C50411 Malignant neoplasm of upper-outer quadrant of right female breast: Secondary | ICD-10-CM | POA: Diagnosis not present

## 2020-01-29 ENCOUNTER — Other Ambulatory Visit: Payer: Self-pay

## 2020-01-29 ENCOUNTER — Ambulatory Visit
Admission: RE | Admit: 2020-01-29 | Discharge: 2020-01-29 | Disposition: A | Discharge status: Home / Self Care | Payer: BC Managed Care – PPO | Source: Ambulatory Visit | Attending: Radiation Oncology | Admitting: Radiation Oncology

## 2020-01-29 DIAGNOSIS — Z170 Estrogen receptor positive status [ER+]: Secondary | ICD-10-CM

## 2020-01-29 DIAGNOSIS — C50411 Malignant neoplasm of upper-outer quadrant of right female breast: Secondary | ICD-10-CM

## 2020-01-29 MED ORDER — SONAFINE EX EMUL
1.0000 "application " | Freq: Once | CUTANEOUS | Status: AC
  Administered 2020-01-29: 1 via TOPICAL

## 2020-02-01 ENCOUNTER — Other Ambulatory Visit: Payer: Self-pay

## 2020-02-01 ENCOUNTER — Ambulatory Visit
Admission: RE | Admit: 2020-02-01 | Discharge: 2020-02-01 | Disposition: A | Discharge status: Home / Self Care | Payer: BC Managed Care – PPO | Source: Ambulatory Visit | Attending: Radiation Oncology | Admitting: Radiation Oncology

## 2020-02-01 DIAGNOSIS — C50411 Malignant neoplasm of upper-outer quadrant of right female breast: Secondary | ICD-10-CM | POA: Diagnosis not present

## 2020-02-02 ENCOUNTER — Ambulatory Visit
Admission: RE | Admit: 2020-02-02 | Discharge: 2020-02-02 | Disposition: A | Discharge status: Home / Self Care | Payer: BC Managed Care – PPO | Source: Ambulatory Visit | Attending: Radiation Oncology | Admitting: Radiation Oncology

## 2020-02-02 ENCOUNTER — Other Ambulatory Visit: Payer: Self-pay

## 2020-02-02 DIAGNOSIS — C50411 Malignant neoplasm of upper-outer quadrant of right female breast: Secondary | ICD-10-CM | POA: Diagnosis not present

## 2020-02-03 ENCOUNTER — Ambulatory Visit
Admission: RE | Admit: 2020-02-03 | Discharge: 2020-02-03 | Disposition: A | Discharge status: Home / Self Care | Payer: BC Managed Care – PPO | Source: Ambulatory Visit | Attending: Radiation Oncology | Admitting: Radiation Oncology

## 2020-02-03 ENCOUNTER — Other Ambulatory Visit: Payer: Self-pay

## 2020-02-03 DIAGNOSIS — C50411 Malignant neoplasm of upper-outer quadrant of right female breast: Secondary | ICD-10-CM | POA: Diagnosis not present

## 2020-02-04 ENCOUNTER — Other Ambulatory Visit: Payer: Self-pay

## 2020-02-04 ENCOUNTER — Ambulatory Visit
Admission: RE | Admit: 2020-02-04 | Discharge: 2020-02-04 | Disposition: A | Discharge status: Home / Self Care | Payer: BC Managed Care – PPO | Source: Ambulatory Visit | Attending: Radiation Oncology | Admitting: Radiation Oncology

## 2020-02-04 DIAGNOSIS — C50411 Malignant neoplasm of upper-outer quadrant of right female breast: Secondary | ICD-10-CM | POA: Diagnosis not present

## 2020-02-05 ENCOUNTER — Ambulatory Visit: Payer: BC Managed Care – PPO

## 2020-02-08 ENCOUNTER — Other Ambulatory Visit: Payer: Self-pay

## 2020-02-08 ENCOUNTER — Ambulatory Visit
Admission: RE | Admit: 2020-02-08 | Discharge: 2020-02-08 | Disposition: A | Discharge status: Home / Self Care | Payer: BC Managed Care – PPO | Source: Ambulatory Visit | Attending: Radiation Oncology | Admitting: Radiation Oncology

## 2020-02-08 DIAGNOSIS — C50411 Malignant neoplasm of upper-outer quadrant of right female breast: Secondary | ICD-10-CM | POA: Diagnosis not present

## 2020-02-09 ENCOUNTER — Ambulatory Visit
Admission: RE | Admit: 2020-02-09 | Discharge: 2020-02-09 | Disposition: A | Discharge status: Home / Self Care | Payer: BC Managed Care – PPO | Source: Ambulatory Visit | Attending: Radiation Oncology | Admitting: Radiation Oncology

## 2020-02-09 ENCOUNTER — Other Ambulatory Visit: Payer: Self-pay

## 2020-02-09 DIAGNOSIS — C50411 Malignant neoplasm of upper-outer quadrant of right female breast: Secondary | ICD-10-CM | POA: Diagnosis not present

## 2020-02-10 ENCOUNTER — Ambulatory Visit
Admission: RE | Admit: 2020-02-10 | Discharge: 2020-02-10 | Disposition: A | Discharge status: Home / Self Care | Payer: BC Managed Care – PPO | Source: Ambulatory Visit | Attending: Radiation Oncology | Admitting: Radiation Oncology

## 2020-02-10 ENCOUNTER — Other Ambulatory Visit: Payer: Self-pay

## 2020-02-10 DIAGNOSIS — C50411 Malignant neoplasm of upper-outer quadrant of right female breast: Secondary | ICD-10-CM | POA: Diagnosis not present

## 2020-02-11 ENCOUNTER — Ambulatory Visit
Admission: RE | Admit: 2020-02-11 | Discharge: 2020-02-11 | Disposition: A | Discharge status: Home / Self Care | Payer: BC Managed Care – PPO | Source: Ambulatory Visit | Attending: Radiation Oncology | Admitting: Radiation Oncology

## 2020-02-11 ENCOUNTER — Other Ambulatory Visit: Payer: Self-pay

## 2020-02-11 DIAGNOSIS — C50411 Malignant neoplasm of upper-outer quadrant of right female breast: Secondary | ICD-10-CM | POA: Diagnosis not present

## 2020-02-12 ENCOUNTER — Ambulatory Visit
Admission: RE | Admit: 2020-02-12 | Discharge: 2020-02-12 | Disposition: A | Discharge status: Home / Self Care | Payer: BC Managed Care – PPO | Source: Ambulatory Visit | Attending: Radiation Oncology | Admitting: Radiation Oncology

## 2020-02-12 ENCOUNTER — Other Ambulatory Visit: Payer: Self-pay

## 2020-02-12 ENCOUNTER — Ambulatory Visit: Payer: BC Managed Care – PPO | Admitting: Radiation Oncology

## 2020-02-12 DIAGNOSIS — C50411 Malignant neoplasm of upper-outer quadrant of right female breast: Secondary | ICD-10-CM | POA: Diagnosis not present

## 2020-02-15 ENCOUNTER — Other Ambulatory Visit: Payer: Self-pay

## 2020-02-15 ENCOUNTER — Ambulatory Visit
Admission: RE | Admit: 2020-02-15 | Discharge: 2020-02-15 | Disposition: A | Discharge status: Home / Self Care | Payer: BC Managed Care – PPO | Source: Ambulatory Visit | Attending: Radiation Oncology | Admitting: Radiation Oncology

## 2020-02-15 ENCOUNTER — Ambulatory Visit: Payer: BC Managed Care – PPO

## 2020-02-15 DIAGNOSIS — C50411 Malignant neoplasm of upper-outer quadrant of right female breast: Secondary | ICD-10-CM | POA: Diagnosis not present

## 2020-02-16 ENCOUNTER — Ambulatory Visit: Payer: BC Managed Care – PPO

## 2020-02-16 ENCOUNTER — Ambulatory Visit
Admission: RE | Admit: 2020-02-16 | Discharge: 2020-02-16 | Disposition: A | Discharge status: Home / Self Care | Payer: BC Managed Care – PPO | Source: Ambulatory Visit | Attending: Radiation Oncology | Admitting: Radiation Oncology

## 2020-02-16 ENCOUNTER — Other Ambulatory Visit: Payer: Self-pay

## 2020-02-16 DIAGNOSIS — C50411 Malignant neoplasm of upper-outer quadrant of right female breast: Secondary | ICD-10-CM | POA: Diagnosis not present

## 2020-02-17 ENCOUNTER — Other Ambulatory Visit: Payer: Self-pay

## 2020-02-17 ENCOUNTER — Ambulatory Visit
Admission: RE | Admit: 2020-02-17 | Discharge: 2020-02-17 | Disposition: A | Discharge status: Home / Self Care | Payer: BC Managed Care – PPO | Source: Ambulatory Visit | Attending: Radiation Oncology | Admitting: Radiation Oncology

## 2020-02-17 DIAGNOSIS — C50411 Malignant neoplasm of upper-outer quadrant of right female breast: Secondary | ICD-10-CM | POA: Insufficient documentation

## 2020-02-17 DIAGNOSIS — Z170 Estrogen receptor positive status [ER+]: Secondary | ICD-10-CM | POA: Insufficient documentation

## 2020-02-17 DIAGNOSIS — Z510 Encounter for antineoplastic radiation therapy: Secondary | ICD-10-CM | POA: Diagnosis not present

## 2020-02-17 DIAGNOSIS — C221 Intrahepatic bile duct carcinoma: Secondary | ICD-10-CM | POA: Insufficient documentation

## 2020-02-18 ENCOUNTER — Encounter: Payer: Self-pay | Admitting: *Deleted

## 2020-02-18 ENCOUNTER — Ambulatory Visit
Admission: RE | Admit: 2020-02-18 | Discharge: 2020-02-18 | Disposition: A | Discharge status: Home / Self Care | Payer: BC Managed Care – PPO | Source: Ambulatory Visit | Attending: Radiation Oncology | Admitting: Radiation Oncology

## 2020-02-18 ENCOUNTER — Other Ambulatory Visit: Payer: Self-pay

## 2020-02-18 DIAGNOSIS — C50411 Malignant neoplasm of upper-outer quadrant of right female breast: Secondary | ICD-10-CM | POA: Diagnosis not present

## 2020-02-19 ENCOUNTER — Other Ambulatory Visit: Payer: Self-pay

## 2020-02-19 ENCOUNTER — Ambulatory Visit
Admission: RE | Admit: 2020-02-19 | Discharge: 2020-02-19 | Disposition: A | Discharge status: Home / Self Care | Payer: BC Managed Care – PPO | Source: Ambulatory Visit | Attending: Radiation Oncology | Admitting: Radiation Oncology

## 2020-02-19 DIAGNOSIS — C50411 Malignant neoplasm of upper-outer quadrant of right female breast: Secondary | ICD-10-CM | POA: Diagnosis not present

## 2020-02-23 ENCOUNTER — Ambulatory Visit
Admission: RE | Admit: 2020-02-23 | Discharge: 2020-02-23 | Disposition: A | Discharge status: Home / Self Care | Payer: BC Managed Care – PPO | Source: Ambulatory Visit | Attending: Radiation Oncology | Admitting: Radiation Oncology

## 2020-02-23 ENCOUNTER — Encounter: Payer: Self-pay | Admitting: Radiation Oncology

## 2020-02-23 ENCOUNTER — Encounter: Payer: Self-pay | Admitting: *Deleted

## 2020-02-23 DIAGNOSIS — C50411 Malignant neoplasm of upper-outer quadrant of right female breast: Secondary | ICD-10-CM | POA: Diagnosis not present

## 2020-02-24 ENCOUNTER — Ambulatory Visit: Payer: BC Managed Care – PPO

## 2020-03-07 ENCOUNTER — Ambulatory Visit (INDEPENDENT_AMBULATORY_CARE_PROVIDER_SITE_OTHER): Payer: BC Managed Care – PPO | Admitting: Family Medicine

## 2020-03-07 ENCOUNTER — Encounter: Payer: Self-pay | Admitting: Family Medicine

## 2020-03-07 ENCOUNTER — Other Ambulatory Visit: Payer: Self-pay

## 2020-03-07 VITALS — BP 138/80 | HR 73 | Temp 97.6°F | Ht 67.0 in | Wt 251.2 lb

## 2020-03-07 DIAGNOSIS — Z01818 Encounter for other preprocedural examination: Secondary | ICD-10-CM

## 2020-03-07 DIAGNOSIS — F411 Generalized anxiety disorder: Secondary | ICD-10-CM

## 2020-03-07 DIAGNOSIS — I10 Essential (primary) hypertension: Secondary | ICD-10-CM

## 2020-03-07 DIAGNOSIS — Z170 Estrogen receptor positive status [ER+]: Secondary | ICD-10-CM

## 2020-03-07 DIAGNOSIS — M94262 Chondromalacia, left knee: Secondary | ICD-10-CM

## 2020-03-07 DIAGNOSIS — R7301 Impaired fasting glucose: Secondary | ICD-10-CM

## 2020-03-07 DIAGNOSIS — Z23 Encounter for immunization: Secondary | ICD-10-CM | POA: Diagnosis not present

## 2020-03-07 DIAGNOSIS — C50411 Malignant neoplasm of upper-outer quadrant of right female breast: Secondary | ICD-10-CM

## 2020-03-07 DIAGNOSIS — K219 Gastro-esophageal reflux disease without esophagitis: Secondary | ICD-10-CM

## 2020-03-07 DIAGNOSIS — J4540 Moderate persistent asthma, uncomplicated: Secondary | ICD-10-CM

## 2020-03-07 DIAGNOSIS — F339 Major depressive disorder, recurrent, unspecified: Secondary | ICD-10-CM

## 2020-03-07 LAB — POCT GLYCOSYLATED HEMOGLOBIN (HGB A1C): Hemoglobin A1C: 5.3 % (ref 4.0–5.6)

## 2020-03-07 MED ORDER — FLUTICASONE-SALMETEROL 250-50 MCG/DOSE IN AEPB: 1.0000 | INHALATION_SPRAY | Freq: Two times a day (BID) | RESPIRATORY_TRACT | 11 refills | Status: DC

## 2020-03-07 MED ORDER — LISINOPRIL 40 MG PO TABS: 40.0000 mg | ORAL_TABLET | Freq: Every day | ORAL | 3 refills | Status: DC

## 2020-03-07 MED ORDER — OMEPRAZOLE 40 MG PO CPDR: 40.0000 mg | DELAYED_RELEASE_CAPSULE | Freq: Two times a day (BID) | ORAL | 3 refills | Status: DC

## 2020-03-07 MED ORDER — SERTRALINE HCL 50 MG PO TABS: 50.0000 mg | ORAL_TABLET | Freq: Every day | ORAL | 3 refills | Status: DC

## 2020-03-07 NOTE — Progress Notes (Signed)
Subjective  CC:  Chief Complaint  Patient presents with  . Hypertension  . Asthma    prefers Waxela over Symbicort  . Breast Cancer  . Depression  . Prediabetes    HPI: Teresa Dunn is a 54 y.o. female who presents to the office today to address the problems listed above in the chief complaint.  Hypertension f/u: Control is fair . Pt reports she is doing well. taking medications as instructed, no medication side effects noted, no TIAs, no chest pain on exertion, no dyspnea on exertion, no swelling of ankles.  I reviewed recent blood pressure readings at outside offices during radiation treatment and oncology follow-up for blood pressure control there has been borderline as well.  She denies adverse effects from his BP medications. Compliance with medication is good.  She takes lisinopril 30 mg daily  Preoperative clearance: She is scheduled for left knee replacement due to chondromalacia and meniscal tear in November of this year.  Dr. Alvan Dame is her surgeon.  She has no cardiac disease.  She does have asthma which she reports is well controlled.  She has been using Symbicort but in the past has used wixela; she prefers the inhalation of the disc over the [facility].  She would like this change.  She has not used albuterol and has not had any recent exacerbations.  No cough.  No symptoms at night.  She is due for flu shot today.  Anxiety disorder and depression: She was very stressed during her treatment for breast cancer.  However since completing treatment she is feeling much better.  No longer having symptoms of anxiety or depression.  She continues on Zoloft 50 mg daily.  Breast cancer, right status post lumpectomy, radiation treatment.  Stage I and does not need any further treatment at this time.  GERD is well controlled on chronic PPI.  She does need refills.  History of IFG in setting of obesity: She is lost a few pounds.  She is happy about this.  She has no symptoms of  hypoglycemia.  Assessment  1. Preoperative clearance   2. Essential hypertension   3. Generalized anxiety disorder   4. IFG (impaired fasting glucose)   5. Malignant neoplasm of upper-outer quadrant of right breast in female, estrogen receptor positive ([place])   6. Chondromalacia, left knee   7. Moderate persistent asthma without complication   8. Gastroesophageal reflux disease, unspecified whether esophagitis present   9. Major depression, recurrent, chronic ([place])   10. Need for immunization against influenza      Plan    Hypertension f/u: BP control is fairly well controlled.  Will increase lisinopril to 40 mg daily.  Breast cancer f/u: She is completed treatment and feels happy about that.  She was told she is cancer free.  Fortunately it was chronic a very early stage.  She is healing and recovering.  Routine mammography and follow-up with oncology as recommended.  Preoperative clearance: Mild to moderate risk due to asthma.  No cardiovascular testing needed.  We will check lab work in late October.  Orders placed.  GERD is well controlled: Refilled PPI  Mood disorder: Well-controlled.  Continue Zoloft 50 mg daily.  Obesity: Continue to work on weight loss.  Fortunately she has a normal A1c at this time.  Continue healthy diet changes.  Asthma: Change inhalers.  Clinically well controlled Flu shot today Education regarding management of these chronic disease states was given. Management strategies discussed on successive visits include dietary  and exercise recommendations, goals of achieving and maintaining IBW, and lifestyle modifications aiming for adequate sleep and minimizing stressors.   Follow up: 6 months for complete physical  Orders Placed This Encounter  Procedures  . Flu Vaccine QUAD 36+ mos IM  . COMPLETE METABOLIC PANEL WITH GFR  . Lipid panel  . CBC with Differential/Platelet  . TSH  . POCT glycosylated hemoglobin (Hb A1C)   Meds ordered this encounter    Medications  . Fluticasone-Salmeterol (WIXELA INHUB) 250-50 MCG/DOSE AEPB    Sig: Inhale 1 puff into the lungs in the morning and at bedtime.    Dispense:  60 each    Refill:  11  . lisinopril (ZESTRIL) 40 MG tablet    Sig: Take 1 tablet (40 mg total) by mouth at bedtime.    Dispense:  90 tablet    Refill:  3  . omeprazole (PRILOSEC) 40 MG capsule    Sig: Take 1 capsule (40 mg total) by mouth 2 (two) times daily.    Dispense:  180 capsule    Refill:  3  . sertraline (ZOLOFT) 50 MG tablet    Sig: Take 1 tablet (50 mg total) by mouth at bedtime.    Dispense:  90 tablet    Refill:  3      BP Readings from Last 3 Encounters:  03/07/20 138/80  12/30/19 (!) 149/80  12/03/19 (!) 143/75   Wt Readings from Last 3 Encounters:  03/07/20 251 lb 3.2 oz (113.9 kg)  12/30/19 250 lb 6.4 oz (113.6 kg)  12/03/19 248 lb 0.3 oz (112.5 kg)    Lab Results  Component Value Date   CHOL 193 09/02/2019   CHOL 178 03/27/2018   Lab Results  Component Value Date   HDL 74.70 09/02/2019   HDL 79.90 03/27/2018   Lab Results  Component Value Date   LDLCALC 101 (H) 09/02/2019   LDLCALC 87 03/27/2018   Lab Results  Component Value Date   TRIG 85.0 09/02/2019   TRIG 57.0 03/27/2018   Lab Results  Component Value Date   CHOLHDL 3 09/02/2019   CHOLHDL 2 03/27/2018   No results found for: LDLDIRECT Lab Results  Component Value Date   CREATININE 0.71 11/26/2019   BUN 13 11/26/2019   NA 138 11/26/2019   K 4.6 11/26/2019   CL 105 11/26/2019   CO2 25 11/26/2019    The 10-year ASCVD risk score Mikey Bussing DC Jr., et al., 2013) is: 1.6%   Values used to calculate the score:     Age: 62 years     Sex: Female     Is Non-Hispanic African American: No     Diabetic: No     Tobacco smoker: No     Systolic Blood Pressure: 626 mmHg     Is BP treated: Yes     HDL Cholesterol: 74.7 mg/dL     Total Cholesterol: 193 mg/dL  I reviewed the patients updated PMH, FH, and SocHx.    Patient Active  Problem List   Diagnosis Date Noted  . Malignant neoplasm of upper-outer quadrant of right breast in female, estrogen receptor positive ([place]) 11/18/2019    Priority: High  . Obesity (BMI 30-39.9) 03/27/2018    Priority: High  . Essential hypertension 02/25/2018    Priority: High  . Former heavy tobacco smoker 02/25/2018    Priority: High  . Generalized anxiety disorder 10/10/2010    Priority: High  . Moderate persistent asthma 02/25/2018    Priority:  Medium  . Chondromalacia, left knee 04/18/2017    Priority: Medium  . Chronic mixed headache syndrome 08/07/2012    Priority: Medium  . GERD (gastroesophageal reflux disease) 06/26/2012    Priority: Medium  . Major depression, recurrent, chronic ([place]) 10/23/2010    Priority: Medium  . Primary osteoarthritis of right knee 09/02/2019    Priority: Low  . Lipoma of abdominal wall 02/25/2018    Priority: Low  . Plantar fasciitis of left foot 11/30/2016    Priority: Low  . Tear of medial meniscus of left knee 11/30/2016    Priority: Low  . Perennial allergic rhinitis 08/07/2012    Priority: Low  . Family history of breast cancer     Allergies: Codeine and Asa [aspirin]  Social History: Patient  reports that she quit smoking about 5 years ago. Her smoking use included cigarettes. She has a 45.00 pack-year smoking history. She has never used smokeless tobacco. She reports current alcohol use. She reports that she does not use drugs.  Current Meds  Medication Sig  . albuterol (VENTOLIN HFA) 108 (90 Base) MCG/ACT inhaler Inhale 1-2 puffs into the lungs every 4 (four) hours as needed for wheezing or shortness of breath.  . clobetasol cream (TEMOVATE) 1.63 % Apply 1 application topically every morning.  Marland Kitchen lisinopril (ZESTRIL) 40 MG tablet Take 1 tablet (40 mg total) by mouth at bedtime.  Marland Kitchen loratadine (CLARITIN) 10 MG tablet Take 10 mg by mouth daily as needed for allergies.   . montelukast (SINGULAIR) 10 MG tablet Take 1 tablet (10 mg  total) by mouth at bedtime.  Marland Kitchen omeprazole (PRILOSEC) 40 MG capsule Take 1 capsule (40 mg total) by mouth 2 (two) times daily.  . sertraline (ZOLOFT) 50 MG tablet Take 1 tablet (50 mg total) by mouth at bedtime.  . triamcinolone (NASACORT ALLERGY 24HR) 55 MCG/ACT AERO nasal inhaler Place 1 spray into the nose daily as needed (congestion/allergies.).  . [DISCONTINUED] lisinopril (ZESTRIL) 30 MG tablet Take 1 tablet (30 mg total) by mouth at bedtime.  . [DISCONTINUED] omeprazole (PRILOSEC) 40 MG capsule Take 1 capsule (40 mg total) by mouth 2 (two) times daily.  . [DISCONTINUED] sertraline (ZOLOFT) 50 MG tablet Take 1 tablet (50 mg total) by mouth at bedtime.  . [DISCONTINUED] SYMBICORT 160-4.5 MCG/ACT inhaler SMARTSIG:2 Puff(s) Via Inhaler Every Morning    Review of Systems: Cardiovascular: negative for chest pain, palpitations, leg swelling, orthopnea Respiratory: negative for SOB, wheezing or persistent cough Gastrointestinal: negative for abdominal pain Genitourinary: negative for dysuria or gross hematuria  Objective  Vitals: BP 138/80   Pulse 73   Temp 97.6 F (36.4 C) (Temporal)   Ht 5\' 7"  (1.702 m)   Wt 251 lb 3.2 oz (113.9 kg)   SpO2 97%   BMI 39.34 kg/m  General: no acute distress  Psych:  Alert and oriented, normal mood and affect HEENT:  Normocephalic, atraumatic, supple neck  Cardiovascular:  RRR without murmur. no edema Respiratory:  Good breath sounds bilaterally, CTAB with normal respiratory effort Skin:  Warm, no rashes, well-healed surgical scars right axilla and right breast Neurologic:   Mental status is normal  Commons side effects, risks, benefits, and alternatives for medications and treatment plan prescribed today were discussed, and the patient expressed understanding of the given instructions. Patient is instructed to call or message via MyChart if he/she has any questions or concerns regarding our treatment plan. No barriers to understanding were identified.  We discussed Red Flag symptoms and signs in detail.  Patient expressed understanding regarding what to do in case of urgent or emergency type symptoms.   Medication list was reconciled, printed and provided to the patient in AVS. Patient instructions and summary information was reviewed with the patient as documented in the AVS. This note was prepared with assistance of Dragon voice recognition software. Occasional wrong-word or sound-a-like substitutions may have occurred due to the inherent limitations of voice recognition software  This visit occurred during the SARS-CoV-2 public health emergency.  Safety protocols were in place, including screening questions prior to the visit, additional usage of staff PPE, and extensive cleaning of exam room while observing appropriate contact time as indicated for disinfecting solutions.

## 2020-03-07 NOTE — Patient Instructions (Signed)
Please return in 6 months for your annual complete physical; please come fasting. Please schedule lab visit for around October 25th.  I've ordered the wixela for you.   I'm proud of you!! You've done well with a hard diagnosis.   If you have any questions or concerns, please don't hesitate to send me a message via MyChart or call the office at [phone]. Thank you for visiting with [place] today! It's our pleasure caring for you.

## 2020-03-11 NOTE — Progress Notes (Signed)
  Radiation Oncology         [phone] ________________________________  Name: Teresa Dunn MRN: [id number]  Date: 02/23/2020  DOB: Nov 11, 1965  End of Treatment Note  Diagnosis:   right-sided breast cancer     Indication for treatment:  Curative       Radiation treatment dates:   01/06/2020 through 02/23/2020  Site/dose:   The patient initially received a dose of 50.4 Gy in 28 fractions to the breast using whole-breast tangent fields. This was delivered using a 3-D conformal technique. The patient then received a boost to the seroma. This delivered an additional 10 Gy in 5 fractions using a 3 field photon boost technique. The total dose was 60.4 Gy.   Narrative: The patient tolerated radiation treatment relatively well.   The patient had some expected skin irritation as she progressed during treatment.    Plan: The patient has completed radiation treatment. The patient will return to radiation oncology clinic for routine followup in one month. I advised the patient to call or return sooner if they have any questions or concerns related to their recovery or treatment. ________________________________  Jodelle Gross, M.D., Ph.D.

## 2020-03-15 ENCOUNTER — Encounter: Payer: Self-pay | Admitting: Oncology

## 2020-03-15 ENCOUNTER — Other Ambulatory Visit: Payer: BC Managed Care – PPO

## 2020-03-15 ENCOUNTER — Ambulatory Visit (HOSPITAL_BASED_OUTPATIENT_CLINIC_OR_DEPARTMENT_OTHER): Payer: BC Managed Care – PPO | Admitting: Oncology

## 2020-03-15 DIAGNOSIS — Z170 Estrogen receptor positive status [ER+]: Secondary | ICD-10-CM

## 2020-03-15 DIAGNOSIS — C50411 Malignant neoplasm of upper-outer quadrant of right female breast: Secondary | ICD-10-CM

## 2020-03-15 NOTE — Progress Notes (Signed)
[facility]  [phone] [phone]     ID: Teresa Dunn DOB: 1965-09-04  MR#: [id number]  ZHY#:[id number]  Patient Care Team: Leamon Arnt, MD as PCP - General (Family Medicine) Erroll Luna, MD as Consulting Physician (General Surgery) Oaklee Esther, Virgie Dad, MD as Consulting Physician (Oncology) Kyung Rudd, MD as Consulting Physician (Radiation Oncology) Mauro Kaufmann, RN as Oncology Nurse Navigator Rockwell Germany, RN as Oncology Nurse Navigator Chauncey Cruel, MD OTHER MD:  CHIEF COMPLAINT: Estrogen receptor positive breast cancer  CURRENT TREATMENT:    INTERVAL HISTORY: Teresa Dunn was scheduled today for follow up of her estrogen receptor positive breast cancer. However she did no-show for her appointment  Since consultation, she underwent bilateral lumpectomies on 12/03/2019 under Dr. Brantley Stage. Pathology from the procedure [id number]) showed: right breast-- invasive ductal carcinoma, grade 1, 1.3 cm; ductal carcinoma in situ, intermediate grade; negative resection margins.  Both the left breast and the biopsied right axillary lymph node were negative for carcinoma.  Oncotype DX was obtained on the final surgical sample and the recurrence score of 14 predicts a risk of recurrence outside the breast over the next 9 years of 4%, if the patient's only systemic therapy is an antiestrogen for 5 years.  It also predicts no significant benefit from chemotherapy.  She was referred back to Dr. Lisbeth Renshaw on 12/30/2019 to discuss radiation therapy. She received treatment from 01/06/2020 through 02/23/2020.   REVIEW OF SYSTEMS: Amyla    HISTORY OF CURRENT ILLNESS: From the original intake note:  Teresa Dunn had routine screening mammography on 10/26/2019 showing a possible mass in the right breast and possible calcifications in the left breast. She underwent bilateral diagnostic mammography with tomography and right breast  ultrasonography at [facility] on 11/04/2019 showing: breast density category B; 9 mm right breast mass at 11:30; grouped calcifications in inferior central left breast; no right axillary adenopathy.  Accordingly on 11/09/2019 she proceeded to biopsy of the right breast area in question. The pathology from this procedure ([id number]) showed: invasive ductal carcinoma, grade 1. Prognostic indicators significant for: estrogen receptor, 100% positive and progesterone receptor, 90% positive, both with strong staining intensity. Proliferation marker Ki67 at 10%. HER2 negative by immunohistochemistry (1+).  Biopsy of the left breast calcifications was performed on 11/11/2019. Pathology [id number]) showed: complex sclerosing lesion with usual ductal hyperplasia and calcifications; fibrocystic changes with usual ductal hyperplasia and calcifications.  The patient's subsequent history is as detailed below.   PAST MEDICAL HISTORY: Past Medical History:  Diagnosis Date  . Anxiety   . Arthritis   . Asthma   . Cervical dysplasia 1990   s/p cryotherapy with normal f/u paps  . Depression   . Family history of breast cancer   . 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 Quit 2016  . Genital warts   . GERD (gastroesophageal reflux disease)   . History of cardiac murmur    per pt, mild and no cardiologist  . Hypertension   . PONV (postoperative nausea and vomiting)   . Primary osteoarthritis of right knee 09/02/2019    PAST SURGICAL HISTORY: Past Surgical History:  Procedure Laterality Date  . BREAST BIOPSY Right 11/09/2019  . BREAST LUMPECTOMY WITH RADIOACTIVE SEED AND SENTINEL LYMPH NODE BIOPSY Bilateral 12/03/2019   Procedure: BILATERAL BREAST LUMPECTOMY WITH RADIOACTIVE SEED AND RIGHT SENTINEL LYMPH NODE MAPPING;  Surgeon: Erroll Luna, MD;  Location: [facility];  Service: General;  Laterality:  Bilateral;  PEC BLOCK  . Helix   with  normal follow up paps  . KNEE SURGERY    . SHOULDER ARTHROSCOPY WITH SUBACROMIAL DECOMPRESSION Left 11/18/2014   Procedure: LEFT SHOULDER ARTHROSCOPY WITH SUBACROMIAL DECOMPRESSION/DEBRIDEMENT LABREAL/MINI DISTAL CLAVICLE RESECTION;  Surgeon: Susa Day, MD;  Location: [facility];  Service: Orthopedics;  Laterality: Left;  . TUBAL LIGATION      FAMILY HISTORY: Family History  Problem Relation Age of Onset  . Heart disease Mother   . Hypertension Mother   . Diabetes Mother   . Lung cancer Mother   . Bipolar disorder Mother   . Alcohol abuse Mother   . Heart disease Father   . Hypertension Father   . Diabetes Father   . Asthma Father   . Stroke Maternal Grandmother   . Breast cancer Maternal Grandmother   . ADD / ADHD Son   . Healthy Son   . Skin cancer Sister   . Cervical cancer Sister   . Healthy Brother   . Breast cancer Paternal Grandmother   . Cancer Maternal Aunt        unknown   . Lung cancer Maternal Grandfather   . Pneumonia Paternal Uncle   . Colon cancer Neg Hx   The patient's father is 56 years old as of June 2021.  His mother had breast cancer and died in her early 55s.  The patient's father had 3 brothers no sisters and they had no cancer.  The patient's mother died at age 59 with lung cancer.  Her mother had breast cancer and died in her 18s.  The patient's mother had 2 sisters and 2 brothers who did not have cancer.  The patient herself has 1 brother and 1 sister with no history of cancer.   GYNECOLOGIC HISTORY:  No LMP recorded. (Menstrual status: Perimenopausal). Menarche: 54 years old Age at first live birth: 55 years old [place] P 2 LMP January 2021 HRT n/a  Hysterectomy? no BSO? 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  Her husband Jeneen Rinks works for [employer] first which is a Administrator, Civil Service.  Daughter Teresa Dunn, 85 lives in [place] and works in an ophthalmologist's office as a  Merchant navy officer.  Son Teresa Dunn, 20, works as a Aeronautical engineer for [employer].  The patient has 1 grandchild.  She is not a Ambulance person.Marland Kitchen    ADVANCED DIRECTIVES: In the absence of any documentation to the contrary, the patient's spouse is their HCPOA.    HEALTH MAINTENANCE: Social History   Tobacco Use  . Smoking status: Former Smoker    Packs/day: 1.50    Years: 30.00    Pack years: 45.00    Types: Cigarettes    Quit date: 04/16/2014    Years since quitting: 5.9  . Smokeless tobacco: Never Used  Vaping Use  . Vaping Use: Never used  Substance Use Topics  . Alcohol use: Yes    Comment: socially   . Drug use: No    Comment: used marijuana in past as a teenager     Colonoscopy: never done, negative Cologuard 04/2019  PAP: 03/2018, negative  Bone density: n/a (age)   Allergies  Allergen Reactions  . Codeine Nausea And Vomiting  . Asa [Aspirin] Other (See Comments)    wheezing    Current Outpatient Medications  Medication Sig Dispense Refill  . albuterol (VENTOLIN HFA) 108 (90 Base)  MCG/ACT inhaler Inhale 1-2 puffs into the lungs every 4 (four) hours as needed for wheezing or shortness of breath. 18 g 5  . clobetasol cream (TEMOVATE) 3.53 % Apply 1 application topically every morning.  3  . Fluticasone-Salmeterol (WIXELA INHUB) 250-50 MCG/DOSE AEPB Inhale 1 puff into the lungs in the morning and at bedtime. 60 each 11  . lisinopril (ZESTRIL) 40 MG tablet Take 1 tablet (40 mg total) by mouth at bedtime. 90 tablet 3  . loratadine (CLARITIN) 10 MG tablet Take 10 mg by mouth daily as needed for allergies.     . montelukast (SINGULAIR) 10 MG tablet Take 1 tablet (10 mg total) by mouth at bedtime. 30 tablet 11  . omeprazole (PRILOSEC) 40 MG capsule Take 1 capsule (40 mg total) by mouth 2 (two) times daily. 180 capsule 3  . sertraline (ZOLOFT) 50 MG tablet Take 1 tablet (50 mg total) by mouth at bedtime. 90 tablet 3  . triamcinolone (NASACORT ALLERGY 24HR) 55 MCG/ACT AERO nasal inhaler  Place 1 spray into the nose daily as needed (congestion/allergies.). 1 Inhaler 11   No current facility-administered medications for this visit.    OBJECTIVE:   There were no vitals filed for this visit.   There is no height or weight on file to calculate BMI.   Wt Readings from Last 3 Encounters:  03/07/20 251 lb 3.2 oz (113.9 kg)  12/30/19 250 lb 6.4 oz (113.6 kg)  12/03/19 248 lb 0.3 oz (112.5 kg)      ECOG FS:1 - Symptomatic but completely ambulatory     LAB RESULTS:  CMP     Component Value Date/Time   NA 138 11/26/2019 1524   K 4.6 11/26/2019 1524   CL 105 11/26/2019 1524   CO2 25 11/26/2019 1524   GLUCOSE 98 11/26/2019 1524   BUN 13 11/26/2019 1524   CREATININE 0.71 11/26/2019 1524   CALCIUM 9.5 11/26/2019 1524   PROT 7.3 11/26/2019 1524   ALBUMIN 4.0 11/26/2019 1524   AST 17 11/26/2019 1524   ALT 18 11/26/2019 1524   ALKPHOS 107 11/26/2019 1524   BILITOT 0.5 11/26/2019 1524   GFRNONAA >60 11/26/2019 1524   GFRAA >60 11/26/2019 1524    No results found for: TOTALPROTELP, ALBUMINELP, A1GS, A2GS, BETS, BETA2SER, GAMS, MSPIKE, SPEI  Lab Results  Component Value Date   WBC 6.4 11/26/2019   NEUTROABS 4.2 11/26/2019   HGB 12.2 11/26/2019   HCT 36.7 11/26/2019   MCV 89.1 11/26/2019   PLT 268 11/26/2019    No results found for: LABCA2  No components found for: GDJMEQ683  No results for input(s): INR in the last 168 hours.  No results found for: LABCA2  No results found for: MHD622  No results found for: WLN989  No results found for: QJJ941  No results found for: CA2729  No components found for: HGQUANT  No results found for: CEA1 / No results found for: CEA1   No results found for: AFPTUMOR  No results found for: CHROMOGRNA  No results found for: KPAFRELGTCHN, LAMBDASER, KAPLAMBRATIO (kappa/lambda light chains)  No results found for: HGBA, HGBA2QUANT, HGBFQUANT, HGBSQUAN (Hemoglobinopathy evaluation)   No results found for: LDH  No  results found for: IRON, TIBC, IRONPCTSAT (Iron and TIBC)  No results found for: FERRITIN  Urinalysis    Component Value Date/Time   COLORURINE STRAW (A) 05/25/2016 [place] 05/25/2016 1153   LABSPEC 1.006 05/25/2016 1153   PHURINE 6.0 05/25/2016 1153  GLUCOSEU NEGATIVE 05/25/2016 [place] 05/25/2016 1153   BILIRUBINUR Negative 02/11/2019 1411   KETONESUR NEGATIVE 05/25/2016 1153   PROTEINUR Negative 02/11/2019 1411   PROTEINUR NEGATIVE 05/25/2016 1153   UROBILINOGEN 0.2 02/11/2019 1411   NITRITE Negative 02/11/2019 1411   NITRITE NEGATIVE 05/25/2016 1153   LEUKOCYTESUR Negative 02/11/2019 1411     STUDIES: No results found.   ELIGIBLE FOR AVAILABLE RESEARCH PROTOCOL: AET  ASSESSMENT: 54 y.o. [place] woman status post bilateral breast biopsies both 11/09/2019 showing  (a) on the left, a complex sclerosing lesion  (b) on the right, a clinical T1b N0, stage IA invasive ductal carcinoma, grade 1, estrogen and progesterone receptor positive, HER-2 not amplified, with an MIB-1 of 10%.  (1) status post bilateral lumpectomies with right sentinel lymph node sampling 12/03/2019, showing  (a) on the left, fibrocystic change  (b) on the right, a pT1c pN0, stage IA invasive ductal carcinoma, grade 1, with negative margins  (c) a total of 1 right axillary lymph node was removed  (2) adjuvant radiation 01/06/2020 through 02/23/2020 Site/dose:   The patient initially received a dose of 50.4 Gy in 28 fractions to the breast using whole-breast tangent fields. This was delivered using a 3-D conformal technique. The patient then received a boost to the seroma. This delivered an additional 10 Gy in 5 fractions using a 3 field photon boost technique. The total dose was 60.4 Gy.  (3) to start tamoxifen at the completion of local treatment.   PLAN: Camry did not show for her appointment on 03/15/2020. Follow-up letter has been sent  Sarajane Jews C. Jachob Mcclean, MD  03/15/2020 4:10 PM Medical Oncology and Hematology [facility] [place], [place] [address] Tel. [phone]    Fax. [phone]   This document serves as a record of services personally performed by Lurline Del, MD. It was created on his behalf by Wilburn Mylar, a trained medical scribe. The creation of this record is based on the scribe's personal observations and the provider's statements to them.   I, Lurline Del MD, have reviewed the above documentation for accuracy and completeness, and I agree with the above.   *Total Encounter Time as defined by the Centers for Medicare and Medicaid Services includes, in addition to the face-to-face time of a patient visit (documented in the note above) non-face-to-face time: obtaining and reviewing outside history, ordering and reviewing medications, tests or procedures, care coordination (communications with other health care professionals or caregivers) and documentation in the medical record.

## 2020-03-23 ENCOUNTER — Telehealth: Payer: Self-pay | Admitting: Radiation Oncology

## 2020-03-23 NOTE — Telephone Encounter (Signed)
  Radiation Oncology         [phone]) [phone] ________________________________  Name: Teresa Dunn MRN: [id number]  Date of Service: 03/23/2020  DOB: 07-18-1965  Post Treatment Telephone Note  Diagnosis:  Stage IA, pT1bN0M0 grade 1 ER/PR positive invasive ductal carcinoma of the right breast.  Interval Since Last Radiation: 4 weeks   01/06/2020 through 02/23/2020: The patient initially received a dose of 50.4 Gy in 28 fractions to the breast using whole-breast tangent fields. This was delivered using a 3-D conformal technique. The patient then received a boost to the seroma. This delivered an additional 10 Gy in 5 fractions using a 3 field photon boost technique. The total dose was 60.4 Gy.  Narrative:  The patient was contacted today for routine follow-up. During treatment she did very well with radiotherapy and did not have significant desquamation.   Impression/Plan: 1. Stage IA, pT1bN0M0 grade 1 ER/PR positive invasive ductal carcinoma of the right breast. I was unable to reach her but left a voicemail and on it, I  discussed that we would be happy to continue to follow her as needed, but she will also continue to follow up with Dr. Jana Hakim in medical oncology. She was counseled on skin care as well as measures to avoid sun exposure to this area.      Carola Rhine, PAC

## 2020-04-11 ENCOUNTER — Other Ambulatory Visit: Payer: Self-pay

## 2020-04-11 ENCOUNTER — Other Ambulatory Visit: Payer: BC Managed Care – PPO

## 2020-04-11 ENCOUNTER — Other Ambulatory Visit: Payer: Self-pay | Admitting: Family Medicine

## 2020-04-11 DIAGNOSIS — Z01818 Encounter for other preprocedural examination: Secondary | ICD-10-CM

## 2020-04-11 DIAGNOSIS — I10 Essential (primary) hypertension: Secondary | ICD-10-CM

## 2020-04-12 ENCOUNTER — Other Ambulatory Visit: Payer: Self-pay | Admitting: Family Medicine

## 2020-04-12 LAB — COMPLETE METABOLIC PANEL WITH GFR
AG Ratio: 1.7 (calc) (ref 1.0–2.5)
ALT: 17 U/L (ref 6–29)
AST: 15 U/L (ref 10–35)
Albumin: 4.2 g/dL (ref 3.6–5.1)
Alkaline phosphatase (APISO): 86 U/L (ref 37–153)
BUN: 11 mg/dL (ref 7–25)
CO2: 26 mmol/L (ref 20–32)
Calcium: 9.2 mg/dL (ref 8.6–10.4)
Chloride: 103 mmol/L (ref 98–110)
Creat: 0.66 mg/dL (ref 0.50–1.05)
GFR, Est African American: 117 mL/min/{1.73_m2} (ref >=60)
GFR, Est Non African American: 101 mL/min/{1.73_m2} (ref >=60)
Globulin: 2.5 g/dL (calc) (ref 1.9–3.7)
Glucose, Bld: 102 mg/dL — ABNORMAL HIGH (ref 65–99)
Potassium: 4.1 mmol/L (ref 3.5–5.3)
Sodium: 138 mmol/L (ref 135–146)
Total Bilirubin: 0.4 mg/dL (ref 0.2–1.2)
Total Protein: 6.7 g/dL (ref 6.1–8.1)

## 2020-04-12 LAB — CBC WITH DIFFERENTIAL/PLATELET
Absolute Monocytes: 387 cells/uL (ref 200–950)
Basophils Absolute: 31 cells/uL (ref 0–200)
Basophils Relative: 0.7 %
Eosinophils Absolute: 88 cells/uL (ref 15–500)
Eosinophils Relative: 2 %
HCT: 38 % (ref 35.0–45.0)
Hemoglobin: 12.5 g/dL (ref 11.7–15.5)
Lymphs Abs: 708 cells/uL — ABNORMAL LOW (ref 850–3900)
MCH: 29.6 pg (ref 27.0–33.0)
MCHC: 32.9 g/dL (ref 32.0–36.0)
MCV: 90 fL (ref 80.0–100.0)
MPV: 11.2 fL (ref 7.5–12.5)
Monocytes Relative: 8.8 %
Neutro Abs: 3186 cells/uL (ref 1500–7800)
Neutrophils Relative %: 72.4 %
Platelets: 287 10*3/uL (ref 140–400)
RBC: 4.22 10*6/uL (ref 3.80–5.10)
RDW: 12.6 % (ref 11.0–15.0)
Total Lymphocyte: 16.1 %
WBC: 4.4 10*3/uL (ref 3.8–10.8)

## 2020-04-14 NOTE — Progress Notes (Signed)
Labs are ok. ? Ordered for preop clearance? thanks

## 2020-05-18 ENCOUNTER — Other Ambulatory Visit: Payer: Self-pay | Admitting: Family Medicine

## 2020-06-22 ENCOUNTER — Telehealth (INDEPENDENT_AMBULATORY_CARE_PROVIDER_SITE_OTHER): Payer: BC Managed Care – PPO | Admitting: Family Medicine

## 2020-06-22 ENCOUNTER — Encounter: Payer: Self-pay | Admitting: Family Medicine

## 2020-06-22 ENCOUNTER — Other Ambulatory Visit: Payer: Self-pay

## 2020-06-22 DIAGNOSIS — Z96652 Presence of left artificial knee joint: Secondary | ICD-10-CM

## 2020-06-22 DIAGNOSIS — U071 COVID-19: Secondary | ICD-10-CM

## 2020-06-22 HISTORY — DX: Presence of left artificial knee joint: Z96.652

## 2020-06-22 MED ORDER — ALBUTEROL SULFATE HFA 108 (90 BASE) MCG/ACT IN AERS: 1.0000 | INHALATION_SPRAY | RESPIRATORY_TRACT | 5 refills | Status: DC | PRN

## 2020-06-22 NOTE — Progress Notes (Signed)
Virtual Visit via Video Note  Subjective  CC:  Chief Complaint  Patient presents with  . Covid Exposure    Has not been tested for COVID, husband is COVID positive (results on 06/18/20)     I connected with [name] on 06/22/20 at  3:30 PM EST by a video enabled telemedicine application and verified that I am speaking with the correct person using two identifiers. Location patient: Home Location provider: [facility], Office Persons participating in the virtual visit: Doranna Czerwonka, Leamon Arnt, MD Reymundo Poll CMA  I discussed the limitations of evaluation and management by telemedicine and the availability of in person appointments. The patient expressed understanding and agreed to proceed. HPI: Teresa Dunn is a 55 y.o. female who was contacted today to address the problems listed above in the chief complaint. . Fully vaccinated 55 yo with asthma with likely covid 19 infection: sxs started 5-6 days ago. Husband has covid; his sxs started 8 days ago and he has had a positive test. Pt with typical sxs: nasal congestion, frontal headache, decreased sense of taste and smell, decreased appetite, mild diarrhea.  He has some malaise and myalgias.  Has cough without shortness of breath at rest.  She does have intermittent wheezing and this has been responsive to her Ventolin inhaler.  She needs a refill.  No dyspnea with exertion.  No abdominal pain.  No pleuritic chest pain.  Having low-grade fevers.   Assessment  1. COVID-19 virus infection      Plan   COVID-19: Patient is self isolating.  She is vaccinated.  Symptoms are mild and likely due to the omicron variant.  Supportive care discussed in detail.  Continue cough and cold medicines.  Refilled Ventolin for use as needed for tightness in chest or wheezing.  Patient will monitor her respiratory status.  Currently she is stable.  Hydrate and rest.  Symptoms should improve over  the next 5 to 7 days.  She'll follow-up with me if is not.   I discussed the assessment and treatment plan with the patient. The patient was provided an opportunity to ask questions and all were answered. The patient agreed with the plan and demonstrated an understanding of the instructions.   The patient was advised to call back or seek an in-person evaluation if the symptoms worsen or if the condition fails to improve as anticipated. Follow up: For complete physical 09/02/2020  Meds ordered this encounter  Medications  . albuterol (VENTOLIN HFA) 108 (90 Base) MCG/ACT inhaler    Sig: Inhale 1-2 puffs into the lungs every 4 (four) hours as needed for wheezing or shortness of breath.    Dispense:  18 g    Refill:  5      I reviewed the patients updated PMH, FH, and SocHx.    Patient Active Problem List   Diagnosis Date Noted  . Malignant neoplasm of upper-outer quadrant of right breast in female, estrogen receptor positive ([place]) 11/18/2019    Priority: High  . Obesity (BMI 30-39.9) 03/27/2018    Priority: High  . Essential hypertension 02/25/2018    Priority: High  . Former heavy tobacco smoker 02/25/2018    Priority: High  . Generalized anxiety disorder 10/10/2010    Priority: High  . Moderate persistent asthma 02/25/2018    Priority: Medium  . Chronic mixed headache syndrome 08/07/2012    Priority: Medium  . GERD (gastroesophageal reflux disease) 06/26/2012  Priority: Medium  . Major depression, recurrent, chronic (HCC) 10/23/2010    Priority: Medium  . Primary osteoarthritis of right knee 09/02/2019    Priority: Low  . Lipoma of abdominal wall 02/25/2018    Priority: Low  . Plantar fasciitis of left foot 11/30/2016    Priority: Low  . Perennial allergic rhinitis 08/07/2012    Priority: Low  . History of total knee replacement, left 06/22/2020  . Family history of breast cancer    Current Meds  Medication Sig  . clobetasol cream (TEMOVATE) 0.05 % Apply 1  application topically every morning.  . Fluticasone-Salmeterol (WIXELA INHUB) 250-50 MCG/DOSE AEPB Inhale 1 puff into the lungs in the morning and at bedtime.  Marland Kitchen lisinopril (ZESTRIL) 40 MG tablet Take 1 tablet (40 mg total) by mouth at bedtime.  Marland Kitchen loratadine (CLARITIN) 10 MG tablet Take 10 mg by mouth daily as needed for allergies.   . montelukast (SINGULAIR) 10 MG tablet TAKE 1 TABLET(10 MG) BY MOUTH AT BEDTIME  . omeprazole (PRILOSEC) 40 MG capsule Take 1 capsule (40 mg total) by mouth 2 (two) times daily.  . sertraline (ZOLOFT) 50 MG tablet Take 1 tablet (50 mg total) by mouth at bedtime.  . triamcinolone (NASACORT ALLERGY 24HR) 55 MCG/ACT AERO nasal inhaler Place 1 spray into the nose daily as needed (congestion/allergies.).  . [DISCONTINUED] albuterol (VENTOLIN HFA) 108 (90 Base) MCG/ACT inhaler Inhale 1-2 puffs into the lungs every 4 (four) hours as needed for wheezing or shortness of breath.  . [DISCONTINUED] SYMBICORT 160-4.5 MCG/ACT inhaler INHALE 2 PUFFS INTO THE LUNGS EVERY MORNING    Allergies: Patient is allergic to codeine and asa [aspirin]. Family History: Patient family history includes ADD / ADHD in her son; Alcohol abuse in her mother; Asthma in her father; Bipolar disorder in her mother; Breast cancer in her maternal grandmother and paternal grandmother; Cancer in her maternal aunt; Cervical cancer in her sister; Diabetes in her father and mother; Healthy in her brother and son; Heart disease in her father and mother; Hypertension in her father and mother; Lung cancer in her maternal grandfather and mother; Pneumonia in her paternal uncle; Skin cancer in her sister; Stroke in her maternal grandmother. Social History:  Patient  reports that she quit smoking about 6 years ago. Her smoking use included cigarettes. She has a 45.00 pack-year smoking history. She has never used smokeless tobacco. She reports current alcohol use. She reports that she does not use drugs.  Review of  Systems: Constitutional: Negative for fever malaise or anorexia Cardiovascular: negative for chest pain Respiratory: negative for SOB or persistent cough Gastrointestinal: negative for abdominal pain  OBJECTIVE Vitals: There were no vitals taken for this visit.  Reports afebrile General: no acute distress , A&Ox3, occasional cough.  No respiratory distress.  Speaking full sentences.  Appears nontoxic  Willow Ora, MD

## 2020-09-02 ENCOUNTER — Other Ambulatory Visit: Payer: Self-pay

## 2020-09-02 ENCOUNTER — Ambulatory Visit (INDEPENDENT_AMBULATORY_CARE_PROVIDER_SITE_OTHER): Payer: BC Managed Care – PPO | Admitting: Family Medicine

## 2020-09-02 ENCOUNTER — Encounter: Payer: Self-pay | Admitting: Family Medicine

## 2020-09-02 VITALS — BP 128/78 | HR 70 | Temp 98.0°F | Resp 16 | Ht 67.0 in | Wt 245.0 lb

## 2020-09-02 DIAGNOSIS — F411 Generalized anxiety disorder: Secondary | ICD-10-CM | POA: Diagnosis not present

## 2020-09-02 DIAGNOSIS — I10 Essential (primary) hypertension: Secondary | ICD-10-CM | POA: Diagnosis not present

## 2020-09-02 DIAGNOSIS — Z170 Estrogen receptor positive status [ER+]: Secondary | ICD-10-CM

## 2020-09-02 DIAGNOSIS — C50411 Malignant neoplasm of upper-outer quadrant of right female breast: Secondary | ICD-10-CM

## 2020-09-02 DIAGNOSIS — K219 Gastro-esophageal reflux disease without esophagitis: Secondary | ICD-10-CM

## 2020-09-02 DIAGNOSIS — Z0000 Encounter for general adult medical examination without abnormal findings: Secondary | ICD-10-CM | POA: Diagnosis not present

## 2020-09-02 DIAGNOSIS — E669 Obesity, unspecified: Secondary | ICD-10-CM

## 2020-09-02 DIAGNOSIS — F339 Major depressive disorder, recurrent, unspecified: Secondary | ICD-10-CM | POA: Diagnosis not present

## 2020-09-02 DIAGNOSIS — J4540 Moderate persistent asthma, uncomplicated: Secondary | ICD-10-CM

## 2020-09-02 LAB — COMPREHENSIVE METABOLIC PANEL
ALT: 21 U/L (ref 0–35)
AST: 18 U/L (ref 0–37)
Albumin: 4.2 g/dL (ref 3.5–5.2)
Alkaline Phosphatase: 102 U/L (ref 39–117)
BUN: 17 mg/dL (ref 6–23)
CO2: 26 mEq/L (ref 19–32)
Calcium: 9.6 mg/dL (ref 8.4–10.5)
Chloride: 99 mEq/L (ref 96–112)
Creatinine, Ser: 0.74 mg/dL (ref 0.40–1.20)
GFR: 92.38 mL/min (ref >60.00)
Glucose, Bld: 106 mg/dL — ABNORMAL HIGH (ref 70–99)
Potassium: 5.2 mEq/L — ABNORMAL HIGH (ref 3.5–5.1)
Sodium: 134 mEq/L — ABNORMAL LOW (ref 135–145)
Total Bilirubin: 0.8 mg/dL (ref 0.2–1.2)
Total Protein: 7 g/dL (ref 6.0–8.3)

## 2020-09-02 LAB — LIPID PANEL
Cholesterol: 202 mg/dL — ABNORMAL HIGH (ref 0–200)
HDL: 97.6 mg/dL (ref >39.00)
LDL Cholesterol: 95 mg/dL (ref 0–99)
NonHDL: 104.72
Total CHOL/HDL Ratio: 2
Triglycerides: 49 mg/dL (ref 0.0–149.0)
VLDL: 9.8 mg/dL (ref 0.0–40.0)

## 2020-09-02 LAB — CBC WITH DIFFERENTIAL/PLATELET
Basophils Absolute: 0 10*3/uL (ref 0.0–0.1)
Basophils Relative: 0.8 % (ref 0.0–3.0)
Eosinophils Absolute: 0.1 10*3/uL (ref 0.0–0.7)
Eosinophils Relative: 1.5 % (ref 0.0–5.0)
HCT: 39.1 % (ref 36.0–46.0)
Hemoglobin: 13 g/dL (ref 12.0–15.0)
Lymphocytes Relative: 18.2 % (ref 12.0–46.0)
Lymphs Abs: 0.7 10*3/uL (ref 0.7–4.0)
MCHC: 33.4 g/dL (ref 30.0–36.0)
MCV: 92.5 fl (ref 78.0–100.0)
Monocytes Absolute: 0.4 10*3/uL (ref 0.1–1.0)
Monocytes Relative: 9.7 % (ref 3.0–12.0)
Neutro Abs: 2.8 10*3/uL (ref 1.4–7.7)
Neutrophils Relative %: 69.8 % (ref 43.0–77.0)
Platelets: 257 10*3/uL (ref 150.0–400.0)
RBC: 4.23 Mil/uL (ref 3.87–5.11)
RDW: 13.6 % (ref 11.5–15.5)
WBC: 4 10*3/uL (ref 4.0–10.5)

## 2020-09-02 LAB — TSH: TSH: 1.32 u[IU]/mL (ref 0.35–4.50)

## 2020-09-02 MED ORDER — OMEPRAZOLE 40 MG PO CPDR: 40.0000 mg | DELAYED_RELEASE_CAPSULE | Freq: Every day | ORAL | 3 refills | Status: DC

## 2020-09-02 NOTE — Progress Notes (Signed)
Subjective  Chief Complaint  Patient presents with  . Annual Exam    Non-fasting   . Health Maintenance    Will start shingrix     HPI: Teresa Dunn is a 55 y.o. female who presents to [facility] today for a Female Wellness Visit. She also has the concerns and/or needs as listed above in the chief complaint. These will be addressed in addition to the Health Maintenance Visit.   Wellness Visit: annual visit with health maintenance review and exam without Pap   Health maintenance: Mammogram and Pap smear up-to-date.  Colorectal cancer screening up-to-date.  Patient feeling well.  No new concerns.  Eligible for Shingrix vaccination Chronic disease f/u and/or acute problem visit: (deemed necessary to be done in addition to the wellness visit):  Hypertension: Continues to be well controlled on lisinopril 40 mg daily.  No adverse effects.  No chest pain, shortness of breath or lower extremity edema.  Generalized anxiety disorder and major depression: Both well controlled on sertraline 50 mg daily.  She has handled the stress of her breast cancer diagnosis and treatment well.  No panic attacks.  No anhedonia.  GERD: Well-controlled on 40 mg Prilosec daily.  No breakthrough symptoms.  No melena.  Moderate persistent asthma on maintenance inhaler with rare use of albuterol.  No cough.  No recent exacerbations.  Breast cancer: Completed treatment in September 2021.  Has follow-up next week.  Assessment  1. Annual physical exam   2. Essential hypertension   3. Generalized anxiety disorder   4. Malignant neoplasm of upper-outer quadrant of right breast in female, estrogen receptor positive ([place])   5. Gastroesophageal reflux disease, unspecified whether esophagitis present   6. Moderate persistent asthma without complication   7. Major depression, recurrent, chronic [facility])      Plan  Female Wellness Visit:  Age appropriate Health Maintenance and  Prevention measures were discussed with patient. Included topics are cancer screening recommendations, ways to keep healthy (see AVS) including dietary and exercise recommendations, regular eye and dental care, use of seat belts, and avoidance of moderate alcohol use and tobacco use.  Screens are up-to-date  BMI: discussed patient's BMI and encouraged positive lifestyle modifications to help get to or maintain a target BMI.  HM needs and immunizations were addressed and ordered. See below for orders. See HM and immunization section for updates.  Shingrix first dose given today.  Second dose will be given in 6 months.  Education counseling given.  Routine labs and screening tests ordered including cmp, cbc and lipids where appropriate.  Discussed recommendations regarding Vit D and calcium supplementation (see AVS)  Chronic disease management visit and/or acute problem visit:  Hypertension: This medical condition is well controlled. There are no signs of complications, medication side effects, or red flags. Patient is instructed to continue the current treatment plan without change in therapies or medications.  Lisinopril 40 mg daily.  Check renal function electrolytes and lipid panel.  GERD: Well-controlled on high-dose PPI.  Has failed lower doses in the past.  No new symptoms.  Continue Prilosec 40 daily.  Mood disorder: Anxiety and depression: In remission.  Continue sertraline 50 mg daily.  Asthma: Well-controlled without recent exacerbations.  No change in medical therapies.  Breast cancer right: Completed treatment.  Doing well.  Has follow-up with oncology soon.  Normal breast exam today   Follow up: Return in about 6 months (around 03/05/2021) for follow up Hypertension.  Orders Placed  This Encounter  Procedures  . CBC with Differential/Platelet  . Comprehensive metabolic panel  . Lipid panel  . TSH  . Hepatitis C antibody   Meds ordered this encounter  Medications  .  omeprazole (PRILOSEC) 40 MG capsule    Sig: Take 1 capsule (40 mg total) by mouth daily.    Dispense:  180 capsule    Refill:  3      Body mass index is 38.37 kg/m. Wt Readings from Last 3 Encounters:  09/02/20 245 lb (111.1 kg)  03/07/20 251 lb 3.2 oz (113.9 kg)  12/30/19 250 lb 6.4 oz (113.6 kg)     Patient Active Problem List   Diagnosis Date Noted  . Malignant neoplasm of upper-outer quadrant of right breast in female, estrogen receptor positive ([place]) 11/18/2019    Priority: High  . Obesity (BMI 30-39.9) 03/27/2018    Priority: High  . Essential hypertension 02/25/2018    Priority: High  . Former heavy tobacco smoker 02/25/2018    Priority: High    1.5 ppd x 30 years. Quit 2016   . Generalized anxiety disorder 10/10/2010    Priority: High    cymbalta - weight gain and hot flushes wellbutrin failed, started zoloft January 2020.    . Moderate persistent asthma 02/25/2018    Priority: Medium  . Chronic mixed headache syndrome 08/07/2012    Priority: Medium  . GERD (gastroesophageal reflux disease) 06/26/2012    Priority: Medium  . Major depression, recurrent, chronic ([place]) 10/23/2010    Priority: Medium  . Primary osteoarthritis of right knee 09/02/2019    Priority: Low  . Lipoma of abdominal wall 02/25/2018    Priority: Low  . Plantar fasciitis of left foot 11/30/2016    Priority: Low  . Perennial allergic rhinitis 08/07/2012    Priority: Low  . History of total knee replacement, left 06/22/2020  . Family history of breast cancer    Health Maintenance  Topic Date Due  . Hepatitis C Screening  Never done  . COVID-19 Vaccine (3 - Pfizer risk 4-dose series) 10/10/2019  . MAMMOGRAM  11/03/2020  . TETANUS/TDAP  06/26/2021  . Fecal DNA (Cologuard)  04/29/2022  . PAP SMEAR-Modifier  03/28/2023  . INFLUENZA VACCINE  Completed  . HIV Screening  Completed  . HPV VACCINES  Aged Out   Immunization History  Administered Date(s) Administered  . Influenza Split  03/26/2012  . Influenza,inj,Quad PF,6+ Mos 02/25/2018, 02/11/2019, 03/07/2020  . Influenza,trivalent, recombinat, inj, PF 03/29/2011  . PFIZER(Purple Top)SARS-COV-2 Vaccination 08/22/2019, 09/12/2019  . Pneumococcal Conjugate-13 06/27/2007  . Tdap 06/27/2011   We updated and reviewed the patient's past history in detail and it is documented below. Allergies: Patient is allergic to codeine and asa [aspirin]. 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 Past Surgical History Patient  has a past surgical history that includes Tubal ligation; Shoulder arthroscopy with subacromial decompression (Left, 11/18/2014); Knee surgery; Cervix lesion destruction (1990); Breast biopsy (Right, 11/09/2019); Breast lumpectomy with radioactive seed and sentinel lymph node biopsy (Bilateral, 12/03/2019); and Joint replacement. Family History: Patient family history includes ADD / ADHD in her son; Alcohol abuse in  her mother; Asthma in her father; Bipolar disorder in her mother; Breast cancer in her maternal grandmother and paternal grandmother; Cancer in her maternal aunt; Cervical cancer in her sister; Diabetes in her father and mother; Healthy in her brother and son; Heart disease in her father and mother; Hypertension in her father and mother; Lung cancer in her maternal grandfather and mother; Pneumonia in her paternal uncle; Skin cancer in her sister; Stroke in her maternal grandmother. Social History:  Patient  reports that she quit  smoking about 6 years ago. Her smoking use included cigarettes. She has a 45.00 pack-year smoking history. She has never used smokeless tobacco. She reports current alcohol use. She reports that she does not use drugs.  Review of Systems: Constitutional: negative for fever or malaise Ophthalmic: negative for photophobia, double vision or loss of vision Cardiovascular: negative for chest pain, dyspnea on exertion, or new LE swelling Respiratory: negative for SOB or persistent cough Gastrointestinal: negative for abdominal pain, change in bowel habits or melena Genitourinary: negative for dysuria or gross hematuria, no abnormal uterine bleeding or disharge Musculoskeletal: negative for new gait disturbance or muscular weakness Integumentary: negative for new or persistent rashes, no breast lumps Neurological: negative for TIA or stroke symptoms Psychiatric: negative for SI or delusions Allergic/Immunologic: negative for hives  Patient Care Team    Relationship Specialty Notifications Start End  Leamon Arnt, MD PCP - General Family Medicine  02/25/18   Erroll Luna, MD Consulting Physician General Surgery  11/17/19   Magrinat, Virgie Dad, MD Consulting Physician Oncology  11/25/19   Kyung Rudd, MD Consulting Physician Radiation Oncology  11/25/19   Mauro Kaufmann, RN Oncology Nurse Navigator   11/27/19   Rockwell Germany, RN Oncology Nurse Navigator   11/27/19     Objective  Vitals: BP 128/78   Pulse 70   Temp 98 F (36.7 C) (Temporal)   Resp 16   Ht 5\' 7"  (1.702 m)   Wt 245 lb (111.1 kg)   SpO2 98%   BMI 38.37 kg/m  General:  Well developed, well nourished, no acute distress  Psych:  Alert and orientedx3,normal mood and affect HEENT:  Normocephalic, atraumatic, non-icteric sclera,  supple neck without adenopathy, mass or thyromegaly Cardiovascular:  Normal S1, S2, RRR without gallop, rub or murmur Respiratory:  Good breath sounds bilaterally, CTAB with normal respiratory  effort Gastrointestinal: normal bowel sounds, soft, non-tender, no noted masses. No HSM MSK: no deformities, contusions. Joints are without erythema or swelling.  Skin:  Warm, no rashes or suspicious lesions noted Neurologic:    Mental status is normal. CN 2-11 are normal. Gross motor and sensory exams are normal. Normal gait. No tremor Breast Exam: No mass, skin retraction or nipple discharge is appreciated in either breast. No axillary adenopathy. Fibrocystic changes are not noted.  Skin changes to the right breast from radiation present.  Lumpectomy and biopsy sites well-healed bilaterally.    Commons side effects, risks, benefits, and alternatives for medications and treatment plan prescribed today were discussed, and the patient expressed understanding of the given instructions. Patient is instructed to call or message via MyChart if he/she has any questions or concerns regarding our treatment plan. No barriers to understanding were identified. We discussed Red Flag symptoms and signs in detail. Patient expressed understanding regarding what to do in case of urgent or emergency type symptoms.   Medication list was reconciled, printed and provided to the patient in AVS. Patient instructions and summary information was reviewed  with the patient as documented in the AVS. This note was prepared with assistance of Dragon voice recognition software. Occasional wrong-word or sound-a-like substitutions may have occurred due to the inherent limitations of voice recognition software  This visit occurred during the SARS-CoV-2 public health emergency.  Safety protocols were in place, including screening questions prior to the visit, additional usage of staff PPE, and extensive cleaning of exam room while observing appropriate contact time as indicated for disinfecting solutions.

## 2020-09-02 NOTE — Patient Instructions (Addendum)
Please return in 6 months for hypertension follow up.   I will release your lab results to you on your MyChart account with further instructions. Please reply with any questions.   Today you were given your 1st of 2 Shingrix vaccination. We will give you your 2nd dose in 6 months at your next visit.   If you have any questions or concerns, please don't hesitate to send me a message via MyChart or call the office at [phone]. Thank you for visiting with [place] today! It's our pleasure caring for you.

## 2020-09-05 ENCOUNTER — Other Ambulatory Visit: Payer: Self-pay | Admitting: Surgery

## 2020-09-05 DIAGNOSIS — Z1231 Encounter for screening mammogram for malignant neoplasm of breast: Secondary | ICD-10-CM

## 2020-09-05 LAB — HEPATITIS C ANTIBODY
Hepatitis C Ab: NONREACTIVE
SIGNAL TO CUT-OFF: 0.02 (ref <1.00)

## 2020-09-06 ENCOUNTER — Telehealth: Payer: Self-pay

## 2020-09-06 MED ORDER — ALPRAZOLAM 0.5 MG PO TABS: 0.5000 mg | ORAL_TABLET | Freq: Every day | ORAL | 0 refills | Status: AC | PRN

## 2020-09-06 NOTE — Telephone Encounter (Signed)
Patient aware that script was sent to [facility]

## 2020-09-06 NOTE — Telephone Encounter (Signed)
Pt states she forgot to ask Dr. Jonni Sanger for a xanax prescription at her appt. She states she is going to [place] next month and has never been out of the country. Believes she may need something to help with her anxiety

## 2020-09-06 NOTE — Telephone Encounter (Signed)
Please advise 

## 2020-09-16 HISTORY — PX: KNEE ARTHROSCOPY W/ MENISCECTOMY: SHX1879

## 2020-10-27 ENCOUNTER — Ambulatory Visit: Payer: BC Managed Care – PPO

## 2020-11-10 ENCOUNTER — Other Ambulatory Visit: Payer: Self-pay

## 2020-11-10 ENCOUNTER — Ambulatory Visit
Admission: RE | Admit: 2020-11-10 | Discharge: 2020-11-10 | Disposition: A | Discharge status: Home / Self Care | Payer: BC Managed Care – PPO | Source: Ambulatory Visit | Attending: Surgery | Admitting: Surgery

## 2020-11-10 DIAGNOSIS — Z1231 Encounter for screening mammogram for malignant neoplasm of breast: Secondary | ICD-10-CM

## 2020-11-15 ENCOUNTER — Other Ambulatory Visit: Payer: Self-pay | Admitting: Surgery

## 2020-11-15 ENCOUNTER — Other Ambulatory Visit: Payer: Self-pay | Admitting: Family Medicine

## 2020-11-15 DIAGNOSIS — Z853 Personal history of malignant neoplasm of breast: Secondary | ICD-10-CM

## 2020-11-16 ENCOUNTER — Other Ambulatory Visit: Payer: Self-pay | Admitting: Family Medicine

## 2020-11-16 DIAGNOSIS — Z9889 Other specified postprocedural states: Secondary | ICD-10-CM

## 2020-11-21 ENCOUNTER — Ambulatory Visit
Admission: RE | Admit: 2020-11-21 | Discharge: 2020-11-21 | Disposition: A | Discharge status: Home / Self Care | Payer: BC Managed Care – PPO | Source: Ambulatory Visit | Attending: Family Medicine | Admitting: Family Medicine

## 2020-11-21 ENCOUNTER — Other Ambulatory Visit: Payer: Self-pay

## 2020-11-21 DIAGNOSIS — Z9889 Other specified postprocedural states: Secondary | ICD-10-CM

## 2020-11-21 HISTORY — DX: Personal history of irradiation: Z92.3

## 2020-12-26 ENCOUNTER — Encounter (HOSPITAL_COMMUNITY): Payer: Self-pay

## 2021-02-27 ENCOUNTER — Other Ambulatory Visit: Payer: Self-pay | Admitting: Family Medicine

## 2021-03-08 ENCOUNTER — Other Ambulatory Visit: Payer: Self-pay | Admitting: Family Medicine

## 2021-03-08 ENCOUNTER — Ambulatory Visit: Payer: BC Managed Care – PPO | Admitting: Family Medicine

## 2021-03-08 ENCOUNTER — Encounter: Payer: Self-pay | Admitting: Family Medicine

## 2021-03-08 ENCOUNTER — Other Ambulatory Visit: Payer: Self-pay

## 2021-03-08 VITALS — BP 120/84 | HR 67 | Temp 97.9°F | Ht 67.0 in | Wt 255.8 lb

## 2021-03-08 DIAGNOSIS — Z79899 Other long term (current) drug therapy: Secondary | ICD-10-CM | POA: Diagnosis not present

## 2021-03-08 DIAGNOSIS — Z23 Encounter for immunization: Secondary | ICD-10-CM | POA: Diagnosis not present

## 2021-03-08 DIAGNOSIS — Z9889 Other specified postprocedural states: Secondary | ICD-10-CM

## 2021-03-08 DIAGNOSIS — Z87891 Personal history of nicotine dependence: Secondary | ICD-10-CM

## 2021-03-08 DIAGNOSIS — K219 Gastro-esophageal reflux disease without esophagitis: Secondary | ICD-10-CM | POA: Diagnosis not present

## 2021-03-08 DIAGNOSIS — I10 Essential (primary) hypertension: Secondary | ICD-10-CM

## 2021-03-08 DIAGNOSIS — F411 Generalized anxiety disorder: Secondary | ICD-10-CM

## 2021-03-08 LAB — CBC WITH DIFFERENTIAL/PLATELET
Basophils Absolute: 0 10*3/uL (ref 0.0–0.1)
Basophils Relative: 1 % (ref 0.0–3.0)
Eosinophils Absolute: 0.1 10*3/uL (ref 0.0–0.7)
Eosinophils Relative: 2 % (ref 0.0–5.0)
HCT: 38.1 % (ref 36.0–46.0)
Hemoglobin: 12.6 g/dL (ref 12.0–15.0)
Lymphocytes Relative: 16.3 % (ref 12.0–46.0)
Lymphs Abs: 0.7 10*3/uL (ref 0.7–4.0)
MCHC: 33 g/dL (ref 30.0–36.0)
MCV: 94.1 fl (ref 78.0–100.0)
Monocytes Absolute: 0.4 10*3/uL (ref 0.1–1.0)
Monocytes Relative: 9 % (ref 3.0–12.0)
Neutro Abs: 3.2 10*3/uL (ref 1.4–7.7)
Neutrophils Relative %: 71.7 % (ref 43.0–77.0)
Platelets: 257 10*3/uL (ref 150.0–400.0)
RBC: 4.05 Mil/uL (ref 3.87–5.11)
RDW: 13.3 % (ref 11.5–15.5)
WBC: 4.4 10*3/uL (ref 4.0–10.5)

## 2021-03-08 LAB — BASIC METABOLIC PANEL
BUN: 10 mg/dL (ref 6–23)
CO2: 28 mEq/L (ref 19–32)
Calcium: 9.9 mg/dL (ref 8.4–10.5)
Chloride: 101 mEq/L (ref 96–112)
Creatinine, Ser: 0.61 mg/dL (ref 0.40–1.20)
GFR: 101.71 mL/min (ref >60.00)
Glucose, Bld: 104 mg/dL — ABNORMAL HIGH (ref 70–99)
Potassium: 4.4 mEq/L (ref 3.5–5.1)
Sodium: 138 mEq/L (ref 135–145)

## 2021-03-08 LAB — VITAMIN B12: Vitamin B-12: 225 pg/mL (ref 211–911)

## 2021-03-08 MED ORDER — SERTRALINE HCL 50 MG PO TABS: 50.0000 mg | ORAL_TABLET | Freq: Every day | ORAL | 3 refills | Status: DC

## 2021-03-08 MED ORDER — MONTELUKAST SODIUM 10 MG PO TABS: ORAL_TABLET | ORAL | 3 refills | Status: DC

## 2021-03-08 NOTE — Addendum Note (Signed)
Addended by: Gean Birchwood on: 03/08/2021 09:14 AM   Modules accepted: Orders

## 2021-03-08 NOTE — Patient Instructions (Signed)
Please return in 6 months for your annual complete physical; please come fasting.   I will release your lab results to you on your MyChart account with further instructions. Please reply with any questions.    We will call you to get you set up for your barium swallow study to check your esophagus and stomach.   Today you were given your 2nd of 2 Shingrix and flu vaccinations.    If you have any questions or concerns, please don't hesitate to send me a message via MyChart or call the office at [phone]. Thank you for visiting with [place] today! It's our pleasure caring for you.

## 2021-03-08 NOTE — Progress Notes (Signed)
Subjective  CC:  Chief Complaint  Patient presents with   Hypertension   Gastroesophageal Reflux    HPI: Teresa Dunn is a 55 y.o. female who presents to the office today to address the problems listed above in the chief complaint. Hypertension f/u: Control is good . Pt reports she is doing well. taking medications as instructed, no medication side effects noted, no TIAs, no chest pain on exertion, no dyspnea on exertion, no swelling of ankles. On lisinopril 40daily. Mild hyperkalemia in march, needs recheck. She denies adverse effects from his BP medications. Compliance with medication is good.  GERD on long term high dose PPI, dosed by prior pcp. Reports rare dysphagia: food gets stuck in upper esophagus. No coughing. Rare breakthrough sxs. No n/v or hematemesis. No melena or anemia. Neg h. Pylori back in 2011. No h/o EGD or imaging study.  GAD: doing well.  Had right knee arthroscopy w/ meniscectomy in April - I reviewed f/u notes. Still with knee pain - works on concrete floors.  Due for flu and 2nd shingrix  Assessment  1. Essential hypertension   2. Generalized anxiety disorder   3. Gastroesophageal reflux disease, unspecified whether esophagitis present   4. Former heavy tobacco smoker   5. Current use of proton pump inhibitor   6. Status post arthroscopic surgery of right knee      Plan   Hypertension f/u: BP control is fairly well controlled. Check bmp; add hctz if potassium remains elevated.  GAD f/u: very well controlled on sertraline 50 daily. Relled.  GERD: fair control - check b12, h,pylori and UGI barium swallow given breakthrough and dysphagia sxs. Education given. At risk for esophageal cancer given h/o smoking Knee surgery: improved but pain due to working. Using otc meds Shingrix #2 and flu vaccines given today  Education regarding management of these chronic disease states was given. Management strategies discussed on successive visits include dietary  and exercise recommendations, goals of achieving and maintaining IBW, and lifestyle modifications aiming for adequate sleep and minimizing stressors.   Follow up: 6 mo for cpe and recheck  Orders Placed This Encounter  Procedures   DG UGI W SINGLE CM (SOL OR THIN BA)   Basic metabolic panel   Vitamin P80   H. pylori antibody, IgG   CBC with Differential/Platelet   No orders of the defined types were placed in this encounter.     BP Readings from Last 3 Encounters:  03/08/21 120/84  09/02/20 128/78  03/07/20 138/80   Wt Readings from Last 3 Encounters:  03/08/21 255 lb 12.8 oz (116 kg)  09/02/20 245 lb (111.1 kg)  03/07/20 251 lb 3.2 oz (113.9 kg)    Lab Results  Component Value Date   CHOL 202 (H) 09/02/2020   CHOL 193 09/02/2019   CHOL 178 03/27/2018   Lab Results  Component Value Date   HDL 97.60 09/02/2020   HDL 74.70 09/02/2019   HDL 79.90 03/27/2018   Lab Results  Component Value Date   LDLCALC 95 09/02/2020   LDLCALC 101 (H) 09/02/2019   LDLCALC 87 03/27/2018   Lab Results  Component Value Date   TRIG 49.0 09/02/2020   TRIG 85.0 09/02/2019   TRIG 57.0 03/27/2018   Lab Results  Component Value Date   CHOLHDL 2 09/02/2020   CHOLHDL 3 09/02/2019   CHOLHDL 2 03/27/2018   No results found for: LDLDIRECT Lab Results  Component Value Date   CREATININE 0.74 09/02/2020   BUN  17 09/02/2020   NA 134 (L) 09/02/2020   K 5.2 (H) 09/02/2020   CL 99 09/02/2020   CO2 26 09/02/2020    The 10-year ASCVD risk score (Arnett DK, et al., 2019) is: 1.1%   Values used to calculate the score:     Age: 60 years     Sex: Female     Is Non-Hispanic African American: No     Diabetic: No     Tobacco smoker: No     Systolic Blood Pressure: 196 mmHg     Is BP treated: Yes     HDL Cholesterol: 97.6 mg/dL     Total Cholesterol: 202 mg/dL  I reviewed the patients updated PMH, FH, and SocHx.    Patient Active Problem List   Diagnosis Date Noted   Malignant  neoplasm of upper-outer quadrant of right breast in female, estrogen receptor positive ([place]) 11/18/2019    Priority: High   Obesity (BMI 30-39.9) 03/27/2018    Priority: High   Essential hypertension 02/25/2018    Priority: High   Former heavy tobacco smoker 02/25/2018    Priority: High   Generalized anxiety disorder 10/10/2010    Priority: High   Moderate persistent asthma 02/25/2018    Priority: Medium   Chronic mixed headache syndrome 08/07/2012    Priority: Medium   GERD (gastroesophageal reflux disease) 06/26/2012    Priority: Medium   Major depression, recurrent, chronic ([place]) 10/23/2010    Priority: Medium   Primary osteoarthritis of right knee 09/02/2019    Priority: Low   Lipoma of abdominal wall 02/25/2018    Priority: Low   Plantar fasciitis of left foot 11/30/2016    Priority: Low   Perennial allergic rhinitis 08/07/2012    Priority: Low   History of total knee replacement, left 06/22/2020   Family history of breast cancer     Allergies: Codeine and Asa [aspirin]  Social History: Patient  reports that she quit smoking about 6 years ago. Her smoking use included cigarettes. She has a 45.00 pack-year smoking history. She has never used smokeless tobacco. She reports current alcohol use. She reports that she does not use drugs.  Current Meds  Medication Sig   albuterol (VENTOLIN HFA) 108 (90 Base) MCG/ACT inhaler Inhale 1-2 puffs into the lungs every 4 (four) hours as needed for wheezing or shortness of breath.   ALPRAZolam (XANAX) 0.5 MG tablet Take 1 tablet (0.5 mg total) by mouth daily as needed for anxiety.   clobetasol cream (TEMOVATE) 2.22 % Apply 1 application topically every morning.   Fluticasone-Salmeterol (WIXELA INHUB) 250-50 MCG/DOSE AEPB Inhale 1 puff into the lungs in the morning and at bedtime.   lisinopril (ZESTRIL) 40 MG tablet TAKE 1 TABLET(40 MG) BY MOUTH AT BEDTIME   loratadine (CLARITIN) 10 MG tablet Take 10 mg by mouth daily as needed for  allergies.    montelukast (SINGULAIR) 10 MG tablet TAKE 1 TABLET(10 MG) BY MOUTH AT BEDTIME   omeprazole (PRILOSEC) 40 MG capsule Take 1 capsule (40 mg total) by mouth daily.   sertraline (ZOLOFT) 50 MG tablet Take 1 tablet (50 mg total) by mouth at bedtime.   triamcinolone (NASACORT ALLERGY 24HR) 55 MCG/ACT AERO nasal inhaler Place 1 spray into the nose daily as needed (congestion/allergies.).    Review of Systems: Cardiovascular: negative for chest pain, palpitations, leg swelling, orthopnea Respiratory: negative for SOB, wheezing or persistent cough Gastrointestinal: negative for abdominal pain Genitourinary: negative for dysuria or gross hematuria  Objective  Vitals:  BP 120/84   Pulse 67   Temp 97.9 F (36.6 C) (Temporal)   Ht 5\' 7"  (1.702 m)   Wt 255 lb 12.8 oz (116 kg)   LMP 06/27/2019 (Approximate)   SpO2 96%   BMI 40.06 kg/m  General: no acute distress  Psych:  Alert and oriented, normal mood and affect HEENT:  Normocephalic, atraumatic, supple neck  Cardiovascular:  RRR without murmur. no edema Respiratory:  Good breath sounds bilaterally, CTAB with normal respiratory effort Skin:  Warm, no rashes Neurologic:   Mental status is normal Commons side effects, risks, benefits, and alternatives for medications and treatment plan prescribed today were discussed, and the patient expressed understanding of the given instructions. Patient is instructed to call or message via MyChart if he/she has any questions or concerns regarding our treatment plan. No barriers to understanding were identified. We discussed Red Flag symptoms and signs in detail. Patient expressed understanding regarding what to do in case of urgent or emergency type symptoms.  Medication list was reconciled, printed and provided to the patient in AVS. Patient instructions and summary information was reviewed with the patient as documented in the AVS. This note was prepared with assistance of Dragon voice  recognition software. Occasional wrong-word or sound-a-like substitutions may have occurred due to the inherent limitations of voice recognition software  This visit occurred during the SARS-CoV-2 public health emergency.  Safety protocols were in place, including screening questions prior to the visit, additional usage of staff PPE, and extensive cleaning of exam room while observing appropriate contact time as indicated for disinfecting solutions.

## 2021-03-09 ENCOUNTER — Encounter: Payer: Self-pay | Admitting: Family Medicine

## 2021-03-09 DIAGNOSIS — E538 Deficiency of other specified B group vitamins: Secondary | ICD-10-CM | POA: Insufficient documentation

## 2021-03-09 LAB — H. PYLORI ANTIBODY, IGG: H Pylori IgG: NEGATIVE

## 2021-05-15 ENCOUNTER — Encounter: Payer: Self-pay | Admitting: Family Medicine

## 2021-05-15 ENCOUNTER — Other Ambulatory Visit: Payer: Self-pay

## 2021-05-15 ENCOUNTER — Ambulatory Visit: Payer: BC Managed Care – PPO | Admitting: Family Medicine

## 2021-05-15 VITALS — BP 132/80 | HR 72 | Temp 98.0°F | Ht 67.0 in | Wt 247.4 lb

## 2021-05-15 DIAGNOSIS — J45909 Unspecified asthma, uncomplicated: Secondary | ICD-10-CM | POA: Diagnosis not present

## 2021-05-15 DIAGNOSIS — J209 Acute bronchitis, unspecified: Secondary | ICD-10-CM | POA: Diagnosis not present

## 2021-05-15 DIAGNOSIS — U071 COVID-19: Secondary | ICD-10-CM

## 2021-05-15 DIAGNOSIS — Z87891 Personal history of nicotine dependence: Secondary | ICD-10-CM | POA: Diagnosis not present

## 2021-05-15 DIAGNOSIS — J4540 Moderate persistent asthma, uncomplicated: Secondary | ICD-10-CM | POA: Diagnosis not present

## 2021-05-15 LAB — POC COVID19 BINAXNOW: SARS Coronavirus 2 Ag: POSITIVE — AB

## 2021-05-15 LAB — POCT INFLUENZA A/B
Influenza A, POC: NEGATIVE
Influenza B, POC: NEGATIVE

## 2021-05-15 MED ORDER — NIRMATRELVIR/RITONAVIR (PAXLOVID)TABLET: 3.0000 | ORAL_TABLET | Freq: Two times a day (BID) | ORAL | 0 refills | Status: AC

## 2021-05-15 MED ORDER — PREDNISONE 10 MG PO TABS: ORAL_TABLET | ORAL | 0 refills | Status: DC

## 2021-05-15 NOTE — Progress Notes (Signed)
Subjective  CC:  Chief Complaint  Patient presents with   Shortness of Breath    Has been using rescue inhaler, wheezing   Cough    Yellow mucus   Nasal Congestion    Ongoing since 05/12/2021, headache    HPI: SUBJECTIVE:  Teresa Dunn is a 55 y.o. female who complains of congestion, nasal blockage, post nasal drip, cough described as harsh and denies sinus, high fevers, SOB, chest pain or significant GI symptoms. She has tighntess and wheezing due to her asthma and is using albuterol 3x/day to help. No dyspnea now. Symptoms have been present for 3-4 days. She is vaccinated for flu and covid. She denies a history of anorexia, dizziness, vomiting and wheezing. She was a former smoker.   Assessment  1. Bronchitis with asthma, acute   2. Former heavy tobacco smoker   3. Moderate persistent asthma without complication   4. COVID-19      Plan  Discussion:  Faint postive covid test: education given. Paxlovid due to asthma. Pred for bronchospasm. Isolation  5 days. Cold/cough meds.   Follow up: prn   Orders Placed This Encounter  Procedures   POC COVID-19   POCT Influenza A/B    Meds ordered this encounter  Medications   predniSONE (DELTASONE) 10 MG tablet    Sig: Take 4 tabs qd x 2 days, 3 qd x 2 days, 2 qd x 2d, 1qd x 3 days    Dispense:  21 tablet    Refill:  0   nirmatrelvir/ritonavir EUA (PAXLOVID) 20 x 150 MG & 10 x 100MG  TABS    Sig: Take 3 tablets by mouth 2 (two) times daily for 5 days. (Take nirmatrelvir 150 mg two tablets twice daily for 5 days and ritonavir 100 mg one tablet twice daily for 5 days) Patient GFR is 101 02/2021    Dispense:  30 tablet    Refill:  0       I reviewed the patients updated PMH, FH, and SocHx.  Social History: Patient  reports that she quit smoking about 7 years ago. Her smoking use included cigarettes. She has a 45.00 pack-year smoking history. She has never used smokeless tobacco. She reports current alcohol use. She reports  that she does not use drugs.  Patient Active Problem List   Diagnosis Date Noted   Malignant neoplasm of upper-outer quadrant of right breast in female, estrogen receptor positive ([place]) 11/18/2019    Priority: High   Obesity (BMI 30-39.9) 03/27/2018    Priority: High   Essential hypertension 02/25/2018    Priority: High   Former heavy tobacco smoker 02/25/2018    Priority: High   Generalized anxiety disorder 10/10/2010    Priority: High   Moderate persistent asthma 02/25/2018    Priority: Medium    Chronic mixed headache syndrome 08/07/2012    Priority: Medium    GERD (gastroesophageal reflux disease) 06/26/2012    Priority: Medium    Major depression, recurrent, chronic ([place]) 10/23/2010    Priority: Medium    Primary osteoarthritis of right knee 09/02/2019    Priority: Low   Lipoma of abdominal wall 02/25/2018    Priority: Low   Plantar fasciitis of left foot 11/30/2016    Priority: Low   Perennial allergic rhinitis 08/07/2012    Priority: Low   Vitamin B12 deficiency 03/09/2021   History of total knee replacement, left 06/22/2020   Family history of breast cancer     Review of Systems: Cardiovascular:  negative for chest pain Respiratory: negative for SOB or hemoptysis Gastrointestinal: negative for abdominal pain Genitourinary: negative for dysuria or gross hematuria Current Meds  Medication Sig   ADVAIR DISKUS 250-50 MCG/ACT AEPB INHALE 1 PUFF INTO THE LUNGS IN THE MORNING AND AT BEDTIME   albuterol (VENTOLIN HFA) 108 (90 Base) MCG/ACT inhaler Inhale 1-2 puffs into the lungs every 4 (four) hours as needed for wheezing or shortness of breath.   ALPRAZolam (XANAX) 0.5 MG tablet Take 1 tablet (0.5 mg total) by mouth daily as needed for anxiety.   clobetasol cream (TEMOVATE) 7.09 % Apply 1 application topically every morning.   lisinopril (ZESTRIL) 40 MG tablet TAKE 1 TABLET(40 MG) BY MOUTH AT BEDTIME   loratadine (CLARITIN) 10 MG tablet Take 10 mg by mouth daily as  needed for allergies.    montelukast (SINGULAIR) 10 MG tablet TAKE 1 TABLET(10 MG) BY MOUTH AT BEDTIME   nirmatrelvir/ritonavir EUA (PAXLOVID) 20 x 150 MG & 10 x 100MG  TABS Take 3 tablets by mouth 2 (two) times daily for 5 days. (Take nirmatrelvir 150 mg two tablets twice daily for 5 days and ritonavir 100 mg one tablet twice daily for 5 days) Patient GFR is 101 02/2021   omeprazole (PRILOSEC) 40 MG capsule Take 1 capsule (40 mg total) by mouth daily.   predniSONE (DELTASONE) 10 MG tablet Take 4 tabs qd x 2 days, 3 qd x 2 days, 2 qd x 2d, 1qd x 3 days   sertraline (ZOLOFT) 50 MG tablet Take 1 tablet (50 mg total) by mouth at bedtime.   triamcinolone (NASACORT ALLERGY 24HR) 55 MCG/ACT AERO nasal inhaler Place 1 spray into the nose daily as needed (congestion/allergies.).    Objective  Vitals: BP 132/80   Pulse 72   Temp 98 F (36.7 C) (Temporal)   Ht 5\' 7"  (1.702 m)   Wt 247 lb 6.4 oz (112.2 kg)   LMP 06/27/2019 (Approximate)   SpO2 97%   BMI 38.75 kg/m  General: no acute distress  Psych:  Alert and oriented, normal mood and affect HEENT:  Normocephalic, atraumatic, supple neck, moist mucous membranes, mildly erythematous pharynx without exudate, mild lymphadenopathy, supple neck Cardiovascular:  RRR without murmur. no edema Respiratory:  Good breath sounds bilaterally, CTAB with normal respiratory effort with occasional rhonchi, coughing  Office Visit on 05/15/2021  Component Date Value Ref Range Status   SARS Coronavirus 2 Ag 05/15/2021 Positive (A)  Negative Final   Influenza A, POC 05/15/2021 Negative  Negative Final   Influenza B, POC 05/15/2021 Negative  Negative Final     Commons side effects, risks, benefits, and alternatives for medications and treatment plan prescribed today were discussed, and the patient expressed understanding of the given instructions. Patient is instructed to call or message via MyChart if he/she has any questions or concerns regarding our treatment  plan. No barriers to understanding were identified. We discussed Red Flag symptoms and signs in detail. Patient expressed understanding regarding what to do in case of urgent or emergency type symptoms.  Medication list was reconciled, printed and provided to the patient in AVS. Patient instructions and summary information was reviewed with the patient as documented in the AVS. This note was prepared with assistance of Dragon voice recognition software. Occasional wrong-word or sound-a-like substitutions may have occurred due to the inherent limitations of voice recognition software

## 2021-05-15 NOTE — Patient Instructions (Signed)
Please follow up if symptoms do not improve or as needed.   

## 2021-05-22 ENCOUNTER — Other Ambulatory Visit: Payer: Self-pay | Admitting: Family Medicine

## 2021-08-25 ENCOUNTER — Telehealth: Payer: Self-pay | Admitting: Family Medicine

## 2021-08-25 NOTE — Telephone Encounter (Signed)
I spoke the pt to make her aware that the letter has been updated. It was sent to both My Chart, and I have printed and will take it to the front desk for pick up. She verbalizes understanding.  ?

## 2021-08-25 NOTE — Telephone Encounter (Signed)
Patient states she was in to see dr Jonni Sanger on 05/15/22- she tested + for covid on that day - she obtained a letter (on file) stating she is to be out of work from 11/25 to 12/2- however the letter should have stated to be out of work from 11/28 to 12/2. Can Dr Jonni Sanger please correct letter? [employer] will not reimburse her with current letter as date is wrong.  ?

## 2021-09-07 ENCOUNTER — Ambulatory Visit (INDEPENDENT_AMBULATORY_CARE_PROVIDER_SITE_OTHER): Payer: BC Managed Care – PPO | Admitting: Family Medicine

## 2021-09-07 ENCOUNTER — Encounter: Payer: Self-pay | Admitting: Family Medicine

## 2021-09-07 VITALS — BP 109/72 | HR 75 | Temp 97.7°F | Ht 67.0 in | Wt 246.6 lb

## 2021-09-07 DIAGNOSIS — Z170 Estrogen receptor positive status [ER+]: Secondary | ICD-10-CM

## 2021-09-07 DIAGNOSIS — F339 Major depressive disorder, recurrent, unspecified: Secondary | ICD-10-CM

## 2021-09-07 DIAGNOSIS — Z0000 Encounter for general adult medical examination without abnormal findings: Secondary | ICD-10-CM

## 2021-09-07 DIAGNOSIS — I10 Essential (primary) hypertension: Secondary | ICD-10-CM | POA: Diagnosis not present

## 2021-09-07 DIAGNOSIS — E538 Deficiency of other specified B group vitamins: Secondary | ICD-10-CM | POA: Diagnosis not present

## 2021-09-07 DIAGNOSIS — J4540 Moderate persistent asthma, uncomplicated: Secondary | ICD-10-CM

## 2021-09-07 DIAGNOSIS — C50411 Malignant neoplasm of upper-outer quadrant of right female breast: Secondary | ICD-10-CM

## 2021-09-07 DIAGNOSIS — F411 Generalized anxiety disorder: Secondary | ICD-10-CM

## 2021-09-07 LAB — TSH: TSH: 0.88 u[IU]/mL (ref 0.35–5.50)

## 2021-09-07 LAB — CBC WITH DIFFERENTIAL/PLATELET
Basophils Absolute: 0 10*3/uL (ref 0.0–0.1)
Basophils Relative: 0.7 % (ref 0.0–3.0)
Eosinophils Absolute: 0.1 10*3/uL (ref 0.0–0.7)
Eosinophils Relative: 1.5 % (ref 0.0–5.0)
HCT: 40.5 % (ref 36.0–46.0)
Hemoglobin: 13.3 g/dL (ref 12.0–15.0)
Lymphocytes Relative: 18 % (ref 12.0–46.0)
Lymphs Abs: 0.7 10*3/uL (ref 0.7–4.0)
MCHC: 33 g/dL (ref 30.0–36.0)
MCV: 92.6 fl (ref 78.0–100.0)
Monocytes Absolute: 0.4 10*3/uL (ref 0.1–1.0)
Monocytes Relative: 9.4 % (ref 3.0–12.0)
Neutro Abs: 2.9 10*3/uL (ref 1.4–7.7)
Neutrophils Relative %: 70.4 % (ref 43.0–77.0)
Platelets: 252 10*3/uL (ref 150.0–400.0)
RBC: 4.37 Mil/uL (ref 3.87–5.11)
RDW: 13.3 % (ref 11.5–15.5)
WBC: 4.1 10*3/uL (ref 4.0–10.5)

## 2021-09-07 LAB — COMPREHENSIVE METABOLIC PANEL
ALT: 24 U/L (ref 0–35)
AST: 23 U/L (ref 0–37)
Albumin: 4.6 g/dL (ref 3.5–5.2)
Alkaline Phosphatase: 90 U/L (ref 39–117)
BUN: 18 mg/dL (ref 6–23)
CO2: 29 mEq/L (ref 19–32)
Calcium: 9.9 mg/dL (ref 8.4–10.5)
Chloride: 100 mEq/L (ref 96–112)
Creatinine, Ser: 0.71 mg/dL (ref 0.40–1.20)
GFR: 96.39 mL/min (ref >60.00)
Glucose, Bld: 115 mg/dL — ABNORMAL HIGH (ref 70–99)
Potassium: 4.8 mEq/L (ref 3.5–5.1)
Sodium: 135 mEq/L (ref 135–145)
Total Bilirubin: 0.9 mg/dL (ref 0.2–1.2)
Total Protein: 7.4 g/dL (ref 6.0–8.3)

## 2021-09-07 LAB — LIPID PANEL
Cholesterol: 206 mg/dL — ABNORMAL HIGH (ref 0–200)
HDL: 101.4 mg/dL (ref >39.00)
LDL Cholesterol: 96 mg/dL (ref 0–99)
NonHDL: 104.15
Total CHOL/HDL Ratio: 2
Triglycerides: 39 mg/dL (ref 0.0–149.0)
VLDL: 7.8 mg/dL (ref 0.0–40.0)

## 2021-09-07 LAB — VITAMIN B12: Vitamin B-12: 243 pg/mL (ref 211–911)

## 2021-09-07 NOTE — Progress Notes (Signed)
?Subjective  ?Chief Complaint  ?Patient presents with  ? Annual Exam  ? Hypertension  ? Gastroesophageal Reflux  ? Asthma  ? Vitamin B12 Deficiency  ? ? ?HPI: Teresa Dunn is a 56 y.o. female who presents to [facility] today for a Female Wellness Visit. She also has the concerns and/or needs as listed above in the chief complaint. These will be addressed in addition to the Health Maintenance Visit.  ? ?Wellness Visit: annual visit with health maintenance review and exam without Pap ? ?Health maintenance: History of breast cancer and right status post treatment.  Doing well.  Mammogram due in June.  Has follow-up with oncology.  Colon cancer screening due in November with Cologuard.  Immunizations are all current.  She is doing well.  Continues to work full-time.  Main concern is right knee osteoarthritis, managed by orthopedics.  Recent steroid injection was not very helpful.  Likely will need knee replacement in the future. ?Chronic disease f/u and/or acute problem visit: (deemed necessary to be done in addition to the wellness visit): ?Hypertension on lisinopril and well-controlled.  Low-salt diet.  Compliant with medications.  Denies chest pain, shortness of breath or lower extremity edema. ?Anxiety disorder and depression are well controlled on sertraline 50 mg daily.  No adverse effects ?Asthma: Well-controlled on maintenance inhaler, rare albuterol use.  Last episode of bronchitis with asthma exacerbation completely resolved. ?Vitamin B12 deficiency: Intermittently takes B12.  Does feel that energy is better. ? ? ?Assessment  ?1. Annual physical exam   ?2. Essential hypertension   ?3. Generalized anxiety disorder   ?4. Major depression, recurrent, chronic ([place])   ?5. Moderate persistent asthma without complication   ?6. Vitamin B12 deficiency   ?7. Malignant neoplasm of upper-outer quadrant of right breast in female, estrogen receptor positive ([place]) Chronic  ? ?  ?Plan   ?Female Wellness Visit: ?Age appropriate Health Maintenance and Prevention measures were discussed with patient. Included topics are cancer screening recommendations, ways to keep healthy (see AVS) including dietary and exercise recommendations, regular eye and dental care, use of seat belts, and avoidance of moderate alcohol use and tobacco use.  Discussed screenings: Mammogram due in June, colorectal cancer screening due in November. ?BMI: discussed patient's BMI and encouraged positive lifestyle modifications to help get to or maintain a target BMI. ?HM needs and immunizations were addressed and ordered. See below for orders. See HM and immunization section for updates. ?Routine labs and screening tests ordered including cmp, cbc and lipids where appropriate. ?Discussed recommendations regarding Vit D and calcium supplementation (see AVS) ? ?Chronic disease management visit and/or acute problem visit: ?Hypertension is well controlled on lisinopril 10 mg daily.  Continuing check renal function electrolytes.  Check fasting lipids. ?Asthma moderate persistent is well controlled by clinical history.  Clear exam today.  Continue Advair 250/50 twice daily ?Depression and anxiety: Remains well controlled on sertraline 50 mg daily. ?Recheck B12 levels, if low will encourage her to take daily. ?Breast cancer: Completed treatment.  Now on annual surveillance. ?Chronic GERD on PPI stable. ?Knee osteoarthritis per Ortho.  Very limiting ?Discussed weight management ? ?Follow up: 6 months for recheck ?Orders Placed This Encounter  ?Procedures  ? CBC with Differential/Platelet  ? Comprehensive metabolic panel  ? Lipid panel  ? TSH  ? Vitamin B12  ? ?No orders of the defined types were placed in this encounter. ? ?  ? ?Body mass index is 38.62 kg/m?. ?Wt Readings from Last  3 Encounters:  ?09/07/21 246 lb 9.6 oz (111.9 kg)  ?05/15/21 247 lb 6.4 oz (112.2 kg)  ?03/08/21 255 lb 12.8 oz (116 kg)  ? ? ? ?Patient Active Problem List   ? Diagnosis Date Noted  ? Malignant neoplasm of upper-outer quadrant of right breast in female, estrogen receptor positive ([place]) 11/18/2019  ?  Priority: High  ? Obesity (BMI 30-39.9) 03/27/2018  ?  Priority: High  ? Essential hypertension 02/25/2018  ?  Priority: High  ? Former heavy tobacco smoker 02/25/2018  ?  Priority: High  ?  1.5 ppd x 30 years. Quit 2016 ?  ? Generalized anxiety disorder 10/10/2010  ?  Priority: High  ?  cymbalta - weight gain and hot flushes ?wellbutrin failed, started zoloft January 2020.  ?  ? Moderate persistent asthma 02/25/2018  ?  Priority: Medium   ? Chronic mixed headache syndrome 08/07/2012  ?  Priority: Medium   ? GERD (gastroesophageal reflux disease) 06/26/2012  ?  Priority: Medium   ? Major depression, recurrent, chronic ([place]) 10/23/2010  ?  Priority: Medium   ? Primary osteoarthritis of right knee 09/02/2019  ?  Priority: Low  ? Lipoma of abdominal wall 02/25/2018  ?  Priority: Low  ? Plantar fasciitis of left foot 11/30/2016  ?  Priority: Low  ? Perennial allergic rhinitis 08/07/2012  ?  Priority: Low  ? Vitamin B12 deficiency 03/09/2021  ?  Chronic ppi;rec oral b12 02/2021 ?  ? History of total knee replacement, left 06/22/2020  ? Family history of breast cancer   ? ?Health Maintenance  ?Topic Date Due  ? TETANUS/TDAP  09/08/2022 (Originally 06/26/2021)  ? MAMMOGRAM  11/21/2021  ? Fecal DNA (Cologuard)  04/29/2022  ? PAP SMEAR-Modifier  03/28/2023  ? INFLUENZA VACCINE  Completed  ? Hepatitis C Screening  Completed  ? HIV Screening  Completed  ? Zoster Vaccines- Shingrix  Completed  ? HPV VACCINES  Aged Out  ? COVID-19 Vaccine  Discontinued  ? ?Immunization History  ?Administered Date(s) Administered  ? Influenza Split 03/26/2012  ? Influenza,inj,Quad PF,6+ Mos 02/25/2018, 02/11/2019, 03/07/2020, 03/08/2021  ? Influenza,trivalent, recombinat, inj, PF 03/29/2011  ? PFIZER Comirnaty(Gray Top)Covid-19 Tri-Sucrose Vaccine 09/03/2020  ? PFIZER(Purple Top)SARS-COV-2 Vaccination  08/22/2019, 09/12/2019  ? Pneumococcal Conjugate-13 06/27/2007  ? Tdap 06/27/2011  ? Zoster Recombinat (Shingrix) 09/02/2020, 03/08/2021  ? ?We updated and reviewed the patient's past history in detail and it is documented below. ?Allergies: ?Patient is allergic to codeine and asa [aspirin]. 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 ?Past Surgical History ?Patient  has a past surgical history that includes Tubal ligation; Shoulder arthroscopy with subacromial decompression (Left, 11/18/2014); Knee surgery; Cervix lesion destruction (1990); Breast lumpectomy with radioactive seed and sentinel lymph node biopsy (Bilateral, 12/03/2019); Joint replacement; Breast biopsy (Right, 11/09/2019); Breast biopsy (Left, 11/11/2019); Breast lumpectomy (Right, 12/03/2019); Breast excisional biopsy (Left, 12/03/2019); and Knee arthroscopy w/ meniscectomy (Right, 09/2020). ?Family History: ?Patient family history includes ADD / ADHD in her son; Alcohol abuse in her mother; Asthma in her father; Bipolar disorder in her mother; Breast cancer in her maternal grandmother and  paternal grandmother; Cancer in her maternal aunt; Cervical cancer in her sister; Diabetes in her father and mother; Healthy in her brother and son; Heart disease in her father and mother; Hypertension in her father and mother; Lung cancer in her maternal grandfather and mother; Pneumonia in her paternal uncle; Skin cancer in her sister;  Stroke in her maternal grandmother. ?Social History:  ?Patient  reports that she quit smoking about 7 years ago. Her smoking use included cigarettes. She has a 45.00 pack-year smoking history. She has never used s

## 2021-09-07 NOTE — Patient Instructions (Signed)
Please return in 6 months for recheck. ? ?I will release your lab results to you on your MyChart account with further instructions. You may see the results before I do, but when I review them I will send you a message with my report or have my assistant call you if things need to be discussed. Please reply to my message with any questions. Thank you!  ? ?Glad you are doing so well! ? ?If you have any questions or concerns, please don't hesitate to send me a message via MyChart or call the office at [phone]. Thank you for visiting with [place] today! It's our pleasure caring for you.  ?

## 2021-10-10 ENCOUNTER — Other Ambulatory Visit: Payer: Self-pay | Admitting: Family Medicine

## 2021-10-10 DIAGNOSIS — Z9889 Other specified postprocedural states: Secondary | ICD-10-CM

## 2021-11-22 ENCOUNTER — Ambulatory Visit
Admission: RE | Admit: 2021-11-22 | Discharge: 2021-11-22 | Disposition: A | Discharge status: Home / Self Care | Payer: BC Managed Care – PPO | Source: Ambulatory Visit | Attending: Family Medicine | Admitting: Family Medicine

## 2021-11-22 DIAGNOSIS — Z9889 Other specified postprocedural states: Secondary | ICD-10-CM

## 2021-12-25 IMAGING — MG DIGITAL DIAGNOSTIC BILAT W/ TOMO W/ CAD
8 of 11 series · 8 of 23 positions shown · non-contrast
Comparison: Previous exam(s).

CLINICAL DATA: The patient was called back for right breast
distortion and left breast calcifications.

EXAM:
DIGITAL DIAGNOSTIC BILATERAL MAMMOGRAM WITH TOMO
ULTRASOUND RIGHT BREAST

[L ML (1 of 4)]
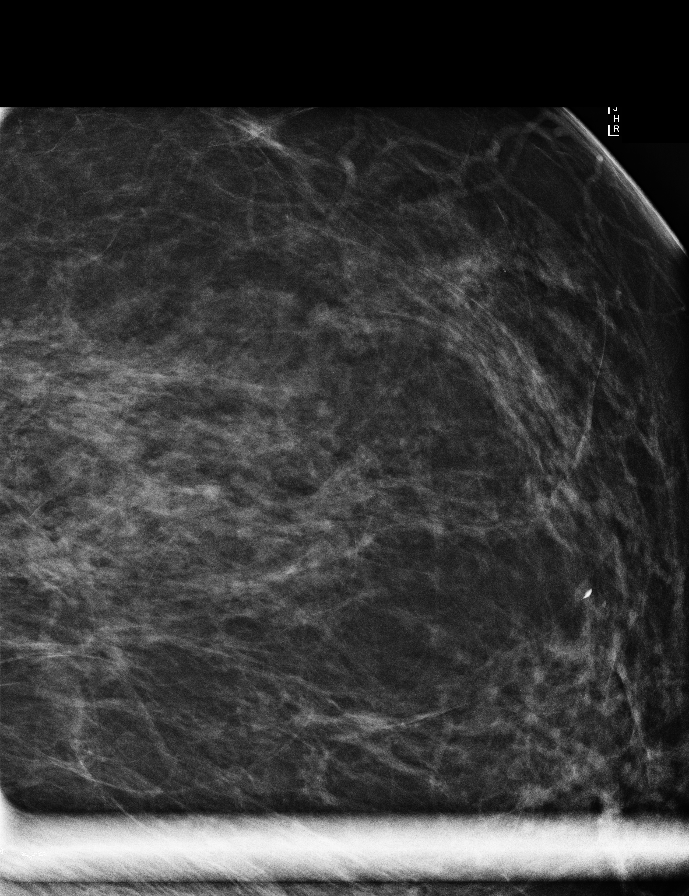

[L ML (2 of 4)]
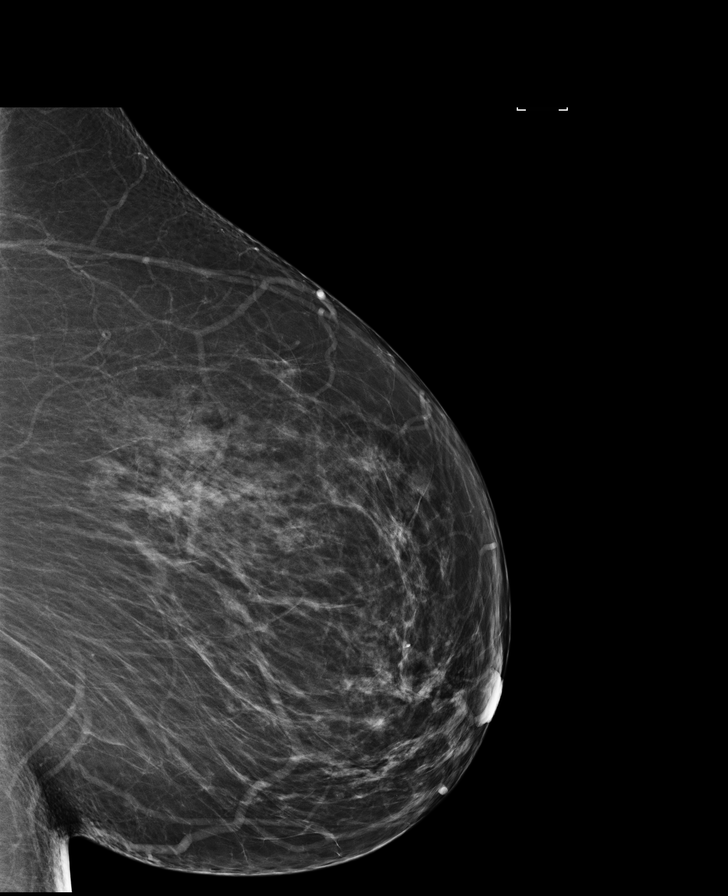

[L ML (3 of 4)]
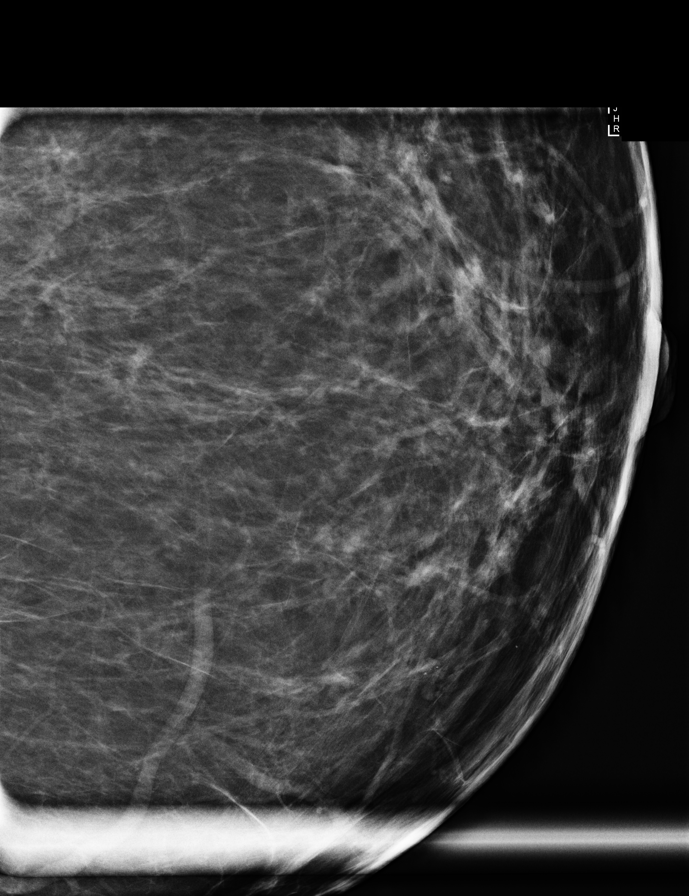

[L ML (4 of 4)]
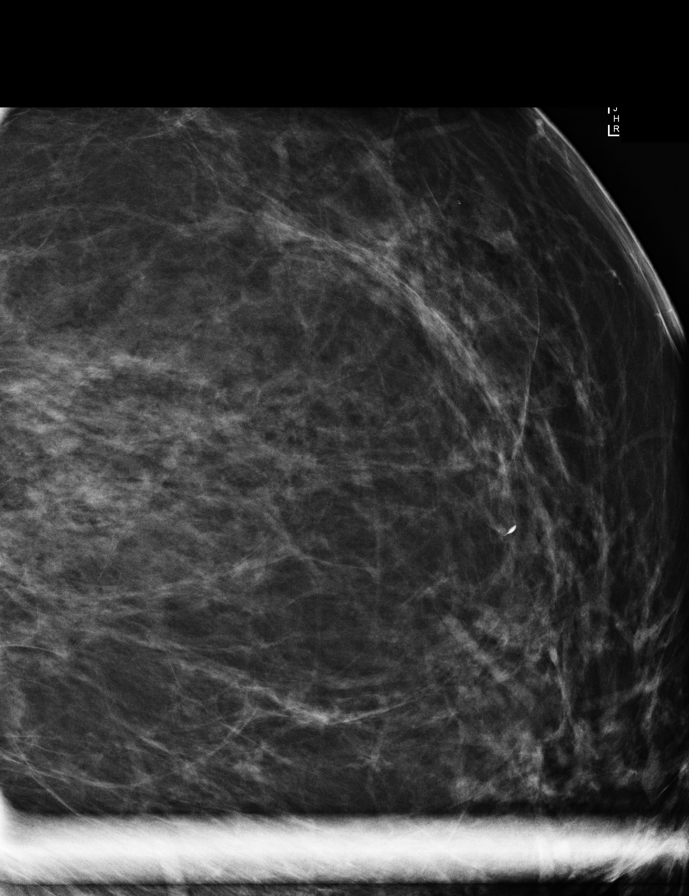

[L CC]
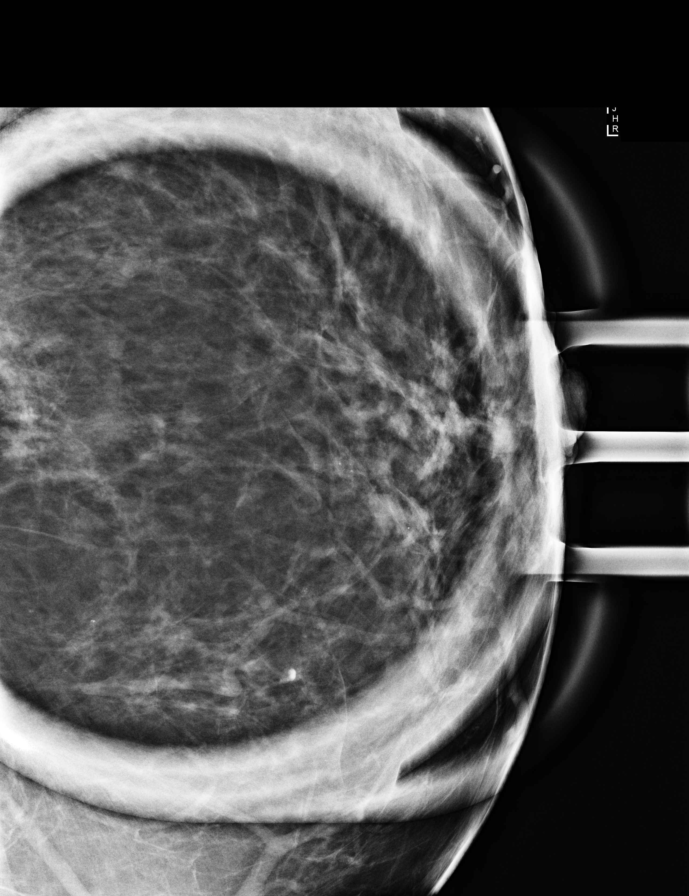

[R ML synth-2D]
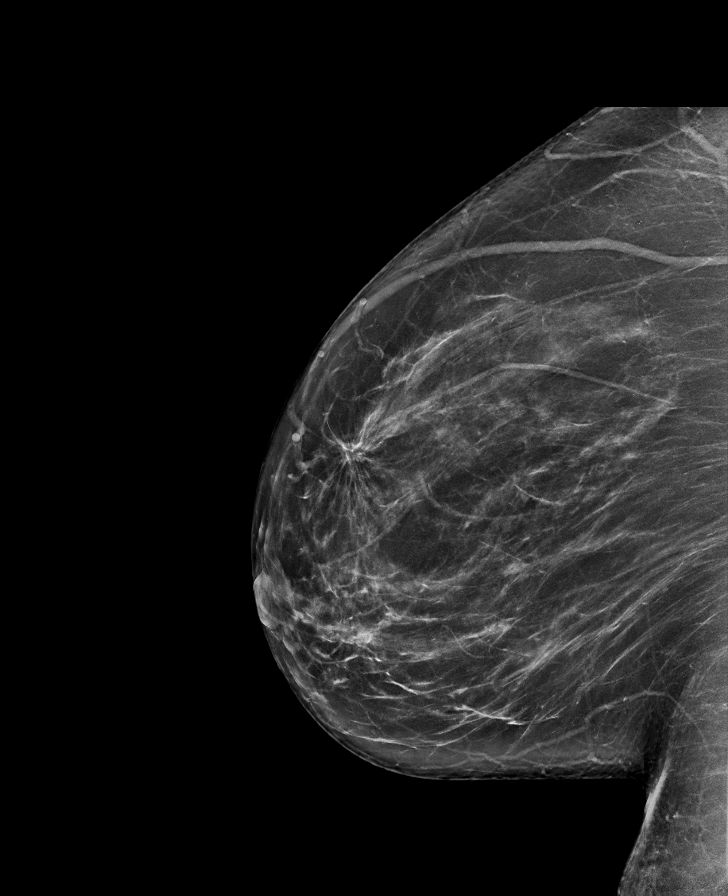

[R MLO synth-2D]
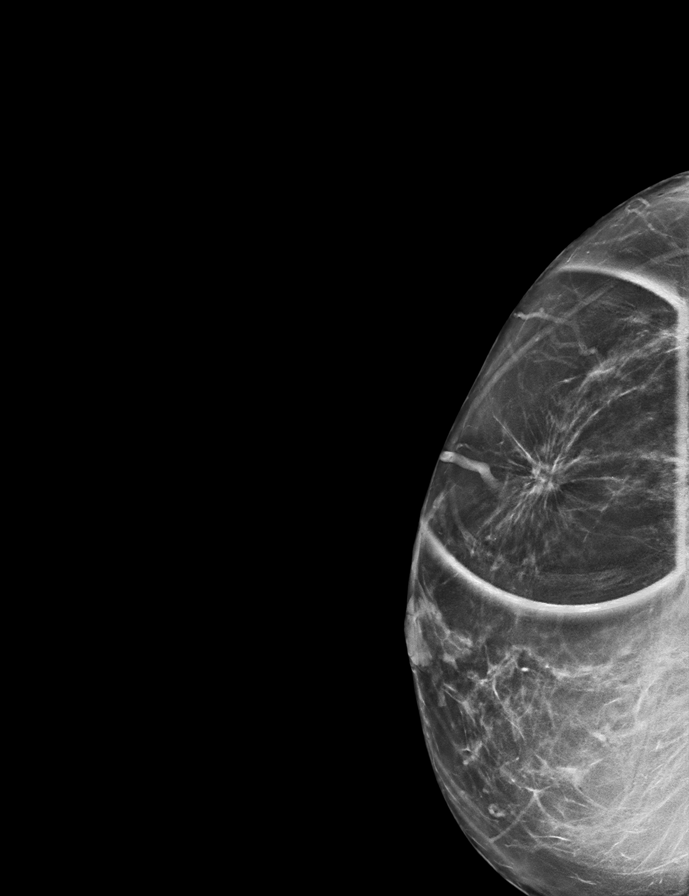

[R CC synth-2D]
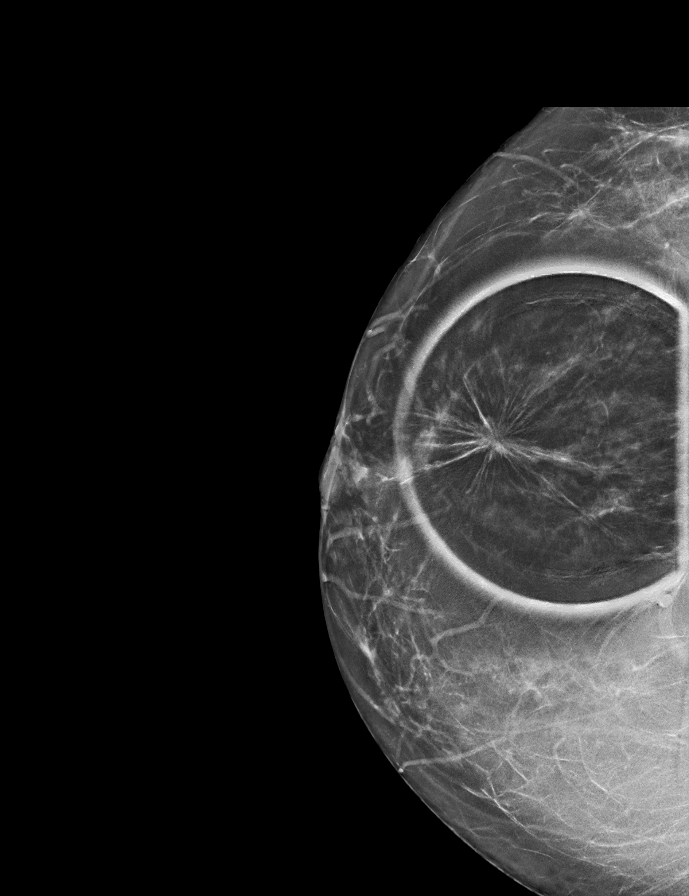

[8 of 23 positions shown; findings below may reference images not displayed]

ACR Breast Density Category b: There are scattered areas of
fibroglandular density.
FINDINGS: There is a mass with associated distortion in the upper outer right
breast. There is a group of calcifications in the inferior central
left breast which are new compared to 5144.

On physical exam, no suspicious lumps are identified.

Targeted ultrasound is performed, showing a mass with associated
distortion in the right breast at [DATE], 2 cm from the nipple
measuring 8 x 6 by 9 mm. No axillary adenopathy.
IMPRESSION: Highly suspicious mass in the right breast with associated
distortion. Indeterminate calcifications in the inferior central
left breast, new since 5144.

RECOMMENDATION:
Recommend ultrasound-guided biopsy of the right breast mass.
Recommend stereotactic biopsy of the new left breast calcifications.

I have discussed the findings and recommendations with the patient.
If applicable, a reminder letter will be sent to the patient
regarding the next appointment.

BI-RADS CATEGORY  5: Highly suggestive of malignancy.

## 2021-12-25 IMAGING — US US BREAST*R* LIMITED INC AXILLA
1 series · 12 of 12 positions shown · non-contrast
Comparison: Previous exam(s).

CLINICAL DATA: The patient was called back for right breast
distortion and left breast calcifications.

EXAM:
DIGITAL DIAGNOSTIC BILATERAL MAMMOGRAM WITH TOMO
ULTRASOUND RIGHT BREAST

[Series 1: us breast*right* limited inc axilla · 0.06mm/px · 12 of 12 slices shown]
[im 1/12]
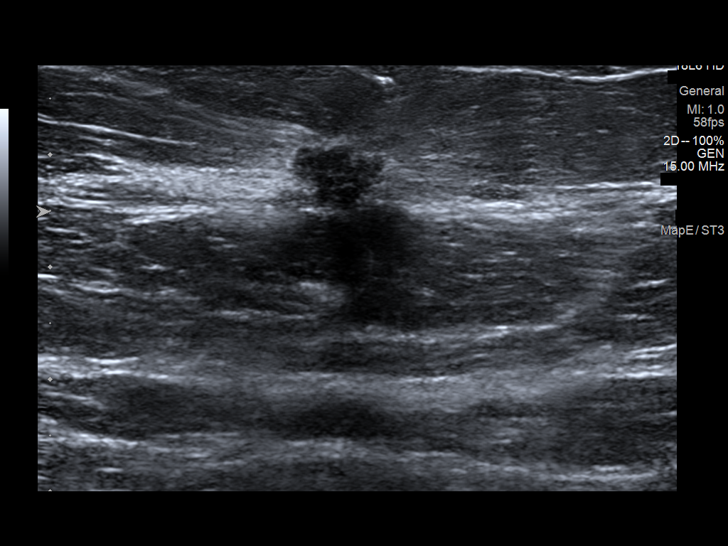
[im 2/12]
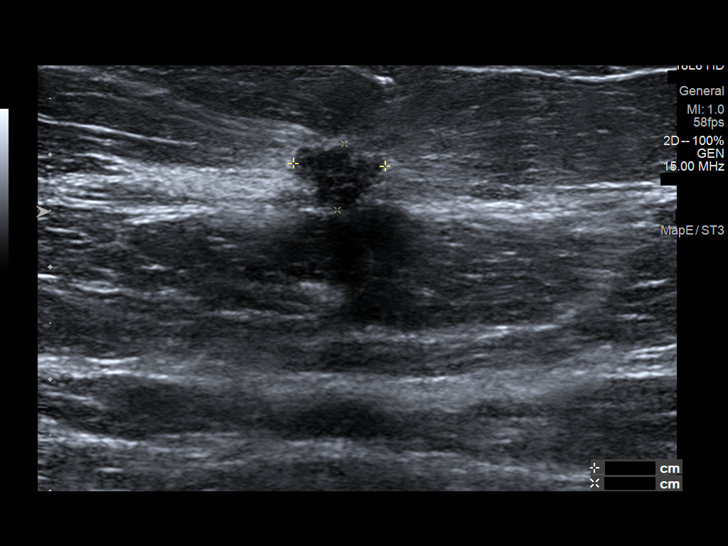
[im 3/12]
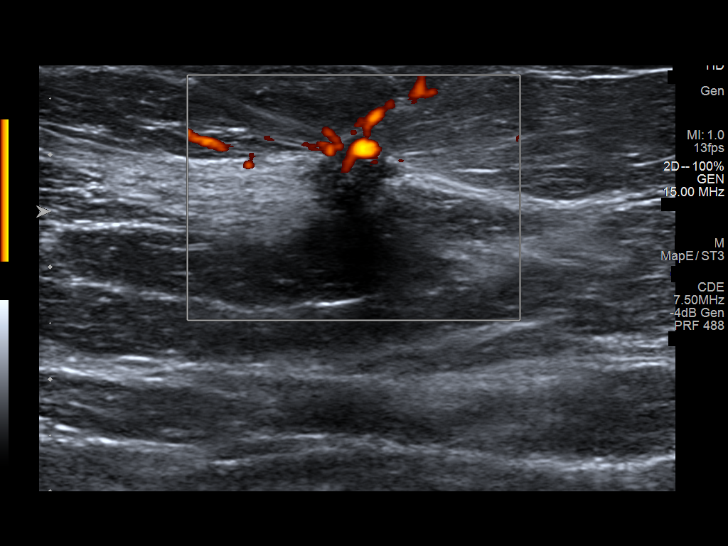
[im 4/12]
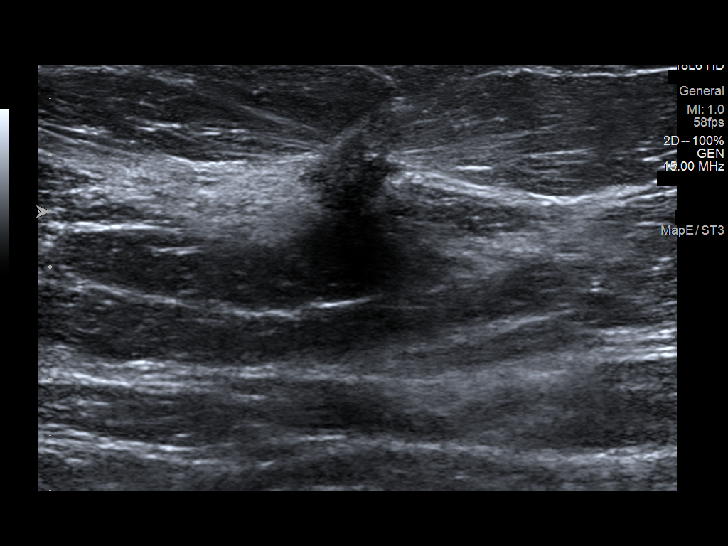
[im 5/12]
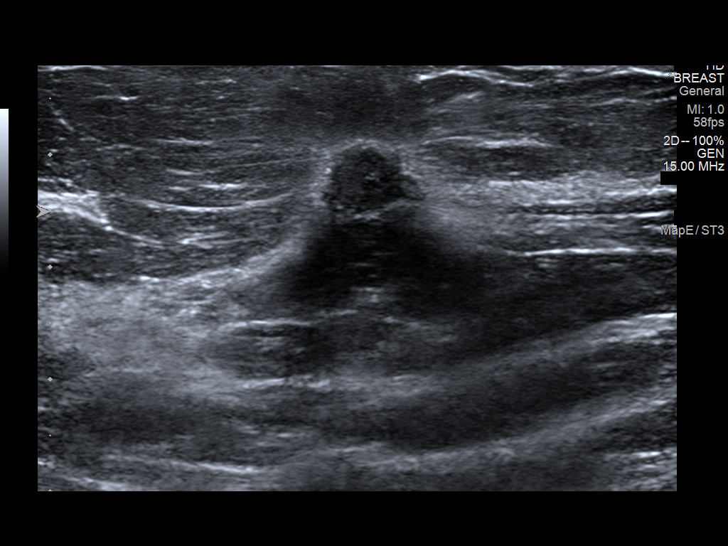
[im 6/12]
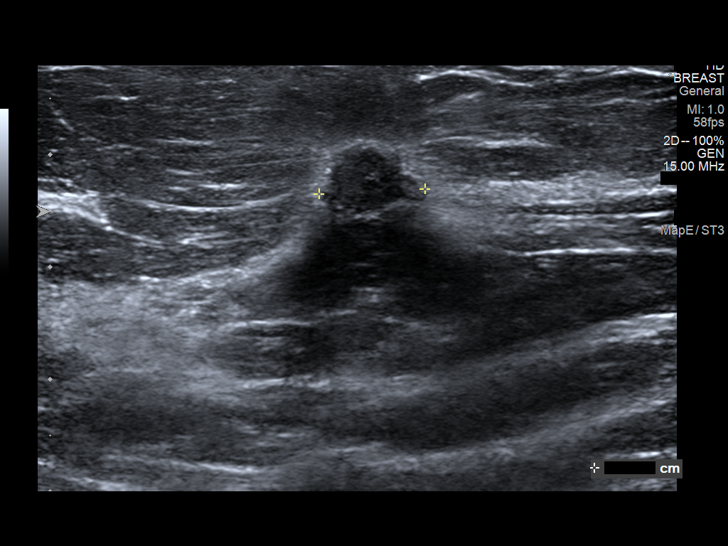
[im 7/12]
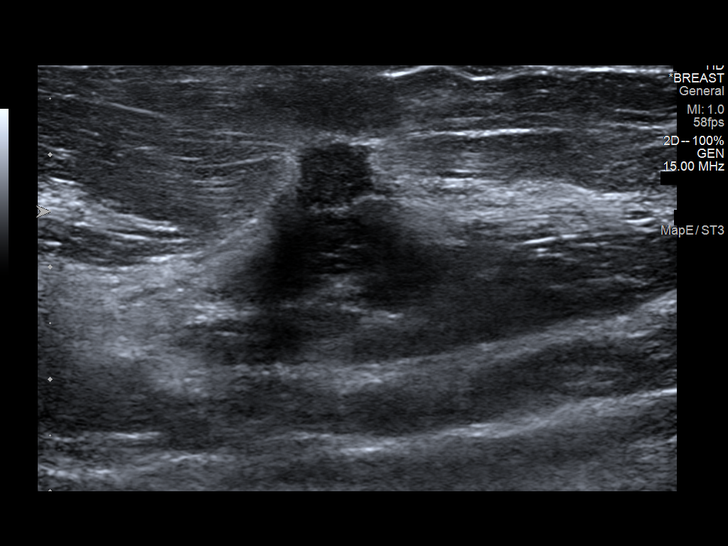
[im 8/12]
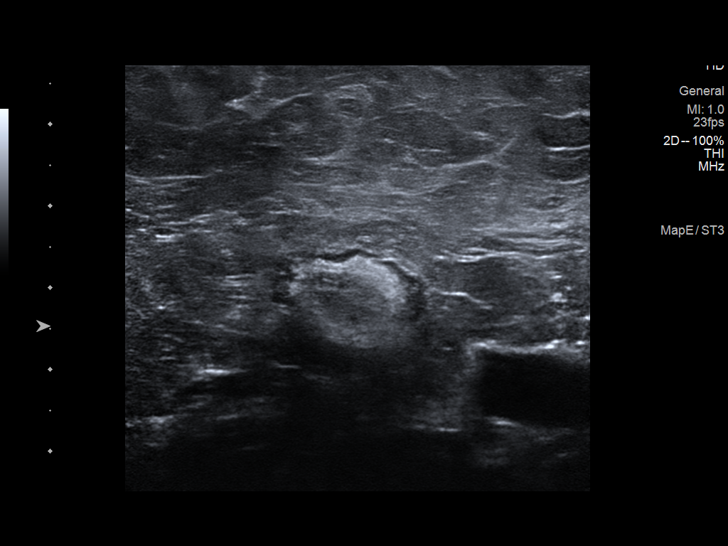
[im 9/12]
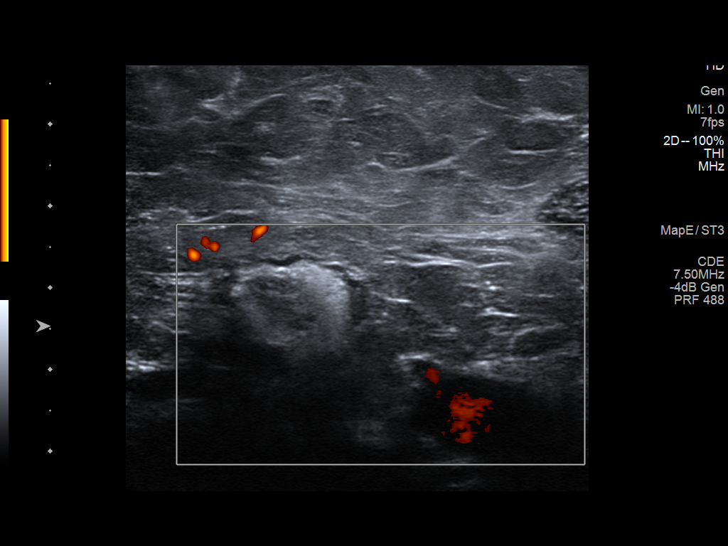
[im 10/12]
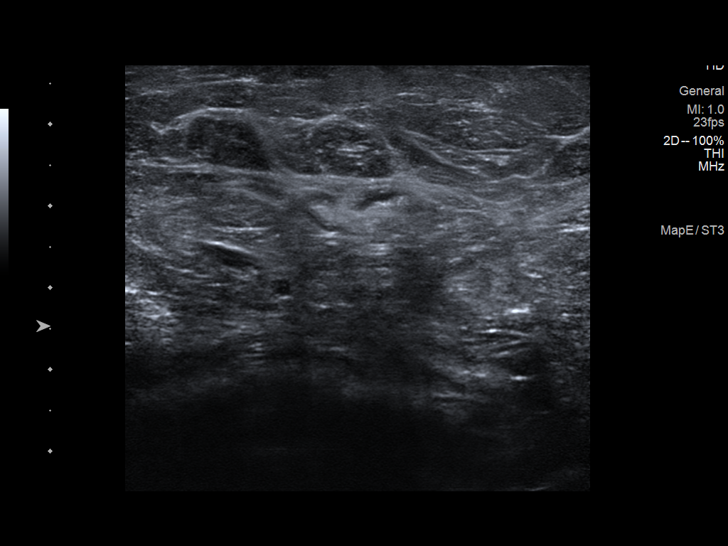
[im 11/12]
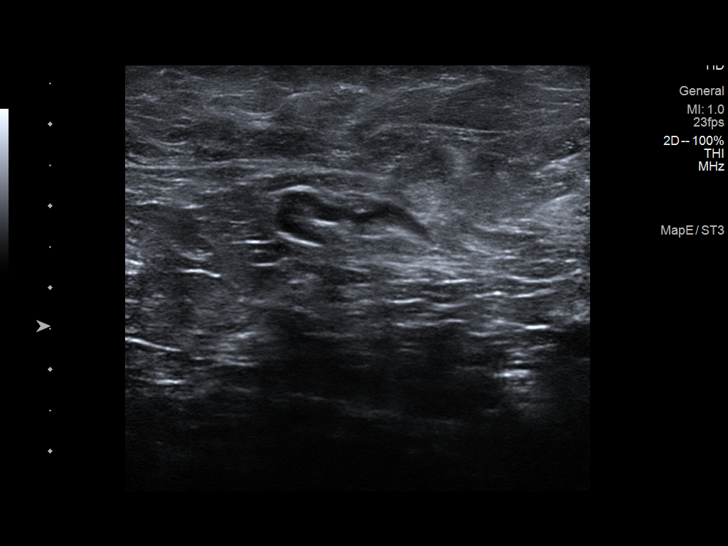
[im 12/12]
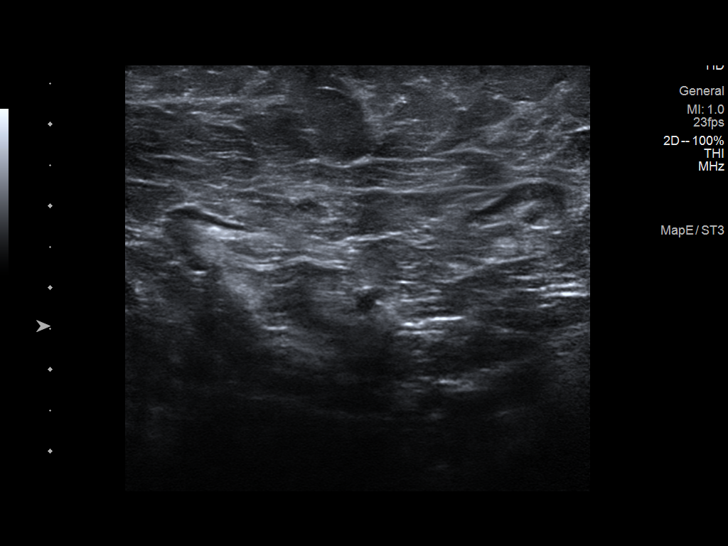

[12 of 12 positions shown; findings below may reference images not displayed]

ACR Breast Density Category b: There are scattered areas of
fibroglandular density.
FINDINGS: There is a mass with associated distortion in the upper outer right
breast. There is a group of calcifications in the inferior central
left breast which are new compared to 5144.

On physical exam, no suspicious lumps are identified.

Targeted ultrasound is performed, showing a mass with associated
distortion in the right breast at [DATE], 2 cm from the nipple
measuring 8 x 6 by 9 mm. No axillary adenopathy.
IMPRESSION: Highly suspicious mass in the right breast with associated
distortion. Indeterminate calcifications in the inferior central
left breast, new since 5144.

RECOMMENDATION:
Recommend ultrasound-guided biopsy of the right breast mass.
Recommend stereotactic biopsy of the new left breast calcifications.

I have discussed the findings and recommendations with the patient.
If applicable, a reminder letter will be sent to the patient
regarding the next appointment.

BI-RADS CATEGORY  5: Highly suggestive of malignancy.

## 2022-01-15 ENCOUNTER — Telehealth: Payer: Self-pay | Admitting: Family Medicine

## 2022-01-15 NOTE — Telephone Encounter (Signed)
..   Encourage patient to contact the pharmacy for refills or they can request refills through [place]:  09/07/21  NEXT APPOINTMENT DATE: 03/12/22  MEDICATION: albuterol (VENTOLIN HFA) 108 (90 Base) MCG/ACT inhaler   Is the patient out of medication? Yes;Medication is expired  PHARMACY: [facility], [address] AT [address]  [place], [place] [address]  Phone:  [phone]  Fax:  [phone]   Let patient know to contact pharmacy at the end of the day to make sure medication is ready.  Please notify patient to allow 48-72 hours to process

## 2022-01-16 ENCOUNTER — Other Ambulatory Visit: Payer: Self-pay

## 2022-01-16 MED ORDER — ALBUTEROL SULFATE HFA 108 (90 BASE) MCG/ACT IN AERS: 1.0000 | INHALATION_SPRAY | RESPIRATORY_TRACT | 5 refills | Status: DC | PRN

## 2022-01-16 NOTE — Telephone Encounter (Signed)
Rx sent 

## 2022-02-22 ENCOUNTER — Other Ambulatory Visit: Payer: Self-pay | Admitting: Family Medicine

## 2022-03-04 ENCOUNTER — Other Ambulatory Visit: Payer: Self-pay | Admitting: Family Medicine

## 2022-03-09 ENCOUNTER — Other Ambulatory Visit: Payer: Self-pay | Admitting: Family Medicine

## 2022-03-12 ENCOUNTER — Encounter: Payer: Self-pay | Admitting: Family Medicine

## 2022-03-12 ENCOUNTER — Telehealth (INDEPENDENT_AMBULATORY_CARE_PROVIDER_SITE_OTHER): Payer: BC Managed Care – PPO | Admitting: Family Medicine

## 2022-03-12 ENCOUNTER — Encounter: Payer: Self-pay | Admitting: *Deleted

## 2022-03-12 VITALS — BP 112/75 | HR 106 | Temp 101.0°F | Ht 67.0 in | Wt 250.0 lb

## 2022-03-12 DIAGNOSIS — F339 Major depressive disorder, recurrent, unspecified: Secondary | ICD-10-CM | POA: Diagnosis not present

## 2022-03-12 DIAGNOSIS — U071 COVID-19: Secondary | ICD-10-CM

## 2022-03-12 DIAGNOSIS — I10 Essential (primary) hypertension: Secondary | ICD-10-CM | POA: Diagnosis not present

## 2022-03-12 DIAGNOSIS — F411 Generalized anxiety disorder: Secondary | ICD-10-CM

## 2022-03-12 DIAGNOSIS — K219 Gastro-esophageal reflux disease without esophagitis: Secondary | ICD-10-CM

## 2022-03-12 DIAGNOSIS — E538 Deficiency of other specified B group vitamins: Secondary | ICD-10-CM

## 2022-03-12 MED ORDER — OMEPRAZOLE 40 MG PO CPDR: 40.0000 mg | DELAYED_RELEASE_CAPSULE | Freq: Every day | ORAL | 3 refills | Status: DC

## 2022-03-12 NOTE — Progress Notes (Signed)
Virtual Visit via Video Note  Subjective  CC:  Chief Complaint  Patient presents with   Covid Positive    Pt stated that she tested + for COVID this morning. She has al the symptoms for COVID.      I connected with Teresa Dunn on 03/12/22 at  3:30 PM EDT by a video enabled telemedicine application and verified that I am speaking with the correct person using two identifiers. Location patient: Home Location provider: [facility] [place], Office Persons participating in the virtual visit: Teresa Dunn, Leamon Arnt, MD Darlina Rumpf CMA  I discussed the limitations of evaluation and management by telemedicine and the availability of in person appointments. The patient expressed understanding and agreed to proceed. HPI: Teresa Dunn is a 56 y.o. female who was contacted today to address the problems listed above in the chief complaint. 56 yo awoke with chills, uris sxs, cough, myalgias and was evaluated in urgent care today and diagnosed with COVID-19.  Treated with Paxlovid.  Has cough medicine.  Feels poorly but denies shortness of breath, wheezing, chest pain.  No nausea vomiting or diarrhea.  Appetite is fair. Had follow-up visit for hypertension today schedule: Reports blood pressure at home remains normal.  Continues on lisinopril 40 mg daily.  No adverse effects.  No chest pain or shortness of breath. Chronic mood disorder: Depression and anxiety well-controlled on Zoloft 50 daily.  Doing well with home life and work life.  Of note, her son got married this weekend. GERD: Well-controlled on omeprazole 40 daily.  Needs refill.  She is taking B12 supplement now.  Assessment  1. COVID-19 virus infection   2. Essential hypertension   3. Generalized anxiety disorder   4. Major depression, recurrent, chronic ([place])   5. Gastroesophageal reflux disease, unspecified whether esophagitis present   6. Vitamin B12 deficiency      Plan   COVID-19 infection: Concur with Paxlovid.  Hold wixela while on paxlovid.  May use albuterol as needed.  Supportive care.  Isolation discussed.  Follow-up if worsening. Hypertension is well controlled on lisinopril 40.  Continue Mood disorder well-controlled.  Continue Zoloft Refilled omeprazole 40 mg daily.  I discussed the assessment and treatment plan with the patient. The patient was provided an opportunity to ask questions and all were answered. The patient agreed with the plan and demonstrated an understanding of the instructions.   The patient was advised to call back or seek an in-person evaluation if the symptoms worsen or if the condition fails to improve as anticipated. Follow up: 6 months for complete physical and follow-up Visit date not found  Meds ordered this encounter  Medications   omeprazole (PRILOSEC) 40 MG capsule    Sig: Take 1 capsule (40 mg total) by mouth daily.    Dispense:  180 capsule    Refill:  3      I reviewed the patients updated PMH, FH, and SocHx.    Patient Active Problem List   Diagnosis Date Noted   Malignant neoplasm of upper-outer quadrant of right breast in female, estrogen receptor positive ([place]) 11/18/2019    Priority: High   Obesity (BMI 30-39.9) 03/27/2018    Priority: High   Essential hypertension 02/25/2018    Priority: High   Former heavy tobacco smoker 02/25/2018    Priority: High   Generalized anxiety disorder 10/10/2010    Priority: High   Moderate persistent asthma 02/25/2018  Priority: Medium    Chronic mixed headache syndrome 08/07/2012    Priority: Medium    GERD (gastroesophageal reflux disease) 06/26/2012    Priority: Medium    Major depression, recurrent, chronic ([place]) 10/23/2010    Priority: Medium    Primary osteoarthritis of right knee 09/02/2019    Priority: Low   Lipoma of abdominal wall 02/25/2018    Priority: Low   Plantar fasciitis of left foot 11/30/2016    Priority: Low   Perennial allergic  rhinitis 08/07/2012    Priority: Low   Vitamin B12 deficiency 03/09/2021   History of total knee replacement, left 06/22/2020   Family history of breast cancer    Current Meds  Medication Sig   albuterol (VENTOLIN HFA) 108 (90 Base) MCG/ACT inhaler Inhale 1-2 puffs into the lungs every 4 (four) hours as needed for wheezing or shortness of breath.   ALPRAZolam (XANAX) 0.5 MG tablet Take 1 tablet (0.5 mg total) by mouth daily as needed for anxiety.   celecoxib (CELEBREX) 200 MG capsule celecoxib 200 mg capsule  TAKE 1 CAPSULE BY MOUTH EVERY DAY   lisinopril (ZESTRIL) 40 MG tablet TAKE 1 TABLET(40 MG) BY MOUTH AT BEDTIME   loratadine (CLARITIN) 10 MG tablet Take 10 mg by mouth daily as needed for allergies.    montelukast (SINGULAIR) 10 MG tablet TAKE 1 TABLET(10 MG) BY MOUTH AT BEDTIME   sertraline (ZOLOFT) 50 MG tablet TAKE 1 TABLET(50 MG) BY MOUTH AT BEDTIME   triamcinolone (NASACORT ALLERGY 24HR) 55 MCG/ACT AERO nasal inhaler Place 1 spray into the nose daily as needed (congestion/allergies.).   [DISCONTINUED] Naproxen Sodium 220 MG CAPS Aleve   [DISCONTINUED] omeprazole (PRILOSEC) 40 MG capsule Take 1 capsule (40 mg total) by mouth daily.    Allergies: Patient is allergic to codeine and asa [aspirin]. Family History: Patient family history includes ADD / ADHD in her son; Alcohol abuse in her mother; Asthma in her father; Bipolar disorder in her mother; Breast cancer in her maternal grandmother and paternal grandmother; Cancer in her maternal aunt; Cervical cancer in her sister; Diabetes in her father and mother; Healthy in her brother and son; Heart disease in her father and mother; Hypertension in her father and mother; Lung cancer in her maternal grandfather and mother; Pneumonia in her paternal uncle; Skin cancer in her sister; Stroke in her maternal grandmother. Social History:  Patient  reports that she quit smoking about 7 years ago. Her smoking use included cigarettes. She has a  45.00 pack-year smoking history. She has never used smokeless tobacco. She reports current alcohol use. She reports that she does not use drugs.  Review of Systems: Constitutional: Negative for fever malaise or anorexia Cardiovascular: negative for chest pain Respiratory: negative for SOB or persistent cough Gastrointestinal: negative for abdominal pain  OBJECTIVE Vitals: BP 112/75   Pulse (!) 106   Temp (!) 101 F (38.3 C)   Ht '5\' 7"'$  (1.702 m)   Wt 250 lb (113.4 kg)   LMP 06/27/2019 (Approximate)   SpO2 98%   BMI 39.16 kg/m  General: no acute distress , A&Ox3 Congested, no coughing, no respiratory distress.  Leamon Arnt, MD

## 2022-05-23 ENCOUNTER — Other Ambulatory Visit: Payer: Self-pay | Admitting: Family Medicine

## 2022-05-31 ENCOUNTER — Encounter: Payer: Self-pay | Admitting: *Deleted

## 2022-06-13 ENCOUNTER — Telehealth: Payer: Self-pay | Admitting: Family Medicine

## 2022-06-13 NOTE — Telephone Encounter (Signed)
  Encourage patient to contact the pharmacy for refills or they can request refills through [place]:  Please schedule appointment if longer than 1 year  NEXT APPOINTMENT DATE: 3/24  MEDICATION: ADVAIR DISKUS 250-50 MCG/ACT AEPB   Is the patient out of medication? Yes  PHARMACY:  [id number] - [address], [place] AT [address] Phone: [phone]  Fax: [phone]      Let patient know to contact pharmacy at the end of the day to make sure medication is ready.  Please notify patient to allow 48-72 hours to process

## 2022-06-20 MED ORDER — FLUTICASONE-SALMETEROL 250-50 MCG/ACT IN AEPB: 1.0000 | INHALATION_SPRAY | Freq: Two times a day (BID) | RESPIRATORY_TRACT | 3 refills | Status: DC

## 2022-09-10 ENCOUNTER — Encounter: Payer: BC Managed Care – PPO | Admitting: Family Medicine

## 2022-10-31 ENCOUNTER — Ambulatory Visit (INDEPENDENT_AMBULATORY_CARE_PROVIDER_SITE_OTHER): Payer: BC Managed Care – PPO | Admitting: Family Medicine

## 2022-10-31 ENCOUNTER — Other Ambulatory Visit (HOSPITAL_COMMUNITY)
Admission: RE | Admit: 2022-10-31 | Discharge: 2022-10-31 | Disposition: A | Discharge status: Home / Self Care | Payer: BC Managed Care – PPO | Source: Ambulatory Visit | Attending: Family Medicine | Admitting: Family Medicine

## 2022-10-31 ENCOUNTER — Encounter: Payer: Self-pay | Admitting: Family Medicine

## 2022-10-31 VITALS — BP 142/72 | HR 74 | Temp 98.3°F | Ht 67.0 in | Wt 250.0 lb

## 2022-10-31 DIAGNOSIS — J4540 Moderate persistent asthma, uncomplicated: Secondary | ICD-10-CM | POA: Diagnosis not present

## 2022-10-31 DIAGNOSIS — Z87891 Personal history of nicotine dependence: Secondary | ICD-10-CM

## 2022-10-31 DIAGNOSIS — F339 Major depressive disorder, recurrent, unspecified: Secondary | ICD-10-CM

## 2022-10-31 DIAGNOSIS — Z0000 Encounter for general adult medical examination without abnormal findings: Secondary | ICD-10-CM

## 2022-10-31 DIAGNOSIS — Z124 Encounter for screening for malignant neoplasm of cervix: Secondary | ICD-10-CM

## 2022-10-31 DIAGNOSIS — F411 Generalized anxiety disorder: Secondary | ICD-10-CM

## 2022-10-31 DIAGNOSIS — E538 Deficiency of other specified B group vitamins: Secondary | ICD-10-CM

## 2022-10-31 DIAGNOSIS — Z23 Encounter for immunization: Secondary | ICD-10-CM | POA: Diagnosis not present

## 2022-10-31 DIAGNOSIS — Z170 Estrogen receptor positive status [ER+]: Secondary | ICD-10-CM

## 2022-10-31 DIAGNOSIS — I10 Essential (primary) hypertension: Secondary | ICD-10-CM

## 2022-10-31 DIAGNOSIS — Z1211 Encounter for screening for malignant neoplasm of colon: Secondary | ICD-10-CM

## 2022-10-31 LAB — TSH: TSH: 0.95 u[IU]/mL (ref 0.35–5.50)

## 2022-10-31 LAB — CBC WITH DIFFERENTIAL/PLATELET
Basophils Absolute: 0 10*3/uL (ref 0.0–0.1)
Basophils Relative: 0.7 % (ref 0.0–3.0)
Eosinophils Absolute: 0.1 10*3/uL (ref 0.0–0.7)
Eosinophils Relative: 2.6 % (ref 0.0–5.0)
HCT: 38.4 % (ref 36.0–46.0)
Hemoglobin: 12.9 g/dL (ref 12.0–15.0)
Lymphocytes Relative: 19.6 % (ref 12.0–46.0)
Lymphs Abs: 1 10*3/uL (ref 0.7–4.0)
MCHC: 33.7 g/dL (ref 30.0–36.0)
MCV: 91.6 fl (ref 78.0–100.0)
Monocytes Absolute: 0.4 10*3/uL (ref 0.1–1.0)
Monocytes Relative: 8.5 % (ref 3.0–12.0)
Neutro Abs: 3.3 10*3/uL (ref 1.4–7.7)
Neutrophils Relative %: 68.6 % (ref 43.0–77.0)
Platelets: 281 10*3/uL (ref 150.0–400.0)
RBC: 4.19 Mil/uL (ref 3.87–5.11)
RDW: 14 % (ref 11.5–15.5)
WBC: 4.9 10*3/uL (ref 4.0–10.5)

## 2022-10-31 LAB — COMPREHENSIVE METABOLIC PANEL
ALT: 23 U/L (ref 0–35)
AST: 19 U/L (ref 0–37)
Albumin: 4 g/dL (ref 3.5–5.2)
Alkaline Phosphatase: 110 U/L (ref 39–117)
BUN: 13 mg/dL (ref 6–23)
CO2: 29 mEq/L (ref 19–32)
Calcium: 9.8 mg/dL (ref 8.4–10.5)
Chloride: 103 mEq/L (ref 96–112)
Creatinine, Ser: 0.69 mg/dL (ref 0.40–1.20)
GFR: 97.6 mL/min (ref >60.00)
Glucose, Bld: 111 mg/dL — ABNORMAL HIGH (ref 70–99)
Potassium: 4.4 mEq/L (ref 3.5–5.1)
Sodium: 140 mEq/L (ref 135–145)
Total Bilirubin: 0.8 mg/dL (ref 0.2–1.2)
Total Protein: 6.6 g/dL (ref 6.0–8.3)

## 2022-10-31 LAB — LIPID PANEL
Cholesterol: 200 mg/dL (ref 0–200)
HDL: 80.8 mg/dL (ref >39.00)
LDL Cholesterol: 109 mg/dL — ABNORMAL HIGH (ref 0–99)
NonHDL: 119.11
Total CHOL/HDL Ratio: 2
Triglycerides: 52 mg/dL (ref 0.0–149.0)
VLDL: 10.4 mg/dL (ref 0.0–40.0)

## 2022-10-31 LAB — VITAMIN B12: Vitamin B-12: 670 pg/mL (ref 211–911)

## 2022-10-31 MED ORDER — LISINOPRIL-HYDROCHLOROTHIAZIDE 20-12.5 MG PO TABS: 2.0000 | ORAL_TABLET | Freq: Every day | ORAL | 3 refills | Status: DC

## 2022-10-31 NOTE — Patient Instructions (Addendum)
Please return in 3 months for hypertension recheck   I will release your lab results to you on your MyChart account with further instructions. You may see the results before I do, but when I review them I will send you a message with my report or have my assistant call you if things need to be discussed. Please reply to my message with any questions. Thank you!   I recommend the Cologuard test for your colon cancer screening that is due. I have ordered this test for you. The Cologuard company will soon contact you to verify your insurance, address etc. They will then send you the kit; follow the instructions in the kit and return the kit to Colog[employer]. They will run the test and send the results to me. I will then give you the results. If this test is negative, we recommend repeating a colon cancer screening test in 3 years. If it is positive, I will refer you to a Gastroenterologist so you can get set up for the recommended colonoscopy.  Thank you!   I have referred you for lung cancer screening again. We will call you to get scheduled.   Today you were given your Tdap vaccination.  This is good for 10 years.  I have changed your blood pressure pill to a combination pill. Please take 2 tabs together every morning.   If you have any questions or concerns, please don't hesitate to send me a message via MyChart or call the office at [phone]. Thank you for visiting with [place] today! It's our pleasure caring for you.

## 2022-10-31 NOTE — Progress Notes (Signed)
Subjective  Chief Complaint  Patient presents with   Annual Exam    Pt here for Annual Exam and is currently fasting     HPI: Teresa Dunn is a 57 y.o. female who presents to [facility] [place] today for a Female Wellness Visit. She also has the concerns and/or needs as listed above in the chief complaint. These will be addressed in addition to the Health Maintenance Visit.   Wellness Visit: annual visit with health maintenance review and exam with Pap  Health maintenance: Pap smear due.  Colorectal cancer screening due, elects Cologuard.  Had -3 years ago.  Mammogram scheduled.  Eligible for lung cancer screening with 30-pack-year history quit in 2016.  No symptoms.  Eligible for Tdap today.  Other immunizations up-to-date. Chronic disease f/u and/or acute problem visit: (deemed necessary to be done in addition to the wellness visit): Obesity: Has trouble losing weight.  Diet is poor. Hypertension: Does not check at home.  Feels well.  No chest pain or shortness of breath.  Takes lisinopril 40 daily. Anxiety and depression are well-controlled on her mood medications. Had an exacerbation of her asthma few months ago.  Now has stabilized.  Rare albuterol use. Taking B12 supplements for B12 deficiency.  Assessment  1. Annual physical exam   2. Need for Tdap vaccination   3. Essential hypertension   4. Generalized anxiety disorder   5. Malignant neoplasm of upper-outer quadrant of right breast in female, estrogen receptor positive ([facility])   6. Major depression, recurrent, chronic ([facility])   7. Moderate persistent asthma without complication   8. Vitamin B12 deficiency   9. Former heavy tobacco smoker   10. Screening for colorectal cancer   11. Cervical cancer screening      Plan  Female Wellness Visit: Age appropriate Health Maintenance and Prevention measures were discussed with patient. Included topics are cancer screening recommendations, ways to keep  healthy (see AVS) including dietary and exercise recommendations, regular eye and dental care, use of seat belts, and avoidance of moderate alcohol use and tobacco use.  Pap smear with high-risk HPV today.  Mammogram scheduled.  Cologuard ordered BMI: discussed patient's BMI and encouraged positive lifestyle modifications to help get to or maintain a target BMI. HM needs and immunizations were addressed and ordered. See below for orders. See HM and immunization section for updates.  Tdap today Routine labs and screening tests ordered including cmp, cbc and lipids where appropriate. Discussed recommendations regarding Vit D and calcium supplementation (see AVS)  Chronic disease management visit and/or acute problem visit: Hypertension with marginal control: Change from lisinopril 40 to lisinopril-HCTZ 40/25.  Check renal function electrolytes.  Low-sodium diet and weight loss recommended Check fasting lipid panel. Monitor vitamin B12 and D levels Continue sertraline 50 mg daily for mood control.  Working well. Asthma has now stabilized.  Monitor for recurrent exacerbations.  No indication for maintenance inhaler at this time  Outpatient Encounter Medications as of 10/31/2022  Medication Sig   albuterol (VENTOLIN HFA) 108 (90 Base) MCG/ACT inhaler Inhale 1-2 puffs into the lungs every 4 (four) hours as needed for wheezing or shortness of breath.   ALPRAZolam (XANAX) 0.5 MG tablet Take 1 tablet (0.5 mg total) by mouth daily as needed for anxiety.   celecoxib (CELEBREX) 200 MG capsule celecoxib 200 mg capsule  TAKE 1 CAPSULE BY MOUTH EVERY DAY   fluticasone-salmeterol (ADVAIR DISKUS) 250-50 MCG/ACT AEPB Inhale 1 puff into the lungs in the morning  and at bedtime.   lisinopril-hydrochlorothiazide (ZESTORETIC) 20-12.5 MG tablet Take 2 tablets by mouth daily.   loratadine (CLARITIN) 10 MG tablet Take 10 mg by mouth daily as needed for allergies.    montelukast (SINGULAIR) 10 MG tablet TAKE 1 TABLET(10  MG) BY MOUTH AT BEDTIME   omeprazole (PRILOSEC) 40 MG capsule Take 1 capsule (40 mg total) by mouth daily.   sertraline (ZOLOFT) 50 MG tablet TAKE 1 TABLET(50 MG) BY MOUTH AT BEDTIME   triamcinolone (NASACORT ALLERGY 24HR) 55 MCG/ACT AERO nasal inhaler Place 1 spray into the nose daily as needed (congestion/allergies.).   [DISCONTINUED] lisinopril (ZESTRIL) 40 MG tablet TAKE 1 TABLET(40 MG) BY MOUTH AT BEDTIME   No facility-administered encounter medications on file as of 10/31/2022.    Follow up: 3 months to recheck blood pressure and new medication with lab work Orders Placed This Encounter  Procedures   Tdap vaccine greater than or equal to 7yo IM   CBC with Differential/Platelet   Comprehensive metabolic panel   Lipid panel   TSH   Vitamin B12   Cologuard   Ambulatory Referral for Lung Cancer Scre   Meds ordered this encounter  Medications   lisinopril-hydrochlorothiazide (ZESTORETIC) 20-12.5 MG tablet    Sig: Take 2 tablets by mouth daily.    Dispense:  180 tablet    Refill:  3      Body mass index is 39.16 kg/m. Wt Readings from Last 3 Encounters:  10/31/22 250 lb (113.4 kg)  03/12/22 250 lb (113.4 kg)  09/07/21 246 lb 9.6 oz (111.9 kg)     Patient Active Problem List   Diagnosis Date Noted   Family history of breast cancer     Priority: High   Malignant neoplasm of upper-outer quadrant of right breast in female, estrogen receptor positive ([facility]) 11/18/2019    Priority: High   Obesity (BMI 30-39.9) 03/27/2018    Priority: High   Essential hypertension 02/25/2018    Priority: High   Former heavy tobacco smoker 02/25/2018    Priority: High    1.5 ppd x 30 years. Quit 2016    Generalized anxiety disorder 10/10/2010    Priority: High    cymbalta - weight gain and hot flushes wellbutrin failed, started zoloft January 2020.     Moderate persistent asthma 02/25/2018    Priority: Medium    Chronic mixed headache syndrome 08/07/2012    Priority: Medium    GERD  (gastroesophageal reflux disease) 06/26/2012    Priority: Medium    Major depression, recurrent, chronic (HCC) 10/23/2010    Priority: Medium    Vitamin B12 deficiency 03/09/2021    Priority: Low    Chronic ppi;rec oral b12 02/2021    History of total knee replacement, left 06/22/2020    Priority: Low   Primary osteoarthritis of right knee 09/02/2019    Priority: Low   Lipoma of abdominal wall 02/25/2018    Priority: Low   Plantar fasciitis of left foot 11/30/2016    Priority: Low   Perennial allergic rhinitis 08/07/2012    Priority: Low   Health Maintenance  Topic Date Due   Lung Cancer Screening  05/25/2017   Fecal DNA (Cologuard)  04/29/2022   MAMMOGRAM  11/23/2022   INFLUENZA VACCINE  01/17/2023   PAP SMEAR-Modifier  03/28/2023   DTaP/Tdap/Td (3 - Td or Tdap) 10/30/2032   Hepatitis C Screening  Completed   HIV Screening  Completed   Zoster Vaccines- Shingrix  Completed   HPV VACCINES  Aged Out   COVID-19 Vaccine  Discontinued   Immunization History  Administered Date(s) Administered   Influenza Split 03/26/2012   Influenza,inj,Quad PF,6+ Mos 02/25/2018, 02/11/2019, 03/07/2020, 03/08/2021   Influenza,trivalent, recombinat, inj, PF 03/29/2011   PFIZER Comirnaty(Gray Top)Covid-19 Tri-Sucrose Vaccine 09/03/2020   PFIZER(Purple Top)SARS-COV-2 Vaccination 08/22/2019, 09/12/2019   Pneumococcal Conjugate-13 06/27/2007   Tdap 06/27/2011, 10/31/2022   Zoster Recombinat (Shingrix) 09/02/2020, 03/08/2021   We updated and reviewed the patient's past history in detail and it is documented below. Allergies: Patient is allergic to codeine and asa [aspirin]. 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 Past Surgical History Patient  has a past surgical history that includes Tubal ligation; Shoulder arthroscopy with subacromial decompression (Left, 11/18/2014); Knee surgery; Cervix lesion destruction (1990); Breast lumpectomy with radioactive seed and sentinel lymph node biopsy (Bilateral, 12/03/2019); Joint replacement; Breast biopsy (Right, 11/09/2019); Breast biopsy (Left, 11/11/2019); Breast lumpectomy (Right, 12/03/2019); Breast excisional biopsy (Left, 12/03/2019); and Knee arthroscopy w/ meniscectomy (Right, 09/2020). Family History: Patient family history includes ADD / ADHD in her son; Alcohol abuse in her mother; Asthma in her father; Bipolar disorder in her mother; Breast cancer in her maternal grandmother and paternal grandmother; Cancer in her maternal aunt; Cervical cancer in her sister; Diabetes in her father and mother; Healthy in her brother and son; Heart disease in her father and mother; Hypertension in her father and mother; Lung cancer in her maternal grandfather and mother; Pneumonia in her paternal uncle; Skin cancer in her sister; Stroke in her maternal grandmother. Social History:  Patient  reports that she quit smoking about 8 years ago. Her smoking use included cigarettes. She has a 45.00 pack-year smoking history. She has never used smokeless tobacco. She reports current alcohol use. She reports that she does not use drugs.  Review of Systems: Constitutional: negative for fever or malaise Ophthalmic: negative for photophobia, double vision or loss of vision Cardiovascular: negative for chest pain, dyspnea on exertion, or new LE swelling Respiratory: negative for SOB or persistent cough Gastrointestinal: negative for abdominal pain,  change in bowel habits or melena Genitourinary: negative for dysuria or gross hematuria, no abnormal uterine bleeding or disharge Musculoskeletal: negative for new gait disturbance or muscular weakness Integumentary: negative for new or persistent rashes, no breast lumps Neurological: negative for TIA or stroke symptoms Psychiatric: negative for SI or delusions Allergic/Immunologic: negative for hives  Patient Care Team    Relationship Specialty Notifications Start End  Willow Ora, MD PCP - General Family Medicine  02/25/18   Harriette Bouillon, MD Consulting Physician General Surgery  11/17/19   Magrinat, Valentino Hue, MD (Inactive) Consulting Physician Oncology  11/25/19   Dorothy Puffer, MD Consulting Physician Radiation Oncology  11/25/19   Pershing Proud, RN Oncology Nurse Navigator   11/27/19   Donnelly Angelica, RN Oncology Nurse Navigator   11/27/19     Objective  Vitals: BP (!) 142/72   Pulse 74   Temp 98.3 F (36.8 C)   Ht 5\' 7"  (1.702 m)   Wt 250 lb (113.4 kg)   LMP 06/27/2019 (Approximate)   SpO2 96%  BMI 39.16 kg/m  General:  Well developed, well nourished, no acute distress  Psych:  Alert and orientedx3,normal mood and affect, happy HEENT:  Normocephalic, atraumatic, non-icteric sclera,  supple neck without adenopathy, mass or thyromegaly Cardiovascular:  Normal S1, S2, RRR without gallop, rub or murmur Respiratory:  Good breath sounds bilaterally, CTAB with normal respiratory effort Gastrointestinal: normal bowel sounds, soft, non-tender, no noted masses. No HSM MSK: extremities without edema, joints without erythema or swelling Neurologic:    Mental status is normal.  Gross motor and sensory exams are normal.  No tremor Pelvic Exam: Normal external genitalia, no vulvar or vaginal lesions present. Clear cervix w/o CMT. Bimanual exam reveals a nontender fundus w/o masses, nl size. No adnexal masses present. No inguinal adenopathy. A PAP smear was performed.    Commons side  effects, risks, benefits, and alternatives for medications and treatment plan prescribed today were discussed, and the patient expressed understanding of the given instructions. Patient is instructed to call or message via MyChart if he/she has any questions or concerns regarding our treatment plan. No barriers to understanding were identified. We discussed Red Flag symptoms and signs in detail. Patient expressed understanding regarding what to do in case of urgent or emergency type symptoms.  Medication list was reconciled, printed and provided to the patient in AVS. Patient instructions and summary information was reviewed with the patient as documented in the AVS. This note was prepared with assistance of Dragon voice recognition software. Occasional wrong-word or sound-a-like substitutions may have occurred due to the inherent limitations of voice recognition software

## 2022-11-01 NOTE — Progress Notes (Signed)
Please add on a1c, dx: hyperglycemia Thanks, Dr. Iliza Blankenbeckler '

## 2022-11-04 NOTE — Progress Notes (Signed)
Labs reviewed.  The 10-year ASCVD risk score (Arnett DK, et al., 2019) is: 2.3%   Values used to calculate the score:     Age: 57 years     Sex: Female     Is Non-Hispanic African American: No     Diabetic: No     Tobacco smoker: No     Systolic Blood Pressure: 142 mmHg     Is BP treated: Yes     HDL Cholesterol: 80.8 mg/dL     Total Cholesterol: 200 mg/dL

## 2022-11-06 ENCOUNTER — Other Ambulatory Visit: Payer: Self-pay

## 2022-11-06 DIAGNOSIS — R739 Hyperglycemia, unspecified: Secondary | ICD-10-CM

## 2022-11-06 LAB — CYTOLOGY - PAP
Adequacy: ABSENT
Comment: NEGATIVE
Diagnosis: NEGATIVE
High risk HPV: NEGATIVE

## 2022-11-07 NOTE — Progress Notes (Signed)
Nl pap and neg HR HPV screen; mychart note sent. Repeat in 5 years.

## 2022-11-09 ENCOUNTER — Telehealth: Payer: Self-pay | Admitting: Family Medicine

## 2022-11-09 NOTE — Telephone Encounter (Signed)
Final Outcome: Go to ED Now. See below message for update.   Patient Name First: Teresa Last: Dunn Gender: Female DOB: 05/22/1966 Age: 57 Y 7 M 5 D Return Phone Number: [phone] (Primary) Address: City/ State/ Zip: [place]  [address] Client [facility] - Administrator, [facility] Provider Asencion Partridge- MD Contact Type Call Who Is Calling Patient / Member / Family / Caregiver Call Type Triage / Clinical Relationship To Patient Self Return Phone Number [phone] (Primary) Chief Complaint Blood Pressure Low Reason for Call Symptomatic / Request for Health Information Initial Comment Caller states her BP reading 106/60 and she is experiencing dizziness and she has a headache. Translation No Nurse Assessment Nurse: Andrey Campanile, RN, Marylene Land Date/Time Lamount Cohen Time): 11/09/2022 1:01:25 PM Confirm and document reason for call. If symptomatic, describe symptoms. ---Caller states her BP reading 106/60 and she is experiencing dizziness and she has a headache. Symptoms started an hour ago. MD just changed it from Lisinopril 40mg  to Lisinopril with Hydrochlorothiazide. Usual b/p was 140/70. Does the patient have any new or worsening symptoms? ---Yes Will a triage be completed? ---Yes Related visit to physician within the last 2 weeks? ---No Does the PT have any chronic conditions? (i.e. diabetes, asthma, this includes High risk factors for pregnancy, etc.) ---Yes List chronic conditions. ---HTN, Asthma, GERD Is this a behavioral health or substance abuse call? ---No Guidelines Guideline Title Affirmed Question Affirmed Notes Nurse Date/Time (Eastern Time) Blood Pressure - Low [1] Fall in systolic BP > 20 mm Hg from normal AND [2] dizzy, lightheaded, or weak Andrey Campanile, RN, Marylene Land 11/09/2022 1:05:57 PM PLEASE NOTE: All timestamps contained within this report are represented as [place] Standard  Time. CONFIDENTIALTY NOTICE: This fax transmission is intended only for the addressee. It contains information that is legally privileged, confidential or otherwise protected from use or disclosure. If you are not the intended recipient, you are strictly prohibited from reviewing, disclosing, copying using or disseminating any of this information or taking any action in reliance on or regarding this information. If you have received this fax in error, please notify us immediately by telephone so that we can arrange for its return to [place]. Phone: [phone], Toll-Free: [phone], Fax: [phone] Page: 2 of 2 Call Id: [id number] Disp. Time Lamount Cohen Time) Disposition Final User 11/09/2022 1:09:17 PM Go to ED Now (or PCP triage) Yes Andrey Campanile, RN, Marylene Land Final Disposition 11/09/2022 1:09:17 PM Go to ED Now (or PCP triage) Thelma Barge, RN, Rosalyn Charters Disagree/Comply Disagree Caller Understands Yes PreDisposition Did not know what to do Care Advice Given Per Guideline GO TO ED NOW (OR PCP TRIAGE): * IF NO PCP (PRIMARY CARE PROVIDER) SECOND-LEVEL TRIAGE: You need to be seen within the next hour. Go to the ED/UCC at _____________ Hospital. Leave as soon as you can. Comments User: Teresa Dice, RN Date/Time Lamount Cohen Time): 11/09/2022 1:12:35 PM Notified office that patient cannot go to the ER due to inability to leave work. Referrals GO TO FACILITY REFUSED

## 2022-11-09 NOTE — Telephone Encounter (Signed)
Marylene Land, Access nurse, states final outcome is to go to the ED. States pt verbalized understanding but informed her she is unable to leave work. Awaiting triage note. Please Advise.

## 2022-11-09 NOTE — Telephone Encounter (Signed)
FYI: This call has been transferred to Access Nurse. Once the result note has been entered staff can address the message at that time.  Patient called in with the following symptoms:  Red Word:dizziness, light headache, and low BP reading from baseline; 106/60. States feels like everything is closing in around her.    Please advise at Mobile [phone] (mobile)  Message is routed to Provider Pool and [facility] Triage

## 2023-01-04 ENCOUNTER — Other Ambulatory Visit: Payer: Self-pay | Admitting: Family Medicine

## 2023-01-04 DIAGNOSIS — Z1231 Encounter for screening mammogram for malignant neoplasm of breast: Secondary | ICD-10-CM

## 2023-01-08 ENCOUNTER — Ambulatory Visit
Admission: RE | Admit: 2023-01-08 | Discharge: 2023-01-08 | Disposition: A | Discharge status: Home / Self Care | Payer: BC Managed Care – PPO | Source: Ambulatory Visit | Attending: Family Medicine | Admitting: Family Medicine

## 2023-01-08 DIAGNOSIS — Z1231 Encounter for screening mammogram for malignant neoplasm of breast: Secondary | ICD-10-CM

## 2023-01-25 ENCOUNTER — Ambulatory Visit: Payer: BC Managed Care – PPO | Admitting: Family Medicine

## 2023-03-12 ENCOUNTER — Telehealth: Payer: Self-pay | Admitting: Family Medicine

## 2023-03-13 NOTE — Telephone Encounter (Signed)
error 

## 2023-03-19 ENCOUNTER — Encounter: Payer: Self-pay | Admitting: Family Medicine

## 2023-03-19 ENCOUNTER — Ambulatory Visit: Payer: 59 | Admitting: Family Medicine

## 2023-03-19 VITALS — BP 138/84 | HR 64 | Temp 98.0°F | Ht 67.0 in | Wt 252.2 lb

## 2023-03-19 DIAGNOSIS — I10 Essential (primary) hypertension: Secondary | ICD-10-CM | POA: Diagnosis not present

## 2023-03-19 DIAGNOSIS — Z23 Encounter for immunization: Secondary | ICD-10-CM | POA: Diagnosis not present

## 2023-03-19 DIAGNOSIS — F411 Generalized anxiety disorder: Secondary | ICD-10-CM | POA: Diagnosis not present

## 2023-03-19 DIAGNOSIS — F339 Major depressive disorder, recurrent, unspecified: Secondary | ICD-10-CM

## 2023-03-19 MED ORDER — MONTELUKAST SODIUM 10 MG PO TABS: ORAL_TABLET | ORAL | 3 refills | Status: DC

## 2023-03-19 MED ORDER — LISINOPRIL-HYDROCHLOROTHIAZIDE 20-12.5 MG PO TABS: 2.0000 | ORAL_TABLET | Freq: Two times a day (BID) | ORAL | Status: DC

## 2023-03-19 MED ORDER — SERTRALINE HCL 100 MG PO TABS: 100.0000 mg | ORAL_TABLET | Freq: Every day | ORAL | 3 refills | Status: DC

## 2023-03-19 MED ORDER — FLUTICASONE-SALMETEROL 250-50 MCG/ACT IN AEPB: 1.0000 | INHALATION_SPRAY | Freq: Two times a day (BID) | RESPIRATORY_TRACT | 3 refills | Status: DC

## 2023-03-19 MED ORDER — TRIAMCINOLONE ACETONIDE 55 MCG/ACT NA AERO: 1.0000 | INHALATION_SPRAY | Freq: Every day | NASAL | 11 refills | Status: DC | PRN

## 2023-03-19 MED ORDER — OMEPRAZOLE 40 MG PO CPDR: 40.0000 mg | DELAYED_RELEASE_CAPSULE | Freq: Every day | ORAL | 3 refills | Status: DC

## 2023-03-19 NOTE — Progress Notes (Signed)
Subjective  CC:  Chief Complaint  Patient presents with   Hypertension    Pt here to f/u on blood pressure, Pt has cologuard kit and will work on it     HPI: Teresa Dunn is a 57 y.o. female who presents to the office today to address the problems listed above in the chief complaint. Hypertension f/u: Control is good . Pt reports she is doing well. taking medications as instructed, no medication side effects noted, no TIAs, no chest pain on exertion, no dyspnea on exertion, no swelling of ankles. Taking her lisinopril HCTZ twice daily dosing schedule due to blood pressures dropping low when she took them together.  I increased her dose at the last visit. She denies adverse effects from his BP medications. Compliance with medication is good.  Blood pressure is elevated today, she moved to [place] in July.  Life has been very stressful.  She drove 3 hours to get here.  Also having some abdominal pain.  Blood pressures at home have been normal. General anxiety disorder has been well-controlled on sertraline 50, however multiple new stressors.  No longer working.  Undergoing workup for bilateral inguinal pain in [place].  I reviewed notes.  Mild depressive symptoms are active given her move from [place] and her family. Health maintenance: Flu shot today.  Needs to do Cologuard.  She has since found it after her move and will get it done.  Assessment  1. Essential hypertension   2. Need for influenza vaccination   3. Generalized anxiety disorder   4. Major depression, recurrent, chronic (HCC)      Plan   Hypertension f/u: BP control is well controlled.  Continue current medications.  Recent CMP showed normal potassium and renal function.  ER evaluation. Anxiety and depression: Mildly active.  Will increase sertraline to 100 mg daily.  Counseling done. Refilled chronic medications to her new pharmacy in [place] Flu shot updated today Patient to get Cologuard  in.  Education regarding management of these chronic disease states was given. Management strategies discussed on successive visits include dietary and exercise recommendations, goals of achieving and maintaining IBW, and lifestyle modifications aiming for adequate sleep and minimizing stressors.   Follow up: May 2025 for complete physical  Orders Placed This Encounter  Procedures   Flu vaccine trivalent PF, 6mos and older(Flulaval,Afluria,Fluarix,Fluzone)   Meds ordered this encounter  Medications   lisinopril-hydrochlorothiazide (ZESTORETIC) 20-12.5 MG tablet    Sig: Take 2 tablets by mouth in the morning and at bedtime.   sertraline (ZOLOFT) 100 MG tablet    Sig: Take 1 tablet (100 mg total) by mouth daily.    Dispense:  90 tablet    Refill:  3   fluticasone-salmeterol (ADVAIR DISKUS) 250-50 MCG/ACT AEPB    Sig: Inhale 1 puff into the lungs in the morning and at bedtime.    Dispense:  180 each    Refill:  3   montelukast (SINGULAIR) 10 MG tablet    Sig: TAKE 1 TABLET(10 MG) BY MOUTH AT BEDTIME    Dispense:  90 tablet    Refill:  3   omeprazole (PRILOSEC) 40 MG capsule    Sig: Take 1 capsule (40 mg total) by mouth daily.    Dispense:  90 capsule    Refill:  3   triamcinolone (NASACORT ALLERGY 24HR) 55 MCG/ACT AERO nasal inhaler    Sig: Place 1 spray into the nose daily as needed (congestion/allergies.).    Dispense:  1 each    Refill:  11      BP Readings from Last 3 Encounters:  03/19/23 138/84  10/31/22 (!) 142/72  03/12/22 112/75   Wt Readings from Last 3 Encounters:  03/19/23 252 lb 3.2 oz (114.4 kg)  10/31/22 250 lb (113.4 kg)  03/12/22 250 lb (113.4 kg)    Lab Results  Component Value Date   CHOL 200 10/31/2022   CHOL 206 (H) 09/07/2021   CHOL 202 (H) 09/02/2020   Lab Results  Component Value Date   HDL 80.80 10/31/2022   HDL 101.40 09/07/2021   HDL 97.60 09/02/2020   Lab Results  Component Value Date   LDLCALC 109 (H) 10/31/2022   LDLCALC 96  09/07/2021   LDLCALC 95 09/02/2020   Lab Results  Component Value Date   TRIG 52.0 10/31/2022   TRIG 39.0 09/07/2021   TRIG 49.0 09/02/2020   Lab Results  Component Value Date   CHOLHDL 2 10/31/2022   CHOLHDL 2 09/07/2021   CHOLHDL 2 09/02/2020   No results found for: "LDLDIRECT" Lab Results  Component Value Date   CREATININE 0.69 10/31/2022   BUN 13 10/31/2022   NA 140 10/31/2022   K 4.4 10/31/2022   CL 103 10/31/2022   CO2 29 10/31/2022    The 10-year ASCVD risk score (Arnett DK, et al., 2019) is: 2.2%   Values used to calculate the score:     Age: 79 years     Sex: Female     Is Non-Hispanic African American: No     Diabetic: No     Tobacco smoker: No     Systolic Blood Pressure: 138 mmHg     Is BP treated: Yes     HDL Cholesterol: 80.8 mg/dL     Total Cholesterol: 200 mg/dL  I reviewed the patients updated PMH, FH, and SocHx.    Patient Active Problem List   Diagnosis Date Noted   Family history of breast cancer     Priority: High   Malignant neoplasm of upper-outer quadrant of right breast in female, estrogen receptor positive (HCC) 11/18/2019    Priority: High   Obesity (BMI 30-39.9) 03/27/2018    Priority: High   Essential hypertension 02/25/2018    Priority: High   Former heavy tobacco smoker 02/25/2018    Priority: High   Generalized anxiety disorder 10/10/2010    Priority: High   Moderate persistent asthma 02/25/2018    Priority: Medium    Chronic mixed headache syndrome 08/07/2012    Priority: Medium    GERD (gastroesophageal reflux disease) 06/26/2012    Priority: Medium    Major depression, recurrent, chronic (HCC) 10/23/2010    Priority: Medium    Vitamin B12 deficiency 03/09/2021    Priority: Low   History of total knee replacement, left 06/22/2020    Priority: Low   Primary osteoarthritis of right knee 09/02/2019    Priority: Low   Lipoma of abdominal wall 02/25/2018    Priority: Low   Plantar fasciitis of left foot 11/30/2016     Priority: Low   Perennial allergic rhinitis 08/07/2012    Priority: Low    Allergies: Codeine and Asa [aspirin]  Social History: Patient  reports that she quit smoking about 8 years ago. Her smoking use included cigarettes. She started smoking about 38 years ago. She has a 45 pack-year smoking history. She has never used smokeless tobacco. She reports current alcohol use. She reports that she does not use drugs.  No outpatient medications have been marked as taking for the 03/19/23 encounter (Office Visit) with Willow Ora, MD.    Review of Systems: Cardiovascular: negative for chest pain, palpitations, leg swelling, orthopnea Respiratory: negative for SOB, wheezing or persistent cough Gastrointestinal: negative for abdominal pain Genitourinary: negative for dysuria or gross hematuria  Objective  Vitals: BP 138/84   Pulse 64   Temp 98 F (36.7 C)   Ht 5\' 7"  (1.702 m)   Wt 252 lb 3.2 oz (114.4 kg)   LMP 06/27/2019 (Approximate)   SpO2 98%   BMI 39.50 kg/m  General: no acute distress  Psych:  Alert and oriented, normal mood and affect HEENT:  Normocephalic, atraumatic, supple neck  Cardiovascular:  RRR without murmur. no edema Respiratory:  Good breath sounds bilaterally, CTAB with normal respiratory effort Skin:  Warm, no rashes Neurologic:   Mental status is normal Commons side effects, risks, benefits, and alternatives for medications and treatment plan prescribed today were discussed, and the patient expressed understanding of the given instructions. Patient is instructed to call or message via MyChart if he/she has any questions or concerns regarding our treatment plan. No barriers to understanding were identified. We discussed Red Flag symptoms and signs in detail. Patient expressed understanding regarding what to do in case of urgent or emergency type symptoms.  Medication list was reconciled, printed and provided to the patient in AVS. Patient instructions and summary  information was reviewed with the patient as documented in the AVS. This note was prepared with assistance of Dragon voice recognition software. Occasional wrong-word or sound-a-like substitutions may have occurred due to the inherent limitation

## 2023-03-19 NOTE — Patient Instructions (Signed)
Please return in may 2025 for your complete physical.   I will release your lab results to you on your MyChart account with further instructions. You may see the results before I do, but when I review them I will send you a message with my report or have my assistant call you if things need to be discussed. Please reply to my message with any questions. Thank you!   If you have any questions or concerns, please don't hesitate to send me a message via MyChart or call the office at [phone]. Thank you for visiting with [place] today! It's our pleasure caring for you.

## 2023-05-24 ENCOUNTER — Telehealth: Payer: Self-pay | Admitting: Family Medicine

## 2023-05-24 ENCOUNTER — Other Ambulatory Visit: Payer: Self-pay

## 2023-05-24 MED ORDER — LISINOPRIL-HYDROCHLOROTHIAZIDE 20-12.5 MG PO TABS: 2.0000 | ORAL_TABLET | Freq: Every day | ORAL | 3 refills | Status: DC

## 2023-05-24 MED ORDER — MONTELUKAST SODIUM 10 MG PO TABS: ORAL_TABLET | ORAL | 3 refills | Status: DC

## 2023-05-24 MED ORDER — OMEPRAZOLE 40 MG PO CPDR: 40.0000 mg | DELAYED_RELEASE_CAPSULE | Freq: Every day | ORAL | 3 refills | Status: DC

## 2023-05-24 NOTE — Telephone Encounter (Signed)
Patient has changed Pharmacy (listed below and in Chart). Requests all RX's be 90 day supply and be sent to new Pharmacy:  Prescription Request  05/24/2023  LOV: 03/19/2023  What is the name of the medication or equipment?  sertraline (ZOLOFT) 100 MG tablet  montelukast (SINGULAIR) 10 MG tablet   Wixela inhaler  lisinopril-hydrochlorothiazide (ZESTORETIC) 20-12.5 MG tablet   omeprazole (PRILOSEC) 40 MG capsule    Have you contacted your pharmacy to request a refill? Yes   Which pharmacy would you like this sent to?  [facility] - [place], [place] - [address] [facility] AT Portal to Registered Caremark Sites [address] [place] [address] Phone: [phone] Fax: [phone]    Patient notified that their request is being sent to the clinical staff for review and that they should receive a response within 2 business days.   Please advise at Mobile [phone] (mobile)

## 2023-08-28 ENCOUNTER — Other Ambulatory Visit: Payer: Self-pay | Admitting: Family Medicine

## 2023-08-28 NOTE — Telephone Encounter (Signed)
 Copied from CRM [id number]. Topic: Clinical - Medication Refill >> Aug 28, 2023  9:44 AM Alcus Dad wrote: Most Recent Primary Care Visit:  Provider: Willow Ora  Department: [facility]  Visit Type: OFFICE VISIT  Date: 03/19/2023  Medication: fluticasone-salmeterol (ADVAIR DISKUS) 250-50 MCG/ACT AEPB  Has the patient contacted their pharmacy? Yes (Agent: If no, request that the patient contact the pharmacy for the refill. If patient does not wish to contact the pharmacy document the reason why and proceed with request.) (Agent: If yes, when and what did the pharmacy advise?)  Is this the correct pharmacy for this prescription? Yes If no, delete pharmacy and type the correct one.  This is the patient's preferred pharmacy:  [facility] - [place], [place] - [address] [facility] AT Portal to Registered Caremark Sites [address] [place] [address] Phone: [phone] Fax: [phone]   Has the prescription been filled recently? No  Is the patient out of the medication? No  Has the patient been seen for an appointment in the last year OR does the patient have an upcoming appointment? Yes  Can we respond through MyChart? Yes  Agent: Please be advised that Rx refills may take up to 3 business days. We ask that you follow-up with your pharmacy.

## 2023-09-06 ENCOUNTER — Other Ambulatory Visit: Payer: Self-pay | Admitting: Family Medicine

## 2023-10-07 ENCOUNTER — Other Ambulatory Visit: Payer: Self-pay | Admitting: Family Medicine

## 2023-11-04 ENCOUNTER — Encounter: Payer: Self-pay | Admitting: Family Medicine

## 2023-11-04 ENCOUNTER — Telehealth: Payer: Self-pay | Admitting: Family Medicine

## 2023-11-04 ENCOUNTER — Ambulatory Visit: Payer: 59 | Admitting: Family Medicine

## 2023-11-04 VITALS — BP 125/69 | HR 78 | Temp 97.7°F | Ht 67.0 in | Wt 252.4 lb

## 2023-11-04 DIAGNOSIS — J4540 Moderate persistent asthma, uncomplicated: Secondary | ICD-10-CM

## 2023-11-04 DIAGNOSIS — E669 Obesity, unspecified: Secondary | ICD-10-CM

## 2023-11-04 DIAGNOSIS — Z170 Estrogen receptor positive status [ER+]: Secondary | ICD-10-CM

## 2023-11-04 DIAGNOSIS — I10 Essential (primary) hypertension: Secondary | ICD-10-CM | POA: Diagnosis not present

## 2023-11-04 DIAGNOSIS — Z0000 Encounter for general adult medical examination without abnormal findings: Secondary | ICD-10-CM

## 2023-11-04 DIAGNOSIS — Z6839 Body mass index (BMI) 39.0-39.9, adult: Secondary | ICD-10-CM

## 2023-11-04 DIAGNOSIS — R7301 Impaired fasting glucose: Secondary | ICD-10-CM

## 2023-11-04 DIAGNOSIS — E538 Deficiency of other specified B group vitamins: Secondary | ICD-10-CM

## 2023-11-04 DIAGNOSIS — F339 Major depressive disorder, recurrent, unspecified: Secondary | ICD-10-CM | POA: Diagnosis not present

## 2023-11-04 DIAGNOSIS — Z0001 Encounter for general adult medical examination with abnormal findings: Secondary | ICD-10-CM

## 2023-11-04 DIAGNOSIS — F411 Generalized anxiety disorder: Secondary | ICD-10-CM

## 2023-11-04 DIAGNOSIS — Z87891 Personal history of nicotine dependence: Secondary | ICD-10-CM

## 2023-11-04 DIAGNOSIS — Z23 Encounter for immunization: Secondary | ICD-10-CM

## 2023-11-04 DIAGNOSIS — C50411 Malignant neoplasm of upper-outer quadrant of right female breast: Secondary | ICD-10-CM

## 2023-11-04 DIAGNOSIS — K635 Polyp of colon: Secondary | ICD-10-CM

## 2023-11-04 LAB — COMPREHENSIVE METABOLIC PANEL WITH GFR
ALT: 33 U/L (ref 0–35)
AST: 23 U/L (ref 0–37)
Albumin: 4.5 g/dL (ref 3.5–5.2)
Alkaline Phosphatase: 119 U/L — ABNORMAL HIGH (ref 39–117)
BUN: 22 mg/dL (ref 6–23)
CO2: 26 meq/L (ref 19–32)
Calcium: 10.1 mg/dL (ref 8.4–10.5)
Chloride: 95 meq/L — ABNORMAL LOW (ref 96–112)
Creatinine, Ser: 0.77 mg/dL (ref 0.40–1.20)
GFR: 86.14 mL/min (ref >60.00)
Glucose, Bld: 120 mg/dL — ABNORMAL HIGH (ref 70–99)
Potassium: 4.4 meq/L (ref 3.5–5.1)
Sodium: 131 meq/L — ABNORMAL LOW (ref 135–145)
Total Bilirubin: 0.7 mg/dL (ref 0.2–1.2)
Total Protein: 7.7 g/dL (ref 6.0–8.3)

## 2023-11-04 LAB — CBC WITH DIFFERENTIAL/PLATELET
Basophils Absolute: 0 10*3/uL (ref 0.0–0.1)
Basophils Relative: 1.1 % (ref 0.0–3.0)
Eosinophils Absolute: 0.1 10*3/uL (ref 0.0–0.7)
Eosinophils Relative: 2.3 % (ref 0.0–5.0)
HCT: 37.9 % (ref 36.0–46.0)
Hemoglobin: 12.9 g/dL (ref 12.0–15.0)
Lymphocytes Relative: 20.3 % (ref 12.0–46.0)
Lymphs Abs: 0.7 10*3/uL (ref 0.7–4.0)
MCHC: 34.1 g/dL (ref 30.0–36.0)
MCV: 89.7 fl (ref 78.0–100.0)
Monocytes Absolute: 0.3 10*3/uL (ref 0.1–1.0)
Monocytes Relative: 8.8 % (ref 3.0–12.0)
Neutro Abs: 2.3 10*3/uL (ref 1.4–7.7)
Neutrophils Relative %: 67.5 % (ref 43.0–77.0)
Platelets: 305 10*3/uL (ref 150.0–400.0)
RBC: 4.22 Mil/uL (ref 3.87–5.11)
RDW: 13.1 % (ref 11.5–15.5)
WBC: 3.3 10*3/uL — ABNORMAL LOW (ref 4.0–10.5)

## 2023-11-04 LAB — HEMOGLOBIN A1C: Hgb A1c MFr Bld: 6 % (ref 4.6–6.5)

## 2023-11-04 LAB — LIPID PANEL
Cholesterol: 238 mg/dL — ABNORMAL HIGH (ref 0–200)
HDL: 76.7 mg/dL (ref >39.00)
LDL Cholesterol: 140 mg/dL — ABNORMAL HIGH (ref 0–99)
NonHDL: 161.2
Total CHOL/HDL Ratio: 3
Triglycerides: 104 mg/dL (ref 0.0–149.0)
VLDL: 20.8 mg/dL (ref 0.0–40.0)

## 2023-11-04 NOTE — Progress Notes (Signed)
 Subjective  Chief Complaint  Patient presents with   Annual Exam    Pt here for Annual Exam and is currently fasting    Hypertension    HPI: Teresa Dunn is a 58 y.o. female who presents to [facility] [place] today for a Female Wellness Visit. She also has the concerns and/or needs as listed above in the chief complaint. These will be addressed in addition to the Health Maintenance Visit.   Wellness Visit: annual visit with health maintenance review and exam  HM: reviewed colonoscopy report from December.  Had it done in [place]  where she is living now.  Had some hyperplastic colon polyps.  This was done for colon cancer screening as well as right inguinal pain which is improved.  No clear source of pain was ever identified.  She is feeling better overall.  Mammogram and Pap smear are current.  She is getting used to living in [place] .  Working as a Haematologist.  Comes back to visit her children.  Quit smoking 10 years ago.  She remains eligible for lung cancer screening.  Eligible for Prevnar 20 today. Chronic disease f/u and/or acute problem visit: (deemed necessary to be done in addition to the wellness visit): Hypertension remains well-controlled on medication without chest pain shortness of breath or lower extremity edema. Breast cancer treated mammogram current. Obesity continues to be difficult to manage.  Weight is stable.  She has impaired fasting glucose.  Denies hypoglycemic symptoms.  Diet is poor.  Limited exercise due to right knee osteoarthritis.  She does need a knee replacement.  Has daily symptoms.  Has not yet established with an orthopedist down in [place]  Depression and anxiety are well-controlled on sertraline  100 mg daily.  She is doing very well in this regard.  No adverse effects Continues on B12 supplements due for recheck. Asthma: Doing very well on daily inhaler.  She did have a recent URI without a significant  asthmatic flare. Assessment  1. Encounter for well adult exam with abnormal findings   2. Essential hypertension   3. Former heavy tobacco smoker   4. Generalized anxiety disorder   5. Malignant neoplasm of upper-outer quadrant of right breast in female, estrogen receptor positive (HCC)   6. Obesity (BMI 30-39.9)   7. Major depression, recurrent, chronic (HCC)   8. Moderate persistent asthma without complication   9. Vitamin B12 deficiency   10. Hyperplastic colonic polyp, unspecified part of colon   11. IFG (impaired fasting glucose)   12. Need for pneumococcal 20-valent conjugate vaccination      Plan  Female Wellness Visit: Age appropriate Health Maintenance and Prevention measures were discussed with patient. Included topics are cancer screening recommendations, ways to keep healthy (see AVS) including dietary and exercise recommendations, regular eye and dental care, use of seat belts, and avoidance of moderate alcohol use and tobacco use.  BMI: discussed patient's BMI and encouraged positive lifestyle modifications to help get to or maintain a target BMI. HM needs and immunizations were addressed and ordered. See below for orders. See HM and immunization section for updates. Routine labs and screening tests ordered including cmp, cbc and lipids where appropriate. Discussed recommendations regarding Vit D and calcium supplementation (see AVS)  Chronic disease management visit and/or acute problem visit: Chronic problems are well-controlled including hypertension, anxiety and depression, B12 deficiency.  Will check lab work.  Continue current medications at current dosage. Refer for lung cancer screening given history  of smoking Prevnar updated today Recommend diet and weight loss.  Follow up: 12 months for complete physical and follow-up chronic problems Orders Placed This Encounter  Procedures   Pneumococcal conjugate vaccine 20-valent (Prevnar 20)   TSH   VITAMIN D 25  Hydroxy (Vit-D Deficiency, Fractures)   CBC with Differential/Platelet   Comprehensive metabolic panel with GFR   Lipid panel   Vitamin B12   Hemoglobin A1c   Ambulatory Referral for Lung Cancer Scre   No orders of the defined types were placed in this encounter.     Body mass index is 39.53 kg/m. Wt Readings from Last 3 Encounters:  11/04/23 252 lb 6.4 oz (114.5 kg)  03/19/23 252 lb 3.2 oz (114.4 kg)  10/31/22 250 lb (113.4 kg)     Patient Active Problem List   Diagnosis Date Noted Date Diagnosed   Family history of breast cancer      Priority: High   Malignant neoplasm of upper-outer quadrant of right breast in female, estrogen receptor positive (HCC) 11/18/2019     Priority: High   IFG (impaired fasting glucose) 05/19/2018     Priority: High   Obesity (BMI 30-39.9) 03/27/2018     Priority: High   Essential hypertension 02/25/2018     Priority: High   Former heavy tobacco smoker 02/25/2018     Priority: High    1.5 ppd x 30 years. Quit 2016    Generalized anxiety disorder 10/10/2010     Priority: High    cymbalta  - weight gain and hot flushes wellbutrin failed, started zoloft  January 2020.     Hyperplastic colon polyp 11/04/2023     Priority: Medium     Colonoscopy 05/2023, done in [place]. Dr. Rochelle Chu GI.     Moderate persistent asthma 02/25/2018     Priority: Medium    Chronic mixed headache syndrome 08/07/2012     Priority: Medium    GERD (gastroesophageal reflux disease) 06/26/2012     Priority: Medium    Major depression, recurrent, chronic (HCC) 10/23/2010     Priority: Medium    Vitamin B12 deficiency 03/09/2021     Priority: Low    Chronic ppi;rec oral b12 02/2021    History of total knee replacement, left 06/22/2020     Priority: Low   Primary osteoarthritis of right knee 09/02/2019     Priority: Low   Lipoma of abdominal wall 02/25/2018     Priority: Low   Plantar fasciitis of left foot 11/30/2016     Priority: Low   Perennial allergic rhinitis  08/07/2012     Priority: Low   Health Maintenance  Topic Date Due   Lung Cancer Screening  03/18/2024 (Originally 05/25/2017)   MAMMOGRAM  01/08/2024   INFLUENZA VACCINE  01/17/2024   Cervical Cancer Screening (HPV/Pap Cotest)  10/31/2027   DTaP/Tdap/Td (3 - Td or Tdap) 10/30/2032   Colonoscopy  05/21/2033   Pneumococcal Vaccine 63-24 Years old  Completed   Hepatitis C Screening  Completed   HIV Screening  Completed   Zoster Vaccines- Shingrix  Completed   HPV VACCINES  Aged Out   Meningococcal B Vaccine  Aged Out   COVID-19 Vaccine  Discontinued   Fecal DNA (Cologuard)  Discontinued   Immunization History  Administered Date(s) Administered   Influenza Split 03/26/2012   Influenza, Seasonal, Injecte, Preservative Fre 03/19/2023   Influenza,inj,Quad PF,6+ Mos 02/25/2018, 02/11/2019, 03/07/2020, 03/08/2021   Influenza,trivalent, recombinat, inj, PF 03/29/2011   PFIZER Comirnaty(Gray Top)Covid-19 Tri-Sucrose Vaccine 09/03/2020  PFIZER(Purple Top)SARS-COV-2 Vaccination 08/22/2019, 09/12/2019   PNEUMOCOCCAL CONJUGATE-20 11/04/2023   Pneumococcal Conjugate-13 06/27/2007   Tdap 06/27/2011, 10/31/2022   Zoster Recombinant(Shingrix) 09/02/2020, 03/08/2021   We updated and reviewed the patient's past history in detail and it is documented below. Allergies: Patient is allergic to codeine and asa [aspirin]. 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 Past Surgical  History Patient  has a past surgical history that includes Tubal ligation; Shoulder arthroscopy with subacromial decompression (Left, 11/18/2014); Knee surgery; Cervix lesion destruction (1990); Breast lumpectomy with radioactive seed and sentinel lymph node biopsy (Bilateral, 12/03/2019); Joint replacement; Breast biopsy (Right, 11/09/2019); Breast biopsy (Left, 11/11/2019); Breast lumpectomy (Right, 12/03/2019); Breast excisional biopsy (Left, 12/03/2019); and Knee arthroscopy w/ meniscectomy (Right, 09/2020). Family History: Patient family history includes ADD / ADHD in her son; Alcohol abuse in her mother; Asthma in her father; Bipolar disorder in her mother; Breast cancer in her maternal grandmother and paternal grandmother; Cancer in her maternal aunt; Cervical cancer in her sister; Diabetes in her father and mother; Healthy in her brother and son; Heart disease in her father and mother; Hypertension in her father and mother; Lung cancer in her maternal grandfather and mother; Pneumonia in her paternal uncle; Skin cancer in her sister; Stroke in her maternal grandmother. Social History:  Patient  reports that she quit smoking about 9 years ago. Her smoking use included cigarettes. She started smoking about 39 years ago. She has a 45 pack-year smoking history. She has never used smokeless tobacco. She reports current alcohol use. She reports that she does not use drugs.  Review of Systems: Constitutional: negative for fever or malaise Ophthalmic: negative for photophobia, double vision or loss of vision Cardiovascular: negative for chest pain, dyspnea on exertion, or new LE swelling Respiratory: negative for SOB or persistent cough Gastrointestinal: negative for abdominal pain, change in bowel habits or melena Genitourinary: negative for dysuria or gross hematuria, no abnormal uterine bleeding or disharge Musculoskeletal: negative for new gait disturbance or muscular weakness Integumentary:  negative for new or persistent rashes, no breast lumps Neurological: negative for TIA or stroke symptoms Psychiatric: negative for SI or delusions Allergic/Immunologic: negative for hives  Patient Care Team    Relationship Specialty Notifications Start End  Luevenia Saha, MD PCP - General Family Medicine  02/25/18   Sim Dryer, MD Consulting Physician General Surgery  11/17/19   Magrinat, Rozella Cornfield, MD (Inactive) Consulting Physician Oncology  11/25/19   Johna Myers, MD Consulting Physician Radiation Oncology  11/25/19   Auther Bo, RN Oncology Nurse Navigator   11/27/19   Alane Hsu, RN Oncology Nurse Navigator   11/27/19     Objective  Vitals: BP 125/69   Pulse 78   Temp 97.7 F (36.5 C)   Ht 5\' 7"  (1.702 m)   Wt 252 lb 6.4 oz (114.5 kg)   LMP 06/27/2019 (Approximate)   SpO2 96%   BMI 39.53 kg/m  General:  Well developed, well nourished, no acute distress  Psych:  Alert and orientedx3,normal mood and affect HEENT:  Normocephalic, atraumatic, non-icteric sclera,  supple neck without adenopathy,  mass or thyromegaly Cardiovascular:  Normal S1, S2, RRR without gallop, rub or murmur Respiratory:  Good breath sounds bilaterally, CTAB with normal respiratory effort Gastrointestinal: normal bowel sounds, soft, non-tender, no noted masses. No HSM MSK: extremities without edema, joints without erythema or swelling, limping due to right knee pain. Neurologic:    Mental status is normal.  Gross motor and sensory exams are normal.  No tremor  Commons side effects, risks, benefits, and alternatives for medications and treatment plan prescribed today were discussed, and the patient expressed understanding of the given instructions. Patient is instructed to call or message via MyChart if he/she has any questions or concerns regarding our treatment plan. No barriers to understanding were identified. We discussed Red Flag symptoms and signs in detail. Patient expressed understanding  regarding what to do in case of urgent or emergency type symptoms.  Medication list was reconciled, printed and provided to the patient in AVS. Patient instructions and summary information was reviewed with the patient as documented in the AVS. This note was prepared with assistance of Dragon voice recognition software. Occasional wrong-word or sound-a-like substitutions may have occurred due to the inherent limitations of voice recognition software

## 2023-11-04 NOTE — Telephone Encounter (Signed)
 Patient came in to CPE today . Would like to know if any medications will be refilled today . Please advise

## 2023-11-04 NOTE — Patient Instructions (Addendum)
 Please return in 12 months for your annual complete physical; please come fasting. And follow up chronic problems.  I will release your lab results to you on your MyChart account with further instructions. You may see the results before I do, but when I review them I will send you a message with my report or have my assistant call you if things need to be discussed. Please reply to my message with any questions. Thank you!   If you have any questions or concerns, please don't hesitate to send me a message via MyChart or call the office at [phone]. Thank you for visiting with us  today! It's our pleasure caring for you.    VISIT SUMMARY: During today's visit, we discussed your right knee pain due to end-stage arthritis, your well-controlled asthma, your history of smoking, and your borderline fasting blood sugar levels. We also reviewed your general health maintenance, including immunizations and screenings.  YOUR PLAN: -RIGHT KNEE OSTEOARTHRITIS, END STAGE: End-stage osteoarthritis is a severe form of arthritis where the cartilage in the knee is significantly worn down, causing pain and difficulty in movement. We will refer you to an orthopedic specialist in [place]  for further evaluation and management. Continue using a cane, walker, or crutches for support and keep icing your knee to help with the pain.  -ASTHMA: Asthma is a condition where your airways narrow and swell, producing extra mucus, which can make breathing difficult. Your asthma is well-controlled with no recent flare-ups.  -TOBACCO USE: Given your significant history of smoking, you qualify for an annual lung cancer screening. We will order a CT scan for this purpose and coordinate the scheduling at your preferred location.  -BORDERLINE FASTING BLOOD SUGAR: Borderline fasting blood sugar means your blood sugar levels are higher than normal but not high enough to be classified as diabetes. We will check your fasting blood  sugar levels during today's visit.  -GENERAL HEALTH MAINTENANCE: Your immunizations and screenings are up to date except for the pneumonia vaccination. We will administer the Prevnar 20 pneumonia vaccine today due to your asthma. Your Pap smear and mammogram are current, and we will ensure your lung cancer screening is scheduled.  INSTRUCTIONS: Please follow up with the orthopedic specialist in [place]  as soon as possible for your knee evaluation. Continue to monitor your blood sugar levels and maintain your current diet. We will schedule your CT scan for lung cancer screening and administer the Prevnar 20 pneumonia vaccine today.                      Contains text generated by Abridge.                                 Contains text generated by Abridge.

## 2023-11-05 ENCOUNTER — Other Ambulatory Visit: Payer: Self-pay

## 2023-11-05 LAB — VITAMIN D 25 HYDROXY (VIT D DEFICIENCY, FRACTURES): VITD: 37.64 ng/mL (ref 30.00–100.00)

## 2023-11-05 LAB — TSH: TSH: 1.75 u[IU]/mL (ref 0.35–5.50)

## 2023-11-05 LAB — VITAMIN B12: Vitamin B-12: 831 pg/mL (ref 211–911)

## 2023-11-05 MED ORDER — SERTRALINE HCL 100 MG PO TABS: 100.0000 mg | ORAL_TABLET | Freq: Every day | ORAL | 3 refills | Status: AC

## 2023-11-05 MED ORDER — LISINOPRIL-HYDROCHLOROTHIAZIDE 20-12.5 MG PO TABS: 2.0000 | ORAL_TABLET | Freq: Every day | ORAL | 2 refills | Status: AC

## 2023-11-05 MED ORDER — ALBUTEROL SULFATE HFA 108 (90 BASE) MCG/ACT IN AERS
1.0000 | INHALATION_SPRAY | RESPIRATORY_TRACT | 5 refills | Status: AC | PRN
Start: 2023-11-05 — End: ?

## 2023-11-05 MED ORDER — MONTELUKAST SODIUM 10 MG PO TABS
ORAL_TABLET | ORAL | 3 refills | Status: AC
Start: 2023-11-05 — End: ?

## 2023-11-05 MED ORDER — TRIAMCINOLONE ACETONIDE 55 MCG/ACT NA AERO: 1.0000 | INHALATION_SPRAY | Freq: Every day | NASAL | 11 refills | Status: AC | PRN

## 2023-11-05 MED ORDER — OMEPRAZOLE 40 MG PO CPDR: 40.0000 mg | DELAYED_RELEASE_CAPSULE | Freq: Every day | ORAL | 3 refills | Status: AC

## 2023-11-05 NOTE — Telephone Encounter (Signed)
 Rx sent.

## 2023-11-07 ENCOUNTER — Ambulatory Visit: Payer: Self-pay | Admitting: Family Medicine

## 2023-11-07 NOTE — Progress Notes (Signed)
 Mychart note sent to patient with instructions.  The 10-year ASCVD risk score (Arnett DK, et al., 2019) is: 2.5%   Values used to calculate the score:     Age: 58 years     Sex: Female     Is Non-Hispanic African American: No     Diabetic: No     Tobacco smoker: No     Systolic Blood Pressure: 125 mmHg     Is BP treated: Yes     HDL Cholesterol: 76.7 mg/dL     Total Cholesterol: 238 mg/dL

## 2024-11-09 ENCOUNTER — Encounter: Admitting: Family Medicine

## 2024-11-10 ENCOUNTER — Encounter: Admitting: Family Medicine
